# Patient Record
Sex: Male | Born: 1935 | Race: White | Hispanic: No | Smoking: Former smoker
Health system: Southern US, Community
[De-identification: ages and names within clinical notes are randomized; demographics above are authoritative.]

## PROBLEM LIST (undated history)

## (undated) DIAGNOSIS — K219 Gastro-esophageal reflux disease without esophagitis: Secondary | ICD-10-CM

## (undated) DIAGNOSIS — I10 Essential (primary) hypertension: Secondary | ICD-10-CM

## (undated) DIAGNOSIS — C801 Malignant (primary) neoplasm, unspecified: Secondary | ICD-10-CM

## (undated) DIAGNOSIS — G8929 Other chronic pain: Secondary | ICD-10-CM

## (undated) DIAGNOSIS — M1990 Unspecified osteoarthritis, unspecified site: Secondary | ICD-10-CM

## (undated) DIAGNOSIS — E785 Hyperlipidemia, unspecified: Secondary | ICD-10-CM

## (undated) DIAGNOSIS — F329 Major depressive disorder, single episode, unspecified: Secondary | ICD-10-CM

## (undated) DIAGNOSIS — N400 Enlarged prostate without lower urinary tract symptoms: Secondary | ICD-10-CM

## (undated) DIAGNOSIS — F32A Depression, unspecified: Secondary | ICD-10-CM

## (undated) DIAGNOSIS — M549 Dorsalgia, unspecified: Secondary | ICD-10-CM

## (undated) DIAGNOSIS — E119 Type 2 diabetes mellitus without complications: Secondary | ICD-10-CM

## (undated) HISTORY — PX: APPENDECTOMY: SHX54

## (undated) HISTORY — PX: SHOULDER SURGERY: SHX246

## (undated) HISTORY — DX: Depression, unspecified: F32.A

## (undated) HISTORY — DX: Hyperlipidemia, unspecified: E78.5

## (undated) HISTORY — DX: Unspecified osteoarthritis, unspecified site: M19.90

## (undated) HISTORY — PX: EYE SURGERY: SHX253

---
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
## 1998-09-09 ENCOUNTER — Ambulatory Visit (HOSPITAL_COMMUNITY): Admission: RE | Admit: 1998-09-09 | Discharge: 1998-09-09 | Payer: Self-pay | Admitting: *Deleted

## 2000-11-29 ENCOUNTER — Encounter: Payer: Self-pay | Admitting: Orthopedic Surgery

## 2000-11-29 ENCOUNTER — Ambulatory Visit: Admission: RE | Admit: 2000-11-29 | Discharge: 2000-11-29 | Payer: Self-pay | Admitting: Orthopedic Surgery

## 2001-01-20 ENCOUNTER — Ambulatory Visit (HOSPITAL_COMMUNITY): Admission: RE | Admit: 2001-01-20 | Discharge: 2001-01-20 | Payer: Self-pay | Admitting: Orthopedic Surgery

## 2001-01-27 ENCOUNTER — Observation Stay (HOSPITAL_COMMUNITY): Admission: RE | Admit: 2001-01-27 | Discharge: 2001-01-28 | Payer: Self-pay | Admitting: Orthopedic Surgery

## 2001-01-27 ENCOUNTER — Encounter (INDEPENDENT_AMBULATORY_CARE_PROVIDER_SITE_OTHER): Payer: Self-pay | Admitting: Specialist

## 2001-06-06 ENCOUNTER — Ambulatory Visit (HOSPITAL_COMMUNITY): Admission: RE | Admit: 2001-06-06 | Discharge: 2001-06-06 | Payer: Self-pay | Admitting: Ophthalmology

## 2007-09-12 ENCOUNTER — Encounter: Admission: RE | Admit: 2007-09-12 | Discharge: 2007-10-09 | Payer: Self-pay | Admitting: Internal Medicine

## 2008-06-05 ENCOUNTER — Ambulatory Visit: Payer: Self-pay | Admitting: Vascular Surgery

## 2008-12-03 ENCOUNTER — Encounter: Admission: RE | Admit: 2008-12-03 | Discharge: 2008-12-03 | Payer: Self-pay | Admitting: Neurological Surgery

## 2008-12-05 ENCOUNTER — Encounter: Admission: RE | Admit: 2008-12-05 | Discharge: 2009-01-07 | Payer: Self-pay | Admitting: Neurological Surgery

## 2009-06-17 ENCOUNTER — Encounter: Admission: RE | Admit: 2009-06-17 | Discharge: 2009-08-11 | Payer: Self-pay | Admitting: Neurological Surgery

## 2010-07-19 ENCOUNTER — Encounter: Payer: Self-pay | Admitting: Neurological Surgery

## 2010-11-10 NOTE — Procedures (Signed)
CAROTID DUPLEX EXAM   INDICATION:  Possible subclavian stenosis, blood pressure difference  from right to left.   HISTORY:  Diabetes:  No.  Cardiac:  No.  Hypertension:  Yes.  Smoking:  Previous.  Previous Surgery:  No.  CV History:  Asymptomatic.  Amaurosis Fugax No, Paresthesias No, Hemiparesis No                                       RIGHT             LEFT  Brachial systolic pressure:         164               154  Brachial Doppler waveforms:         Within normal limits                Within normal limits  Vertebral direction of flow:        Antegrade         Antegrade  DUPLEX VELOCITIES (cm/sec)  CCA peak systolic                   102               99  ECA peak systolic                   117               119  ICA peak systolic                   58                138  ICA end diastolic                   21                37  PLAQUE MORPHOLOGY:                  Mixed             Mixed  PLAQUE AMOUNT:                      Mild              Mild/moderate  PLAQUE LOCATION:                    ICA/ECA           ICA/ECA   IMPRESSION:  1. 1-39% stenosis of the right internal carotid artery.  2. 40-59% stenosis of the left internal carotid artery.  3. No evidence of increased velocities or abnormal waveform morphology      noted in the bilateral subclavian arteries.  4. The bilateral vertebral arteries are patent and antegrade.  5. No significant difference in the bilateral brachial pressures was      appreciated by use of hand Doppler.  6. Preliminary report was faxed to Dr. Laurey Morale office on 06/05/2008.      ___________________________________________  Larina Earthly, M.D.   CH/MEDQ  D:  06/05/2008  T:  06/05/2008  Job:  [id number]

## 2010-11-13 NOTE — Op Note (Signed)
[facility]  Patient:    Robert Beltran, Robert Beltran                       MRN: [id number] Proc. Date: 01/27/01 Adm. Date:  [id number] Disc. Date: [id number] Attending:  Marlowe Kays Page                           Operative Report  PREOPERATIVE DIAGNOSES: 1. Chronic impingement syndrome with partial and possible small full thickness rotator cuff tears, right shoulder. 2. 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  OPERATION: 1. Right shoulder arthroscopic subacromial decompression. 2. Glenohumeral arthroscopy, right shoulder, with extensive debridement and removal of multiple loose osteocartilaginous loose bodies.  SURGEON: Illene Labrador. Aplington, M.D.  ASSISTANT:  ______, PA-C.  ANESTHESIA:  General.  PATHOLOGIST:  ______  INDICATIONS:  He has pain and limitation of motion of his right shoulder. Plain x-rays demonstrated moderate glenohumeral arthritis with multiple fragments in and about the shoulder joint.  This was associated with a downsloping acromion and symptoms of rotator cuff disease.  An MRI has demonstrated rotator cuff tendinopathy with possible small full thickness tears and multiple synovial osteochondromatosis.  He is here today hopefully to do everything arthroscopically in terms of the above-mentioned surgery.  PROCEDURE:  After satisfactory general anesthesia in the beach chair position on the ______ frame, the right shoulder girdle was prepped with Duraprep and draped in a sterile field.  The anatomy of the shoulder was marked out with a marking pen, and a lateral portal, posterior soft spot portal, and subacromial space were all infiltrated with 0.5% Marcaine with adrenalin.  First through a posterior soft spot portal, I was able to atraumatically enter the glenohumeral joint.  Visualization was thoroughly obscured because of the fragments and all of the synovitis.  No fluid exited up into the  subacromial space, however, so I felt comfortable that we did not have any full thickness rotator cuff tears.  I consequently redirected the scope into the subacromial area and through the lateral portal introduced a 4.2 shaver and then began removing subdeltoid bursa, and when I had adequate visualization, brought in the Arthrocare vaporizer and removed soft tissue on the undersurface of the acromion and coracoacromial ligament.  I then introduced a 4-0 bur and began burring down not only the subacromial, but also the underneath surface of the distal clavicle, which appeared to be impinging on the rotator cuff.  I alternated back and forth between the bur, the Arthrocare vaporizer, and the 4.2 shaver until I felt we had thorough decompression of the subacromial joint.  Pictures were taken confirming this with the arm both to the side and in abduction.  I then evacuated fluid from the subacromial joint and then reinserted the scope into the glenohumeral joint.  Because I could not discern the normal anatomy, I simply advanced the scope to the anterior capsule, used a switching stick, and made an anterior portal there, and using rubber cannula introduced a 5-0 shaver and began debriding up the joint. We noticed the differentiation of the pathology with multiple osteochondral fragments in the joint, but probably totalling no more than about 6 with complete full thickness wear of the glenoid and moderate wear of the humeral head.  Most of the fragments seen on the MRI were clearly in the lining of the capsule and not creating a mechanical problem.  Consequently, I felt that our goal today was try  and remove all fragments which might be interfering with the glenohumeral function.  I tried various ways to grasp the bodies, some of which were several cm in size.  These were very firm and clearly bony with cartilaginous outer shell, and I could not bring them out through the anterior cannula and  could not fragment them, so I then introduced a bur and began burring them down with the difficulty being trying to find a solid surface to trap the fragments.  I piecemealed the fragments out as I fragmented them and removed the fragments with a pituitary.  We got down to the last several fragments, which I was having a difficult time fragmenting, and I then elected simply to remove the cannula anteriorly and remove the fragments through the enlarged anterior portal with long pituitaries.  At the conclusion of the case, I felt that all fragments from the glenohumeral joint which might be impinging and were loose had been completely removed.  Confirmatory pictures were taken.  Fluid was evacuated.  The posterior and lateral portals were closed with 4-0 nylon and the larger anterior portal with 3-0 nylon.  Betadine and Adaptic dressings and shoulder immobilizer were applied, and he tolerated the procedure well.  He returned to the recovery room in satisfactory condition with no known complications. DD:  01/27/01 TD:  01/30/01 Job: [id number]

## 2010-11-13 NOTE — Op Note (Signed)
[place]  Patient:    Robert Beltran, Robert Beltran Visit Number: [id number] MRN: [id number]          Service Type: DSU Location: [facility] 2899 [id number] Attending Physician:  Ivor Messier Dictated by:   Guadelupe Sabin, M.D. Proc. Date: 06/06/01 Admit Date:  06/06/2001   CC:         Rodrigo Ran, M.D., and any associated laboratory data, EKG,                           Operative Report  PREOPERATIVE DIAGNOSIS:  Senile brunescent nuclear cataract, left eye.  POSTOPERATIVE DIAGNOSIS:  Senile brunescent nuclear cataract, left eye.  PROCEDURE:  Planned extracapsular cataract extraction, phacoemulsification, primary insertion of posterior chamber intraocular lens implant.  SURGEON:  Guadelupe Sabin, M.D.  ASSISTANT:  Nurse.  ANESTHESIA:  Local 4% Xylocaine, 0.75% Marcaine retrobulbar block, topical Tetracaine, intraocular Xylocaine.  Anesthesia standby required.  Patient given sodium pentothal intravenously during the period of retrobulbar injection.  DESCRIPTION OF PROCEDURE:  After the patient was prepped and draped, a lid speculum was inserted in the left eye.  Schiotz tonometry was recorded at 6-7 scale units with a 5.5 g weight.  A peritomy was performed adjacent to the limbus from the 11 to 1 oclock position.  The corneoscleral junction was cleaned and a corneoscleral groove made with a 45 degree Superblade.  The anterior chamber was then entered with the 2.5 mm diamond keratome at the 12 oclock position and the 15 degree blade at the 2:30 position.  Using a bent 26-gauge needle on a Healon syringe, a circular capsulorhexis was begun and then completed with the Grabow forceps.  Hydrodissection and hydrodelineation were performed using 1% Xylocaine.  The lens nucleus was then flipped vertically in the anterior chamber and pupillary aperture.  The 30 degree phacoemulsification tip was then inserted with slow, controlled emulsification of the  extremely firm brunescent nucleus.  Lens chopping was required with the [facility] pick behind the lens, fragmenting it into smaller and smaller pieces. Following removal of nucleus, the small amount of residual cortex was aspirated with the irrigation-aspiration tip.  The posterior capsule appeared intact with a brilliant red fundus reflex.  It was therefore elected to insert an [employer] [id number] silicone three-piece posterior chamber intraocular lens implant with UV absorber.  Diopter strength +19.00.  This was inserted with a McDonald forceps in the anterior chamber and then centered into the capsular bag using the [facility] lens rotator.  The lens appeared to be well-centered.  The Healon which had been used during the procedure was then aspirated and replaced with balanced salt solution and Miochol ophthalmic solution.  The operative incisions appeared to be self-sealing, and no sutures were required.  A light patch and protective shield were applied to the operated left eye.  Duration of procedure and anesthesia administration 45 minutes.  The patient tolerated the procedure well in general, left the operating room for the recovery room in good condition. Dictated by:   Guadelupe Sabin, M.D. Attending Physician:  Ivor Messier DD:  06/06/01 TD:  06/06/01 Job: [id number]

## 2010-11-13 NOTE — H&P (Signed)
[facility]  Patient:    Robert, Beltran                         MRN: [id number] Adm. Date:  12/05/00 Attending:  Fayrene Fearing P. Aplington, M.D. Dictator:   Colleen P. Mahar, P.A. CC:         Rodrigo Ran, M.D.   History and Physical  DATE OF BIRTH:  [birth year]  CHIEF COMPLAINT:  Right shoulder pain.  HISTORY OF PRESENT ILLNESS:  The patient has had 8-10 year history of right shoulder pain which began when lifting a heavy object.  It has come and gone with his pain and disability over the 8-10 years.  However, earlier this year, in about January or February, it has become more consistent with increasing pain with significant pain whenever he raises his arm above his head, particularly with abduction of the arm.  He states that as long as his elbow is at his side he gets along fine.  An MRI of his right shoulder on Nov 01, 2000, demonstrated advanced glenohumeral degenerative disease, but also the joint was found to be packed with intra-articular loose bodies consistent with secondary osteochondromatosis.  He has arthropathy of the rotator cuff with partial thickness tear and possibly a few small full thickness tears, but no retraction.  The patient discussed at length with Dr. Simonne Come his treatment options at this point, and they were in agreement that jumping into a hemiarthroplasty initially was not the best option, and they should start with an arthroscopy of the shoulder to allow Dr. Simonne Come to shave down the rotator cuff, perform a subacromial decompression, and possibly remove all the loose bodies.  The risks were discussed with the patient and his wife, and they both understood and wanted to proceed with the shoulder arthroscopy.  PAST MEDICAL HISTORY:  Significant for hypercholesterolemia and benign prostate hypertrophy.  PAST SURGICAL HISTORY:  Significant for testicular cancer in 1964, appendectomy in 1995, and tonsillectomy at age  44.  SOCIAL HISTORY:  The patient denies tobacco use.  Alcohol:  Drinks approximately one glass of red wine per day.  The patient is married.  FAMILY HISTORY:  The patients mother deceased at age 24 from coronary artery disease.  Father is deceased at age 63 with history of MI and coronary artery disease.  ALLERGIES:  The patient cannot tolerate ASPIRIN or NSAIDS.  He has a history of GI bleed secondary to the use of these.  MEDICATIONS: 1. Lopid 600 mg 1 tablet b.i.d. 2. Lipitor 10 mg 1 tablet q.h.s. 3. Wellbutrin SR 150 mg 1 tablet q.d.  REVIEW OF SYSTEMS:  GENERAL:  Negative for fevers or chills, night sweats, or bleeding tendencies.  CNS:  Negative for blurred vision, double vision, seizures, headaches, or paralysis.  PULMONARY:  Negative for shortness of breath, productive cough, or hemoptysis.  CARDIOVASCULAR:  Negative for chest pain, angina, orthopnea, or claudication.  GI:  Negative for nausea or vomiting, constipation, diarrhea, melena, or bloody stool.  GU:  Negative for dysuria, hematuria, discharge.  MUSCULOSKELETAL:  As per HPI.  PHYSICAL EXAMINATION:  VITAL SIGNS:  Blood pressure 140/78, respirations 16 and unlabored, and pulse 64 and regular.  GENERAL:  A 77 year old male who is alert and oriented, pleasant, and cooperative to the exam and in no acute distress.  HEENT:  Head is normocephalic, nontraumatic.  Extraocular movement is intact.  NECK:  Soft to palpation and no bruits are appreciated.  LUNGS:  Clear to auscultation bilaterally.  No rales, rhonchi, stridor, wheezes, or friction rubs.  BREASTS:  Not performed.  HEART:  Normal S1, S2, with regular rate and rhythm.  However, I did appreciate a 1-2/6 blowing-type murmur heard best at the fifth intercostal space midclavicular line, which the patient denied having a history of.  ABDOMEN:  Positive bowel sounds throughout.  Soft to palpation.  No masses appreciated.  Nontender, nondistended.  GU:   Not performed, not pertinent.  EXTREMITIES:  The right shoulder has a decreased range of motion secondary to pain above 90 degrees and also with some weakness to the right shoulder during active range of motion.  Right shoulder has a limited range of motion. External rotation is limited to about 10-15 degrees.  Internal rotation is also limited.  He is able to put his hands behind his head, but it is more difficult on the right.  He has internal rotation to about his belt line compared to his left asymptomatic side, which is about T6 level.  Drop arm test is negative bilaterally.  Isolated supraspinatus muscle testing does cause a familiar right shoulder pain.  Reflexes are 2+ to both upper extremities.  SKIN:  Intact with no erythema or rashes present.  IMPRESSION: 1. Right shoulder osteoarthritis with loose bodies, osteochondromatosis, and    a partial thickness rotator cuff tear. 2. Hypercholesterolemia. 3. Benign prostatic hypertrophy. 4. Mild depression.  PLAN:  Admit to [facility] on December 05, 2000, to Dr. Simonne Come for a right shoulder arthroscopy with shaving of rotator cuff, removal of loose bodies, and subacromial decompression arthroscopically.  I contacted the patients primary care physician, who is Dr. Rodrigo Ran, and I spoke with a nurse there that works with him and informed her about the murmur that I appreciated during the physical examination.  I asked them what they would like to do for followup for this patient.  They contacted the patient and they are going to follow up with an EKG and workup his heart murmur prior to surgery and prior to medical clearance, so this clearance is pending.  Will fax it to [facility] upon receiving that. DD:  11/29/00 TD:  11/29/00 Job: [id number]

## 2010-11-13 NOTE — H&P (Signed)
[facility]  Patient:    Robert Beltran, Robert Beltran                         MRN: [id number] Adm. Date:  01/27/01 Attending:  Fayrene Fearing P. Aplington, M.D. Dictator:   Druscilla Brownie. Shela Nevin, P.A. CC:         Peter M. [place], M.D.  Rodrigo Ran, M.D.   History and Physical  ADDENDUM  DATE OF BIRTH:  Nov 07, 1923  As the patient was noticed to have a new diagnosed murmur on his history and physical of December 05, 2000, it was felt that he had a complete cardiac workup. He saw Dr. Peter M. [place], as well as Dr. Rodrigo Ran, and a cardiac and medical workup was obtained.  A Cardiolite profusion study was normal and he had normal left ventricular function; however, he did have new onset of a new left bundle branch block by EKG.  With this clearance, he is scheduled to go ahead with the surgery of right shoulder arthroscopy and shaving the rotator cuff, subacromial decompression, removal of loose bodies, and probable arthrotomy. DD:  01/17/01 TD:  01/17/01 Job: [id number]

## 2010-11-13 NOTE — H&P (Signed)
[place]  Patient:    Robert Beltran, Robert Beltran Visit Number: [id number] MRN: [id number]          Service Type: DSU Location: [facility] 2899 21 Attending Physician:  Ivor Messier Dictated by:   Guadelupe Sabin, M.D. Admit Date:  06/06/2001   CC:         Rodrigo Ran, M.D., and any associated laboratory data, EKG,                         History and Physical  REASON FOR ADMISSION:  This was a planned outpatient surgical admission of this 77 year old white male admitted for cataract implant surgery of the left eye.  HISTORY OF PRESENT ILLNESS:  This patient and his family have been followed in my office for many years.  The patient was first seen on January 16, 1996, at which time a brunescent nuclear cataract formation was noted in both eyes. Visual acuity, however, remained good at 20/40 right eye, 20/30 left eye.  The patient was followed intermittently since that time with refraction changes. When he recently returned to the office, it was felt that the vision had deteriorated and vision had decreased to 20/50 -2 in the left eye.  The brunescent nucleus was felt to be extremely firm in nature, and cataract surgery was suggested and indicated.  The patient agreed and signed an informed consent and arrangements made for his outpatient admission at this time.  PAST MEDICAL HISTORY:  The patient is under the care of Dr. Waynard Edwards.  The patient is felt to be in stable general condition.  MEDICATIONS:  The patient currently is taking saw palmetto, Wellbutrin, Lopid, Lipitor, vitamin E, and MVI under Dr. Therisa Doyne instruction.  REVIEW OF SYSTEMS:  No cardiorespiratory complaints.  PHYSICAL EXAMINATION:  VITAL SIGNS:  As recorded on admission, blood pressure 158/79, temperature 97.2, pulse 60, respirations 18.  GENERAL:  The patient is a pleasant, well-nourished, well-developed 77 year old white male in no acute distress.  HEENT:  Eyes:  External  ocular:  The eyes are white and clear.  Slit lamp examination:  Bilateral brunescent nuclear cataract formation.  Detailed fundus examination:  Clear vitreous, right eye and left eye.  Retina is attached.  Normal optic nerve, blood vessels, and macula.  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  CHEST:  Lungs clear to percussion and auscultation.  CARDIAC:  Normal sinus rhythm.  No cardiomegaly, no murmurs.  ABDOMEN:  Negative.  EXTREMITIES:  Negative.  ADMISSION DIAGNOSIS:  Senile cataract, left and right eye.  SURGICAL PLAN:  Cataract implant surgery left eye now, right eye later. Dictated by:   Guadelupe Sabin, M.D. Attending Physician:  Ivor Messier DD:  06/06/01 TD:  06/06/01 Job: [id number]

## 2012-07-24 ENCOUNTER — Other Ambulatory Visit: Payer: Self-pay | Admitting: Neurological Surgery

## 2012-07-24 DIAGNOSIS — R269 Unspecified abnormalities of gait and mobility: Secondary | ICD-10-CM

## 2012-07-24 DIAGNOSIS — M48061 Spinal stenosis, lumbar region without neurogenic claudication: Secondary | ICD-10-CM

## 2012-07-31 ENCOUNTER — Ambulatory Visit
Admission: RE | Admit: 2012-07-31 | Discharge: 2012-07-31 | Disposition: A | Discharge status: Home / Self Care | Payer: Medicare Other | Source: Ambulatory Visit | Attending: Neurological Surgery | Admitting: Neurological Surgery

## 2012-07-31 DIAGNOSIS — M48061 Spinal stenosis, lumbar region without neurogenic claudication: Secondary | ICD-10-CM

## 2012-07-31 DIAGNOSIS — R269 Unspecified abnormalities of gait and mobility: Secondary | ICD-10-CM

## 2012-08-07 ENCOUNTER — Other Ambulatory Visit: Payer: Self-pay | Admitting: Neurological Surgery

## 2012-08-11 ENCOUNTER — Institutional Professional Consult (permissible substitution): Payer: Medicare Other | Admitting: Cardiovascular Disease

## 2012-08-21 ENCOUNTER — Ambulatory Visit (INDEPENDENT_AMBULATORY_CARE_PROVIDER_SITE_OTHER): Payer: Medicare Other | Admitting: Cardiovascular Disease

## 2012-08-21 ENCOUNTER — Encounter: Payer: Self-pay | Admitting: *Deleted

## 2012-08-21 VITALS — BP 117/59 | HR 70 | Ht 66.0 in | Wt 170.0 lb

## 2012-08-21 DIAGNOSIS — Z01818 Encounter for other preprocedural examination: Secondary | ICD-10-CM

## 2012-08-21 DIAGNOSIS — I447 Left bundle-branch block, unspecified: Secondary | ICD-10-CM

## 2012-08-21 NOTE — Assessment & Plan Note (Signed)
Inocente presents today for preoperative evaluation. He does not have any cardiac complaints but does have a left bundle branch block.  At this point I would like to do an echocardiogram. If he has well-preserved left ventricular systolic function, then I think that he can safely proceed with his back surgery at minimal risk. If he has significant left ventricular dysfunction then we may need to perform further tests including a stress Myoview study. I'll see him back on an as-needed basis depending on the results of the echocardiogram.

## 2012-08-21 NOTE — Progress Notes (Signed)
     Robert Beltran Date of Birth  March 04, 1924       [facility]    [place] [address]  [address] [place], [place]  [address]   [place], [place]  [address] [phone]     [phone]   Fax  [phone]    Fax [phone]  Problem List: 1. Left bundle branch block 2. Spinal stenosis  History of Present Illness:  Robert Beltran is an 79 yo who presented for pre-op evaluation prior having back surgery. He has a left bundle branch block at baseline. He's never had episodes of chest pain or shortness of breath. He is retired from [employer].  He's quite limited by his back and leg pain. He has spinal stenosis which limits him. Prior to the acute worsening of his spinal stenosis he was exercising 2-3 times a week a regular basis.  No current outpatient prescriptions on file prior to visit.   No current facility-administered medications on file prior to visit.    Allergies not on file  No past medical history on file.  No past surgical history on file.  History  Smoking status  . Former Smoker  Smokeless tobacco  . Not on file    History  Alcohol Use: Not on file    No family history on file.  Reviw of Systems:  Reviewed in the HPI.  All other systems are negative.  Physical Exam: Blood pressure 117/59, pulse 70, height 5\' 6"  (1.676 m), weight 170 lb (77.111 kg). General: Well developed, well nourished, in no acute distress.  He was examined in the wheelchair.  Head: Normocephalic, atraumatic, sclera non-icteric, mucus membranes are moist,   Neck: Supple. Carotids are 2 + without bruits. No JVD   Lungs: Clear   Heart: Regular rate, normal S1 and normal S2  Abdomen: Soft, non-tender, non-distended with normal bowel sounds.  Msk:  Strength and tone are normal   Extremities: No clubbing or cyanosis. No edema.  Distal pedal pulses are 2+ and equal    Neuro: CN II - XII intact.  Alert and oriented X 3.   Psych:   Normal   ECG: Feb. 24, 2014:  NSR at 73.  LBBB.  Assessment / Plan:

## 2012-08-21 NOTE — Patient Instructions (Addendum)
Your physician has requested that you have an echocardiogram. Echocardiography is a painless test that uses sound waves to create images of your heart. It provides your doctor with information about the size and shape of your heart and how well your heart's chambers and valves are working. This procedure takes approximately one hour. There are no restrictions for this procedure.   Your physician recommends that you schedule a follow-up appointment in: AS NEEDED 

## 2012-08-23 ENCOUNTER — Encounter (HOSPITAL_COMMUNITY): Payer: Self-pay | Admitting: Pharmacy Technician

## 2012-08-25 ENCOUNTER — Ambulatory Visit (HOSPITAL_COMMUNITY): Payer: Medicare Other | Attending: Cardiovascular Disease

## 2012-08-25 DIAGNOSIS — Z01818 Encounter for other preprocedural examination: Secondary | ICD-10-CM | POA: Insufficient documentation

## 2012-08-25 DIAGNOSIS — I447 Left bundle-branch block, unspecified: Secondary | ICD-10-CM

## 2012-08-25 MED ORDER — PERFLUTREN PROTEIN A MICROSPH IV SUSP
2.0000 mL | Freq: Once | INTRAVENOUS | Status: AC
  Administered 2012-08-25: 2 mL via INTRAVENOUS

## 2012-08-25 NOTE — Progress Notes (Addendum)
Echocardiogram performed with [employer].  

## 2012-08-25 NOTE — Progress Notes (Signed)
Echocardiogram performed.  

## 2012-08-25 NOTE — Addendum Note (Signed)
Addended by: Oren Bracket on: 08/25/2012 04:25 PM   Modules accepted: Orders

## 2012-08-28 ENCOUNTER — Encounter (HOSPITAL_COMMUNITY): Payer: Self-pay

## 2012-08-28 MED ORDER — CEFAZOLIN SODIUM-DEXTROSE 2-3 GM-% IV SOLR
2.0000 g | INTRAVENOUS | Status: AC
  Administered 2012-08-29: 2 g via INTRAVENOUS

## 2012-08-28 MED ORDER — DEXAMETHASONE SODIUM PHOSPHATE 10 MG/ML IJ SOLN: 10.0000 mg | INTRAMUSCULAR | Status: DC

## 2012-08-28 NOTE — Progress Notes (Signed)
Anesthesia chart review: Patient is a 80 year old male posted for a 2 level lumbar laminectomy/decompression/microdiskectomy on 08/29/12 by Dr. Yetta Barre.  He is scheduled to be a same day work-up.   No history is currently listed, but he was seen by Cardiologist Dr. Elease Hashimoto for a pre-operative evaluation on 08/21/12.  EKG that day showed NSR, left BBB.  He ordered a preoperative echocardiogram with the following thoughts, "If he has well-preserved left ventricular systolic function, then I think that he can safely proceed with his back surgery at minimal risk. If he has significant left ventricular dysfunction then we may need to perform further tests including a stress Myoview study. I'll see him back on an as-needed basis depending on the results of the echocardiogram."  The echo was just performed on 08/25/12.  Results showed: - Left ventricle: WIth Definity LVEF appears normal at 60%. The cavity size was mildly reduced. Wall thickness was increased in a pattern of mild LVH. Doppler parameters are consistent with abnormal left ventricular relaxation (grade 1 diastolic dysfunction). - Aortic valve: Trivial regurgitation. - Mitral valve: Calcified annulus. Mildly thickened leaflets. Mild regurgitation. [facility] closed early today due to inclement weather.  Erie Noe at [facility] plans to page [facility] card master for additional input, but based on normal EF and Dr. Harvie Bridge previous assessment/recommendations it would appear that patient meets Dr. Harvie Bridge criteria to proceed with surgery--pending other pre-operative testing results are unremarkable.    Shonna Chock, PA-C 08/28/12 1541

## 2012-08-29 ENCOUNTER — Inpatient Hospital Stay (HOSPITAL_COMMUNITY): Payer: Medicare Other

## 2012-08-29 ENCOUNTER — Encounter (HOSPITAL_COMMUNITY)
Admission: RE | Disposition: A | Discharge status: Skilled Nursing Facility | Payer: Self-pay | Source: Ambulatory Visit | Attending: Neurological Surgery

## 2012-08-29 ENCOUNTER — Encounter (HOSPITAL_COMMUNITY): Payer: Self-pay | Admitting: Neurological Surgery

## 2012-08-29 ENCOUNTER — Encounter (HOSPITAL_COMMUNITY): Payer: Self-pay | Admitting: Vascular Surgery

## 2012-08-29 ENCOUNTER — Inpatient Hospital Stay (HOSPITAL_COMMUNITY)
Admission: RE | Admit: 2012-08-29 | Discharge: 2012-09-07 | DRG: 491 | Disposition: A | Discharge status: Skilled Nursing Facility | Payer: Medicare Other | Source: Ambulatory Visit | Attending: Neurological Surgery | Admitting: Neurological Surgery

## 2012-08-29 ENCOUNTER — Inpatient Hospital Stay (HOSPITAL_COMMUNITY): Payer: Medicare Other | Admitting: Vascular Surgery

## 2012-08-29 DIAGNOSIS — M4714 Other spondylosis with myelopathy, thoracic region: Principal | ICD-10-CM | POA: Diagnosis present

## 2012-08-29 DIAGNOSIS — Z79899 Other long term (current) drug therapy: Secondary | ICD-10-CM

## 2012-08-29 DIAGNOSIS — Z8547 Personal history of malignant neoplasm of testis: Secondary | ICD-10-CM

## 2012-08-29 DIAGNOSIS — E119 Type 2 diabetes mellitus without complications: Secondary | ICD-10-CM | POA: Diagnosis present

## 2012-08-29 DIAGNOSIS — K219 Gastro-esophageal reflux disease without esophagitis: Secondary | ICD-10-CM | POA: Diagnosis present

## 2012-08-29 DIAGNOSIS — N400 Enlarged prostate without lower urinary tract symptoms: Secondary | ICD-10-CM | POA: Diagnosis present

## 2012-08-29 DIAGNOSIS — I10 Essential (primary) hypertension: Secondary | ICD-10-CM | POA: Diagnosis present

## 2012-08-29 DIAGNOSIS — M4804 Spinal stenosis, thoracic region: Secondary | ICD-10-CM

## 2012-08-29 HISTORY — DX: Benign prostatic hyperplasia without lower urinary tract symptoms: N40.0

## 2012-08-29 HISTORY — DX: Dorsalgia, unspecified: M54.9

## 2012-08-29 HISTORY — DX: Essential (primary) hypertension: I10

## 2012-08-29 HISTORY — DX: Other chronic pain: G89.29

## 2012-08-29 HISTORY — DX: Type 2 diabetes mellitus without complications: E11.9

## 2012-08-29 HISTORY — DX: Unspecified osteoarthritis, unspecified site: M19.90

## 2012-08-29 HISTORY — DX: Gastro-esophageal reflux disease without esophagitis: K21.9

## 2012-08-29 HISTORY — DX: Malignant (primary) neoplasm, unspecified: C80.1

## 2012-08-29 HISTORY — PX: LUMBAR LAMINECTOMY/DECOMPRESSION MICRODISCECTOMY: SHX5026

## 2012-08-29 LAB — CBC WITH DIFFERENTIAL/PLATELET
Basophils Absolute: 0 10*3/uL (ref 0.0–0.1)
Eosinophils Relative: 1 % (ref 0–5)
Lymphocytes Relative: 12 % (ref 12–46)
Lymphs Abs: 1 10*3/uL (ref 0.7–4.0)
MCV: 94.3 fL (ref 78.0–100.0)
Neutro Abs: 7.3 10*3/uL (ref 1.7–7.7)
Platelets: 168 10*3/uL (ref 150–400)
RBC: 5.05 MIL/uL (ref 4.22–5.81)
RDW: 14.5 % (ref 11.5–15.5)
WBC: 9.1 10*3/uL (ref 4.0–10.5)

## 2012-08-29 LAB — GLUCOSE, CAPILLARY: Glucose-Capillary: 125 mg/dL — ABNORMAL HIGH (ref 70–99)

## 2012-08-29 LAB — BASIC METABOLIC PANEL
CO2: 25 mEq/L (ref 19–32)
Calcium: 9.1 mg/dL (ref 8.4–10.5)
Chloride: 101 mEq/L (ref 96–112)
GFR calc Af Amer: 62 mL/min — ABNORMAL LOW (ref >90)
Sodium: 137 mEq/L (ref 135–145)

## 2012-08-29 LAB — PROTIME-INR
INR: 0.94 (ref 0.00–1.49)
Prothrombin Time: 12.5 seconds (ref 11.6–15.2)

## 2012-08-29 LAB — SURGICAL PCR SCREEN
MRSA, PCR: NEGATIVE
Staphylococcus aureus: NEGATIVE

## 2012-08-29 SURGERY — LUMBAR LAMINECTOMY/DECOMPRESSION MICRODISCECTOMY 2 LEVELS
Anesthesia: General | Site: Back | Wound class: Clean

## 2012-08-29 MED ORDER — DEXAMETHASONE SODIUM PHOSPHATE 10 MG/ML IJ SOLN
INTRAMUSCULAR | Status: DC | PRN
  Administered 2012-08-29: 10 mg via INTRAVENOUS

## 2012-08-29 MED ORDER — MORPHINE SULFATE 2 MG/ML IJ SOLN
2.0000 mg | INTRAMUSCULAR | Status: DC | PRN
  Administered 2012-08-29 – 2012-08-31 (×3): 2 mg via INTRAVENOUS
  Filled 2012-08-29 (×3): qty 1

## 2012-08-29 MED ORDER — PHENOL 1.4 % MT LIQD: 1.0000 | OROMUCOSAL | Status: DC | PRN

## 2012-08-29 MED ORDER — MUPIROCIN 2 % EX OINT
TOPICAL_OINTMENT | CUTANEOUS | Status: AC
  Administered 2012-08-29 (×2): 1
  Filled 2012-08-29: qty 22

## 2012-08-29 MED ORDER — ROCURONIUM BROMIDE 100 MG/10ML IV SOLN
INTRAVENOUS | Status: DC | PRN
  Administered 2012-08-29: 50 mg via INTRAVENOUS

## 2012-08-29 MED ORDER — ONDANSETRON HCL 4 MG/2ML IJ SOLN
INTRAMUSCULAR | Status: DC | PRN
  Administered 2012-08-29: 4 mg via INTRAVENOUS

## 2012-08-29 MED ORDER — 0.9 % SODIUM CHLORIDE (POUR BTL) OPTIME
TOPICAL | Status: DC | PRN
  Administered 2012-08-29: 1000 mL

## 2012-08-29 MED ORDER — MIDAZOLAM HCL 5 MG/5ML IJ SOLN
INTRAMUSCULAR | Status: DC | PRN
  Administered 2012-08-29: 1 mg via INTRAVENOUS

## 2012-08-29 MED ORDER — CEFAZOLIN SODIUM-DEXTROSE 2-3 GM-% IV SOLR
INTRAVENOUS | Status: AC
  Filled 2012-08-29: qty 50

## 2012-08-29 MED ORDER — CEFAZOLIN SODIUM 1-5 GM-% IV SOLN
1.0000 g | Freq: Three times a day (TID) | INTRAVENOUS | Status: AC
  Administered 2012-08-29 – 2012-08-30 (×2): 1 g via INTRAVENOUS
  Filled 2012-08-29 (×2): qty 50

## 2012-08-29 MED ORDER — BACITRACIN 50000 UNITS IM SOLR
INTRAMUSCULAR | Status: AC
  Filled 2012-08-29: qty 1

## 2012-08-29 MED ORDER — METFORMIN HCL 500 MG PO TABS
500.0000 mg | ORAL_TABLET | Freq: Two times a day (BID) | ORAL | Status: DC
  Administered 2012-08-29 – 2012-09-07 (×18): 500 mg via ORAL
  Filled 2012-08-29 (×23): qty 1

## 2012-08-29 MED ORDER — ACETAMINOPHEN 650 MG RE SUPP: 650.0000 mg | RECTAL | Status: DC | PRN

## 2012-08-29 MED ORDER — HEMOSTATIC AGENTS (NO CHARGE) OPTIME
TOPICAL | Status: DC | PRN
  Administered 2012-08-29: 1 via TOPICAL

## 2012-08-29 MED ORDER — HYDROCODONE-ACETAMINOPHEN 5-325 MG PO TABS
ORAL_TABLET | ORAL | Status: AC
  Filled 2012-08-29: qty 2

## 2012-08-29 MED ORDER — SODIUM CHLORIDE 0.9 % IJ SOLN
3.0000 mL | Freq: Two times a day (BID) | INTRAMUSCULAR | Status: DC
  Administered 2012-08-29 – 2012-09-04 (×12): 3 mL via INTRAVENOUS

## 2012-08-29 MED ORDER — HYDROMORPHONE HCL PF 1 MG/ML IJ SOLN
0.2500 mg | INTRAMUSCULAR | Status: DC | PRN
  Administered 2012-08-29 (×2): 0.5 mg via INTRAVENOUS

## 2012-08-29 MED ORDER — CHLORTHALIDONE 25 MG PO TABS
25.0000 mg | ORAL_TABLET | Freq: Every day | ORAL | Status: DC
  Administered 2012-08-29 – 2012-09-07 (×10): 25 mg via ORAL
  Filled 2012-08-29 (×10): qty 1

## 2012-08-29 MED ORDER — ONDANSETRON HCL 4 MG/2ML IJ SOLN: 4.0000 mg | INTRAMUSCULAR | Status: DC | PRN

## 2012-08-29 MED ORDER — SODIUM CHLORIDE 0.9 % IR SOLN
Status: DC | PRN
  Administered 2012-08-29: 08:00:00

## 2012-08-29 MED ORDER — SODIUM CHLORIDE 0.9 % IJ SOLN: 3.0000 mL | INTRAMUSCULAR | Status: DC | PRN

## 2012-08-29 MED ORDER — THROMBIN 5000 UNITS EX SOLR
OROMUCOSAL | Status: DC | PRN
  Administered 2012-08-29: 08:00:00 via TOPICAL

## 2012-08-29 MED ORDER — SENNA 8.6 MG PO TABS
1.0000 | ORAL_TABLET | Freq: Two times a day (BID) | ORAL | Status: DC
  Administered 2012-08-29 – 2012-09-04 (×12): 8.6 mg via ORAL
  Filled 2012-08-29 (×19): qty 1

## 2012-08-29 MED ORDER — PANTOPRAZOLE SODIUM 40 MG PO TBEC
40.0000 mg | DELAYED_RELEASE_TABLET | Freq: Every day | ORAL | Status: DC
  Administered 2012-08-29 – 2012-09-07 (×9): 40 mg via ORAL
  Filled 2012-08-29 (×9): qty 1

## 2012-08-29 MED ORDER — TRAMADOL HCL 50 MG PO TABS
50.0000 mg | ORAL_TABLET | Freq: Four times a day (QID) | ORAL | Status: DC | PRN
  Administered 2012-08-29 – 2012-08-30 (×2): 50 mg via ORAL
  Filled 2012-08-29 (×2): qty 1

## 2012-08-29 MED ORDER — ACETAMINOPHEN 10 MG/ML IV SOLN
1000.0000 mg | Freq: Four times a day (QID) | INTRAVENOUS | Status: AC
  Administered 2012-08-29 – 2012-08-30 (×4): 1000 mg via INTRAVENOUS
  Filled 2012-08-29 (×4): qty 100

## 2012-08-29 MED ORDER — FENTANYL CITRATE 0.05 MG/ML IJ SOLN
INTRAMUSCULAR | Status: DC | PRN
  Administered 2012-08-29: 50 ug via INTRAVENOUS
  Administered 2012-08-29: 150 ug via INTRAVENOUS
  Administered 2012-08-29: 50 ug via INTRAVENOUS

## 2012-08-29 MED ORDER — LACTATED RINGERS IV SOLN
INTRAVENOUS | Status: DC | PRN
  Administered 2012-08-29 (×2): via INTRAVENOUS

## 2012-08-29 MED ORDER — ROPINIROLE HCL 0.25 MG PO TABS
0.2500 mg | ORAL_TABLET | Freq: Every day | ORAL | Status: DC | PRN
  Administered 2012-08-29: 1 mg via ORAL
  Filled 2012-08-29 (×2): qty 4

## 2012-08-29 MED ORDER — LIDOCAINE HCL (CARDIAC) 20 MG/ML IV SOLN
INTRAVENOUS | Status: DC | PRN
  Administered 2012-08-29: 60 mg via INTRAVENOUS

## 2012-08-29 MED ORDER — GLYCOPYRROLATE 0.2 MG/ML IJ SOLN
INTRAMUSCULAR | Status: DC | PRN
  Administered 2012-08-29: 0.6 mg via INTRAVENOUS

## 2012-08-29 MED ORDER — MENTHOL 3 MG MT LOZG: 1.0000 | LOZENGE | OROMUCOSAL | Status: DC | PRN

## 2012-08-29 MED ORDER — DEXAMETHASONE SODIUM PHOSPHATE 10 MG/ML IJ SOLN
INTRAMUSCULAR | Status: AC
  Filled 2012-08-29: qty 1

## 2012-08-29 MED ORDER — RAMIPRIL 10 MG PO CAPS
10.0000 mg | ORAL_CAPSULE | Freq: Every day | ORAL | Status: DC
  Administered 2012-08-29 – 2012-09-07 (×9): 10 mg via ORAL
  Filled 2012-08-29 (×10): qty 1

## 2012-08-29 MED ORDER — PHENYLEPHRINE HCL 10 MG/ML IJ SOLN
INTRAMUSCULAR | Status: DC | PRN
  Administered 2012-08-29 (×4): 40 ug via INTRAVENOUS

## 2012-08-29 MED ORDER — POTASSIUM CHLORIDE IN NACL 20-0.9 MEQ/L-% IV SOLN
INTRAVENOUS | Status: DC
  Administered 2012-08-29 – 2012-08-30 (×2): via INTRAVENOUS
  Filled 2012-08-29 (×13): qty 1000

## 2012-08-29 MED ORDER — NEOSTIGMINE METHYLSULFATE 1 MG/ML IJ SOLN
INTRAMUSCULAR | Status: DC | PRN
  Administered 2012-08-29: 4 mg via INTRAVENOUS

## 2012-08-29 MED ORDER — ACETAMINOPHEN 325 MG PO TABS: 650.0000 mg | ORAL_TABLET | ORAL | Status: DC | PRN

## 2012-08-29 MED ORDER — ALBUMIN HUMAN 5 % IV SOLN
INTRAVENOUS | Status: DC | PRN
  Administered 2012-08-29: 08:00:00 via INTRAVENOUS

## 2012-08-29 MED ORDER — BUPIVACAINE HCL (PF) 0.25 % IJ SOLN
INTRAMUSCULAR | Status: DC | PRN
  Administered 2012-08-29: 10 mL

## 2012-08-29 MED ORDER — PROPOFOL 10 MG/ML IV BOLUS
INTRAVENOUS | Status: DC | PRN
  Administered 2012-08-29: 180 mg via INTRAVENOUS

## 2012-08-29 MED ORDER — SODIUM CHLORIDE 0.9 % IV SOLN
INTRAVENOUS | Status: AC
  Filled 2012-08-29: qty 500

## 2012-08-29 MED ORDER — SODIUM CHLORIDE 0.9 % IV SOLN: 250.0000 mL | INTRAVENOUS | Status: DC

## 2012-08-29 MED ORDER — HYDROCODONE-ACETAMINOPHEN 5-325 MG PO TABS
1.0000 | ORAL_TABLET | ORAL | Status: DC | PRN
  Administered 2012-08-29 – 2012-09-04 (×17): 2 via ORAL
  Filled 2012-08-29 (×3): qty 2
  Filled 2012-08-29: qty 1
  Filled 2012-08-29 (×8): qty 2
  Filled 2012-08-29: qty 1
  Filled 2012-08-29 (×5): qty 2

## 2012-08-29 MED ORDER — HYDROMORPHONE HCL PF 1 MG/ML IJ SOLN
INTRAMUSCULAR | Status: AC
  Filled 2012-08-29: qty 1

## 2012-08-29 SURGICAL SUPPLY — 42 items
BAG DECANTER FOR FLEXI CONT (MISCELLANEOUS) ×2 IMPLANT
BENZOIN TINCTURE PRP APPL 2/3 (GAUZE/BANDAGES/DRESSINGS) ×2 IMPLANT
BUR MATCHSTICK NEURO 3.0 LAGG (BURR) ×2 IMPLANT
CANISTER SUCTION 2500CC (MISCELLANEOUS) ×2 IMPLANT
CLOTH BEACON ORANGE TIMEOUT ST (SAFETY) ×2 IMPLANT
CONT SPEC 4OZ CLIKSEAL STRL BL (MISCELLANEOUS) ×2 IMPLANT
DRAPE LAPAROTOMY 100X72X124 (DRAPES) ×2 IMPLANT
DRAPE MICROSCOPE ZEISS OPMI (DRAPES) ×2 IMPLANT
DRAPE POUCH INSTRU U-SHP 10X18 (DRAPES) ×2 IMPLANT
DRAPE SURG 17X23 STRL (DRAPES) ×2 IMPLANT
DRESSING TELFA 8X3 (GAUZE/BANDAGES/DRESSINGS) ×2 IMPLANT
DRSG OPSITE 4X5.5 SM (GAUZE/BANDAGES/DRESSINGS) ×2 IMPLANT
DURAPREP 26ML APPLICATOR (WOUND CARE) ×2 IMPLANT
ELECT REM PT RETURN 9FT ADLT (ELECTROSURGICAL) ×2
ELECTRODE REM PT RTRN 9FT ADLT (ELECTROSURGICAL) ×1 IMPLANT
GAUZE SPONGE 4X4 16PLY XRAY LF (GAUZE/BANDAGES/DRESSINGS) IMPLANT
GLOVE BIO SURGEON STRL SZ8 (GLOVE) ×2 IMPLANT
GOWN BRE IMP SLV AUR LG STRL (GOWN DISPOSABLE) IMPLANT
GOWN BRE IMP SLV AUR XL STRL (GOWN DISPOSABLE) ×2 IMPLANT
GOWN STRL REIN 2XL LVL4 (GOWN DISPOSABLE) IMPLANT
HEMOSTAT POWDER KIT SURGIFOAM (HEMOSTASIS) IMPLANT
KIT BASIN OR (CUSTOM PROCEDURE TRAY) ×2 IMPLANT
KIT ROOM TURNOVER OR (KITS) ×2 IMPLANT
NEEDLE HYPO 25X1 1.5 SAFETY (NEEDLE) ×2 IMPLANT
NEEDLE SPNL 20GX3.5 QUINCKE YW (NEEDLE) IMPLANT
NS IRRIG 1000ML POUR BTL (IV SOLUTION) ×2 IMPLANT
PACK LAMINECTOMY NEURO (CUSTOM PROCEDURE TRAY) ×2 IMPLANT
PAD ARMBOARD 7.5X6 YLW CONV (MISCELLANEOUS) ×6 IMPLANT
RUBBERBAND STERILE (MISCELLANEOUS) ×4 IMPLANT
SPONGE GAUZE 4X4 12PLY (GAUZE/BANDAGES/DRESSINGS) ×2 IMPLANT
SPONGE SURGIFOAM ABS GEL SZ50 (HEMOSTASIS) ×2 IMPLANT
STRIP CLOSURE SKIN 1/2X4 (GAUZE/BANDAGES/DRESSINGS) ×2 IMPLANT
SUT VIC AB 0 CT1 18XCR BRD8 (SUTURE) ×1 IMPLANT
SUT VIC AB 0 CT1 8-18 (SUTURE) ×1
SUT VIC AB 2-0 CP2 18 (SUTURE) ×2 IMPLANT
SUT VIC AB 3-0 SH 8-18 (SUTURE) ×2 IMPLANT
SYR 20ML ECCENTRIC (SYRINGE) ×2 IMPLANT
TAPE CLOTH SURG 4X10 WHT LF (GAUZE/BANDAGES/DRESSINGS) ×2 IMPLANT
TAPE STRIPS DRAPE STRL (GAUZE/BANDAGES/DRESSINGS) ×2 IMPLANT
TOWEL OR 17X24 6PK STRL BLUE (TOWEL DISPOSABLE) ×2 IMPLANT
TOWEL OR 17X26 10 PK STRL BLUE (TOWEL DISPOSABLE) ×2 IMPLANT
WATER STERILE IRR 1000ML POUR (IV SOLUTION) ×2 IMPLANT

## 2012-08-29 NOTE — Anesthesia Postprocedure Evaluation (Signed)
  Anesthesia Post-op Note  Patient: Robert Beltran  Procedure(s) Performed: Procedure(s): LUMBAR LAMINECTOMY/DECOMPRESSION MICRODISCECTOMY 2 LEVELS (N/A)  Patient Location: PACU  Anesthesia Type:General  Level of Consciousness: awake  Airway and Oxygen Therapy: Patient Spontanous Breathing  Post-op Pain: mild  Post-op Assessment: Post-op Vital signs reviewed  Post-op Vital Signs: Reviewed  Complications: No apparent anesthesia complications

## 2012-08-29 NOTE — Progress Notes (Signed)
PT Cancellation Note  Patient Details Name: Robert Beltran MRN: [id number] DOB: 09-26-1923   Cancelled Treatment:     Pt politely declined out of bed mobility today due to weakness from just having surgery. Will follow up tomorrow.    Sherrine Maples Cheek 08/29/2012, 2:29 PM

## 2012-08-29 NOTE — Anesthesia Preprocedure Evaluation (Addendum)
Anesthesia Evaluation  Patient identified by MRN, date of birth, ID band Patient awake    Reviewed: Allergy & Precautions, H&P , NPO status , Patient's Chart, lab work & pertinent test results, reviewed documented beta blocker date and time   Airway Mallampati: II      Dental  (+) Teeth Intact, Dental Advisory Given and Poor Dentition   Pulmonary neg pulmonary ROS,  breath sounds clear to auscultation        Cardiovascular hypertension, Pt. on medications + dysrhythmias Rhythm:Regular Rate:Normal     Neuro/Psych    GI/Hepatic Neg liver ROS, GERD-  Medicated and Controlled,  Endo/Other  diabetes, Well Controlled, Type 1  Renal/GU negative Renal ROS     Musculoskeletal   Abdominal (+)  Abdomen: soft. Bowel sounds: normal.  Peds  Hematology   Anesthesia Other Findings   Reproductive/Obstetrics                        Anesthesia Physical Anesthesia Plan  ASA: III  Anesthesia Plan: General   Post-op Pain Management:    Induction: Intravenous  Airway Management Planned: Oral ETT  Additional Equipment:   Intra-op Plan:   Post-operative Plan: Extubation in OR  Informed Consent: I have reviewed the patients History and Physical, chart, labs and discussed the procedure including the risks, benefits and alternatives for the proposed anesthesia with the patient or authorized representative who has indicated his/her understanding and acceptance.   Dental advisory given  Plan Discussed with: CRNA, Anesthesiologist and Surgeon  Anesthesia Plan Comments:        Anesthesia Quick Evaluation

## 2012-08-29 NOTE — Anesthesia Procedure Notes (Signed)
Procedure Name: Intubation Date/Time: 08/29/2012 7:45 AM Performed by: Ellin Goodie Pre-anesthesia Checklist: Patient identified, Emergency Drugs available, Suction available, Patient being monitored and Timeout performed Patient Re-evaluated:Patient Re-evaluated prior to inductionOxygen Delivery Method: Circle system utilized Preoxygenation: Pre-oxygenation with 100% oxygen Intubation Type: IV induction Ventilation: Mask ventilation without difficulty Laryngoscope Size: Mac and 3 Grade View: Grade II Tube type: Oral Tube size: 7.5 mm Number of attempts: 1 Airway Equipment and Method: Stylet Placement Confirmation: ETT inserted through vocal cords under direct vision,  positive ETCO2 and breath sounds checked- equal and bilateral Secured at: 22 cm Tube secured with: Tape Dental Injury: Teeth and Oropharynx as per pre-operative assessment

## 2012-08-29 NOTE — Preoperative (Signed)
Beta Blockers   Reason not to administer Beta Blockers:Not Applicable 

## 2012-08-29 NOTE — Op Note (Signed)
08/29/2012  9:09 AM  PATIENT:  Robert Beltran  79 y.o. 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 He also had numbness in his legs. This was progressive over quite a long time. MRI showed thoracic spinal stenosis with cord compression at T10-11 T11-12. I recommended a thoracic laminectomy for decompression of the canal.  Patient understood the risks, benefits, and alternatives and potential outcomes and wished to proceed.  PROCEDURE DETAILS: The patient was taken to the operating room and after induction of adequate generalized endotracheal anesthesia, the patient was rolled into the prone position on the Wilson frame and all pressure points were padded. The thoracic region was cleaned and then prepped with DuraPrep and draped in the usual sterile fashion. A preoperative localizing x-ray was taken. 5 cc of local anesthesia was injected and then a dorsal midline incision was made and carried down to the thoracic fascia. The fascia was opened and the paraspinous musculature was taken down in a subperiosteal fashion to expose T10-11 T11-12 bilaterally. Intraoperative x-ray confirmed my level, and then I used a combination of the high-speed drill and the Kerrison punches to perform a laminectomy, medial facetectomy, and foraminotomy at T10-11 T11-12 bilaterally. The underlying yellow ligament was opened and removed in a piecemeal fashion to expose the underlying dura and exiting nerve roots. I undercut the lateral recess and  dissected down until I was medial to and distal to the pedicle. The nerve roots and central canal was well decompressed. I irrigated with saline solution containing bacitracin. Achieved hemostasis with bipolar cautery, lined the dura with Gelfoam, and then closed the fascia with 0 Vicryl. I closed the subcutaneous tissues with 2-0 Vicryl and the subcuticular tissues with 3-0 Vicryl. The skin was then closed with benzoin and Steri-Strips. The drapes were removed, a sterile dressing was applied. The patient was awakened from general anesthesia and transferred to the recovery room in stable condition. At the end of the procedure all sponge, needle and instrument counts were correct.   PLAN OF CARE: Admit to inpatient   PATIENT DISPOSITION:  PACU - hemodynamically stable.   Delay start of Pharmacological VTE agent (>24hrs) due to surgical blood loss or risk of bleeding:  yes

## 2012-08-29 NOTE — Transfer of Care (Signed)
Immediate Anesthesia Transfer of Care Note  Patient: Robert Beltran  Procedure(s) Performed: Procedure(s): LUMBAR LAMINECTOMY/DECOMPRESSION MICRODISCECTOMY 2 LEVELS (N/A)  Patient Location: PACU  Anesthesia Type:General  Level of Consciousness: awake, alert  and oriented  Airway & Oxygen Therapy: Patient connected to face mask oxygen  Post-op Assessment: Report given to PACU RN  Post vital signs: stable  Complications: No apparent anesthesia complications

## 2012-08-29 NOTE — Clinical Social Work Note (Signed)
Clinical Social Work   CSW received consult for SNF. CSW reviewed chart. Pt has surgery today. Awaiting PT/OT evals. CSW will assess pt for SNF, if appropriate. Please call with any urgent needs. CSW will continue to follow.   Dede Query, MSW, Theresia Majors [phone]

## 2012-08-29 NOTE — H&P (Signed)
Subjective: Patient is a 79 y.o. male admitted for thoracic laminectomy for stenosis.. Onset of symptoms was several monhs ago, gradually worseningsince that time.  The pain is rated moderate, and he describes leg weakness and numbness.. The pain is described as aching and occurs intermittently. The symptoms have been progressive. Symptoms are exacerbated by activity. MRI or CT showed thoracic stenosis.   Past Medical History  Diagnosis Date  . Hypertension     sees Dr. Rodrigo Ran,   . Diabetes mellitus without complication   . Benign prostate hyperplasia   . GERD (gastroesophageal reflux disease)   . Cancer     testicular cancer 1964  . Arthritis   . Chronic back pain     Past Surgical History  Procedure Laterality Date  . Eye surgery      bilateral surgery  . Appendectomy      1995  . Shoulder surgery      right    Prior to Admission medications   Medication Sig Start Date End Date Taking? Authorizing Ranell Finelli  chlorthalidone (HYGROTON) 25 MG tablet Take 25 mg by mouth daily.   Yes Historical Vi Biddinger, MD  ergocalciferol (VITAMIN D2) 50000 UNITS capsule Take 50,000 Units by mouth once a week.   Yes Historical Yakir Wenke, MD  metFORMIN (GLUCOPHAGE) 500 MG tablet Take 500 mg by mouth 2 (two) times daily with a meal.   Yes Historical Agron Swiney, MD  Multiple Vitamin (MULTIVITAMIN WITH MINERALS) TABS Take 1 tablet by mouth daily.   Yes Historical Breonia Kirstein, MD  Omeprazole Magnesium (PRILOSEC OTC PO) Take 20 mg by mouth daily.   Yes Historical Karthik Whittinghill, MD  ramipril (ALTACE) 10 MG capsule Take 10 mg by mouth daily.   Yes Historical Linda Biehn, MD  rOPINIRole (REQUIP) 0.25 MG tablet Take 0.25-1 mg by mouth daily as needed. For restless leg   Yes Historical Amalio Loe, MD  rosuvastatin (CRESTOR) 20 MG tablet Take 20 mg by mouth daily.   Yes Historical Leanette Eutsler, MD  traMADol (ULTRAM) 50 MG tablet Take 50 mg by mouth every 6 (six) hours as needed for pain.   Yes Historical Jessicaann Overbaugh, MD    Allergies  Allergen Reactions  . Aspirin Other (See Comments)    GI bleed  . Nsaids Other (See Comments)    GI distress  . Other Nausea Only    Coricidin    History  Substance Use Topics  . Smoking status: Former Games developer  . Smokeless tobacco: Not on file     Comment: 1981  . Alcohol Use: Not on file     Comment: "occas"    History reviewed. No pertinent family history.   Review of Systems  Positive ROS: neg  All other systems have been reviewed and were otherwise negative with the exception of those mentioned in the HPI and as above.  Objective: Vital signs in last 24 hours: Weight:  [77.111 kg (170 lb)] 77.111 kg (170 lb) (03/03 1639)  General Appearance: Alert, cooperative, no distress, appears stated age Head: Normocephalic, without obvious abnormality, atraumatic Eyes: PERRL, conjunctiva/corneas clear, EOM's intact      Neck: Supple Lungs: respirations unlabored Heart: Regular rate and rhythm Abdomen: Soft Extremities: Extremities normal, atraumatic, no cyanosis or edema Skin: Skin color, texture, turgor normal, no rashes or lesions  NEUROLOGIC:   Mental status: Alert and oriented x4,  no aphasia, good attention span, fund of knowledge, and memory Motor Exam - grossly normal Sensory Exam - grossly normal Reflexes: 1+ Coordination - grossly normal Gait - antalgic  Balance - decreased Cranial Nerves: I: smell Not tested  II: visual acuity  OS: nl    OD: nl  II: visual fields Full to confrontation  II: pupils Equal, round, reactive to light  III,VII: ptosis None  III,IV,VI: extraocular muscles  Full ROM  V: mastication Normal  V: facial light touch sensation  Normal  V,VII: corneal reflex  Present  VII: facial muscle function - upper  Normal  VII: facial muscle function - lower Normal  VIII: hearing Not tested  IX: soft palate elevation  Normal  IX,X: gag reflex Present  XI: trapezius strength  5/5  XI: sternocleidomastoid strength 5/5  XI: neck  flexion strength  5/5  XII: tongue strength  Normal    Data Review No results found for this basename: WBC, HGB, HCT, MCV, PLT   No results found for this basename: NA, K, CL, CO2, BUN, creatinine, glucose   No results found for this basename: INR, PROTIME    Assessment/Plan: Patient admitted for thoracic lam for stenosis. Patient has failed conservative therapy.  I explained the condition and procedure to the patient and answered any questions.  Patient wishes to proceed with procedure as planned. Understands risks/ benefits and typical outcomes of procedure.   JONES,DAVID S 08/29/2012 6:29 AM

## 2012-08-30 ENCOUNTER — Encounter (HOSPITAL_COMMUNITY): Payer: Self-pay | Admitting: Neurological Surgery

## 2012-08-30 LAB — POCT I-STAT 4, (NA,K, GLUC, HGB,HCT)
Glucose, Bld: 118 mg/dL — ABNORMAL HIGH (ref 70–99)
HCT: 49 % (ref 39.0–52.0)
Potassium: 3.7 mEq/L (ref 3.5–5.1)

## 2012-08-30 LAB — GLUCOSE, CAPILLARY: Glucose-Capillary: 119 mg/dL — ABNORMAL HIGH (ref 70–99)

## 2012-08-30 MED ORDER — ROPINIROLE HCL 1 MG PO TABS
1.0000 mg | ORAL_TABLET | Freq: Two times a day (BID) | ORAL | Status: DC | PRN
  Administered 2012-08-30 – 2012-09-06 (×8): 1 mg via ORAL
  Filled 2012-08-30 (×7): qty 1

## 2012-08-30 NOTE — Evaluation (Signed)
Physical Therapy Evaluation Patient Details Name: Robert Beltran MRN: [id number] DOB: 01-12-1924 Today's Date: 08/30/2012 Time: 4098-1191 PT Time Calculation (min): 45 min  PT Assessment / Plan / Recommendation Clinical Impression  Pt is an 79 yo male s/p thoracic laminectomy presenting with thoracic back pain and L LE weakness. Pt to greatly benefit from ST-SNF placement to address impaired mobility and achieve mod I function for safe transition home. Pt currently requires maximal assist for all mobility and ADls. PT to acutely follow to address mentioned deficits.    PT Assessment  Patient needs continued PT services    Follow Up Recommendations  SNF;Supervision/Assistance - 24 hour    Does the patient have the potential to tolerate intense rehabilitation      Barriers to Discharge Decreased caregiver support (pt lives alone) son live an hour away    Equipment Recommendations  None recommended by PT    Recommendations for Other Services     Frequency Min 5X/week    Precautions / Restrictions Precautions Precautions: Back;Fall Precaution Booklet Issued: Yes (comment) Precaution Comments: pt educated and with good recall Restrictions Weight Bearing Restrictions: No   Pertinent Vitals/Pain 8/10 "soarness"      Mobility  Bed Mobility Bed Mobility: Rolling Right;Right Sidelying to Sit;Sitting - Scoot to [place] of Bed Rolling Right: 3: Mod assist;With rail Right Sidelying to Sit: 3: Mod assist;With rails;HOB flat Sitting - Scoot to Edge of Bed: 3: Mod assist;With rail Details for Bed Mobility Assistance: verbal directional cues, assist for L LE management, assist for trunk elevation due to increased pain during transition Transfers Transfers: Sit to Stand;Stand to Sit Sit to Stand: 1: +2 Total assist;With upper extremity assist;From bed (unsuccessful with RW, required bilat underarm assist) Sit to Stand: Patient Percentage: 40% Stand to Sit: 1: +2 Total assist;To  chair/3-in-1 Stand to Sit: Patient Percentage: 30% Stand Pivot Transfers: 1: +2 Total assist Stand Pivot Transfers: Patient Percentage: 30% Details for Transfer Assistance: completed sit --> stand x 2. pt with decreased ability to weight-bear through L LE with significant reliance on Bilat UEs. Pt required assist to advance L LE and max directional v/c's to complete task. Ambulation/Gait Ambulation/Gait Assistance: Not tested (comment) Wheelchair Mobility Wheelchair Mobility: No    Exercises General Exercises - Lower Extremity Long Arc Quad: AROM;Left;5 reps;Seated (with 3 second hold, gave for HEP) Hip ABduction/ADduction: AROM;Left;10 reps;Supine (assist to achieve L knee flexion with foot flat on bed and worked on keeping knee upright. Pt with decreased control due to weakness but improved s/p 10 reps.   PT Diagnosis: Difficulty walking;Acute pain  PT Problem List: Decreased strength;Decreased activity tolerance;Decreased balance;Decreased mobility;Decreased coordination PT Treatment Interventions: Gait training;Functional mobility training;Therapeutic activities;Therapeutic exercise   PT Goals Acute Rehab PT Goals PT Goal Formulation: With patient Time For Goal Achievement: 09/13/12 Potential to Achieve Goals: Good Pt will Roll Supine to Right Side: with supervision;with rail PT Goal: Rolling Supine to Right Side - Progress: Goal set today Pt will go Supine/Side to Sit: with supervision;with HOB 0 degrees;with rail PT Goal: Supine/Side to Sit - Progress: Goal set today Pt will go Sit to Stand: with min assist;with upper extremity assist (up to RW) PT Goal: Sit to Stand - Progress: Goal set today Pt will go Stand to Sit: with min assist;with upper extremity assist PT Goal: Stand to Sit - Progress: Goal set today Pt will Transfer Bed to Chair/Chair to Bed: with min assist (with RW) PT Transfer Goal: Bed to Chair/Chair to Bed - Progress: Goal  set today Pt will Ambulate: 16 - 50  feet;with min assist;with rolling walker PT Goal: Ambulate - Progress: Goal set today Pt will Perform Home Exercise Program: Independently PT Goal: Perform Home Exercise Program - Progress: Goal set today Additional Goals Additional Goal #1: Pt able to recall 3/3 precautions independently and be 100% compliant. PT Goal: Additional Goal #1 - Progress: Goal set today  Visit Information  Last PT Received On: 08/30/12 Assistance Needed: +2 PT/OT Co-Evaluation/Treatment: Yes    Subjective Data  Subjective: Pt received supine in bed agreeable to PT. Patient Stated Goal: home alone   Prior Functioning  Home Living Lives With: Alone (was in ALF just prior to admission) Available Help at Discharge:  (none) Type of Home: House Home Access: Stairs to enter [employer] of Steps: 1 Entrance Stairs-Rails: None Home Layout: One level Bathroom Shower/Tub: Health visitor: Standard Bathroom Accessibility: Yes How Accessible: Accessible via wheelchair Home Adaptive Equipment: Straight cane;Walker - rolling;Bedside commode/3-in-1;Built-in shower seat;Wheelchair - manual Prior Function Level of Independence: Independent with assistive device(s) Driving: Yes Vocation: Retired Comments: Pt was reliant on w/c 2 weeks prior to sx. Communication Communication: HOH Dominant Hand: Right    Cognition  Cognition Overall Cognitive Status: Appears within functional limits for tasks assessed/performed Arousal/Alertness: Awake/alert Orientation Level: Oriented X4 / Intact Behavior During Session: [facility] for tasks performed    Extremity/Trunk Assessment Right Upper Extremity Assessment RUE ROM/Strength/Tone: WFL for tasks assessed RUE Sensation: WFL - Light Touch Left Upper Extremity Assessment LUE ROM/Strength/Tone: WFL for tasks assessed LUE Sensation: WFL - Light Touch Right Lower Extremity Assessment RLE ROM/Strength/Tone: Within functional levels RLE Sensation: WFL -  Light Touch Left Lower Extremity Assessment LLE ROM/Strength/Tone: Deficits LLE ROM/Strength/Tone Deficits: grossly 2+/5 LLE Sensation: Deficits LLE Sensation Deficits: numbness Trunk Assessment Trunk Assessment: Normal   Balance Balance Balance Assessed: Yes Static Sitting Balance Static Sitting - Balance Support: No upper extremity supported;Feet supported Static Sitting - Level of Assistance: 4: Min assist Static Sitting - Comment/# of Minutes: pt able to maintain balance for <1 min but then would lean to the Left and catch self with UEs  End of Session PT - End of Session Equipment Utilized During Treatment: Gait belt Activity Tolerance: Patient tolerated treatment well Patient left: in chair;with family/visitor present;with call bell/phone within reach;with nursing in room Nurse Communication: Mobility status;Need for lift equipment (sarah steady)  GP     [name] 08/30/2012, 10:37 AM  Lewis Shock, PT, DPT Pager #: [phone] Office #: [phone]

## 2012-08-30 NOTE — Evaluation (Signed)
Occupational Therapy Evaluation Patient Details Name: Robert Beltran MRN: [id number] DOB: 06-Oct-1923 Today's Date: 08/30/2012 Time: 1191-4782 OT Time Calculation (min): 45 min  OT Assessment / Plan / Recommendation Clinical Impression  Pt is recovering from thoracic laminectomies, facetectomies, foraminotomies.  He was experiencing signifcant functional and mobility decline PTA , residing at ALF and using a w/c primarily.  Pt requires +2 assist for OOB mobility and is dependent in ADL.  Will follow acutely.  Pt will need SNF for rehab.    OT Assessment  Patient needs continued OT Services    Follow Up Recommendations  SNF    Barriers to Discharge Decreased caregiver support    Equipment Recommendations  None recommended by OT    Recommendations for Other Services    Frequency  Min 2X/week    Precautions / Restrictions Precautions Precautions: Back;Fall Precaution Booklet Issued: Yes (comment) Precaution Comments: pt educated and with good recall Restrictions Weight Bearing Restrictions: No   Pertinent Vitals/Pain 8/10 back, RN notified, repositioned    ADL  Eating/Feeding: Independent Where Assessed - Eating/Feeding: Bed level Grooming: Set up Where Assessed - Grooming: Supported sitting Upper Body Bathing: Minimal assistance Where Assessed - Upper Body Bathing: Supported sitting Lower Body Bathing: +1 Total assistance Where Assessed - Lower Body Bathing: Supported sitting;Supported sit to stand Upper Body Dressing: Maximal assistance Where Assessed - Upper Body Dressing: Unsupported sitting Lower Body Dressing: +1 Total assistance Where Assessed - Lower Body Dressing: Supported sitting;Supported sit to stand Equipment Used: Rolling walker;Gait belt Transfers/Ambulation Related to ADLs: +2 total assist pt 30% for stand pivot to chair, pt in non ambulatory ADL Comments: Pt was able to cross his foot over opposite knee assisting with his UEs to perform LB ADL PTA.    OT  Diagnosis: Generalized weakness;Acute pain  OT Problem List: Decreased strength;Decreased activity tolerance;Impaired balance (sitting and/or standing);Decreased knowledge of use of DME or AE;Decreased knowledge of precautions;Pain;Decreased safety awareness OT Treatment Interventions: Self-care/ADL training;DME and/or AE instruction;Therapeutic activities;Patient/family education;Balance training   OT Goals Acute Rehab OT Goals OT Goal Formulation: With patient Time For Goal Achievement: 09/13/12 Potential to Achieve Goals: Good ADL Goals Pt Will Transfer to Toilet: with 2+ total assist;Stand pivot transfer;Maintaining back safety precautions (pt 70%) ADL Goal: Toilet Transfer - Progress: Goal set today Miscellaneous OT Goals Miscellaneous OT Goal #1: Pt will perform bed mobility with min assist in prep for ADL at EOB. OT Goal: Miscellaneous Goal #1 - Progress: Goal set today Miscellaneous OT Goal #2: Pt will sit EOB unsupported x 10 minutes without reliance on UEs for balance while engaged in ADL. OT Goal: Miscellaneous Goal #2 - Progress: Goal set today Miscellaneous OT Goal #3: Pt will state 3 of 3 back precautions independently. OT Goal: Miscellaneous Goal #3 - Progress: Goal set today  Visit Information  Last OT Received On: 08/30/12 Assistance Needed: +2 PT/OT Co-Evaluation/Treatment: Yes    Subjective Data  Subjective: "I was in a w/c most of the time." Patient Stated Goal: SNF for rehab in order to return home.   Prior Functioning     Home Living Lives With: Alone Available Help at Discharge:  (none) Type of Home: House Home Access: Stairs to enter [employer] of Steps: 1 Entrance Stairs-Rails: None Home Layout: One level Bathroom Shower/Tub: Health visitor: Standard Bathroom Accessibility: Yes How Accessible: Accessible via wheelchair Home Adaptive Equipment: Straight cane;Walker - rolling;Bedside commode/3-in-1;Built-in shower  seat;Wheelchair - manual Prior Function Level of Independence: Independent with assistive device(s) Driving: Yes  Vocation: Retired Comments: Pt was reliant on w/c 2 weeks prior to sx. Communication Communication: HOH Dominant Hand: Right         Vision/Perception Vision - History Baseline Vision: Wears glasses all the time   Cognition  Cognition Overall Cognitive Status: Appears within functional limits for tasks assessed/performed Arousal/Alertness: Awake/alert Orientation Level: Oriented X4 / Intact Behavior During Session: [facility] for tasks performed    Extremity/Trunk Assessment Right Upper Extremity Assessment RUE ROM/Strength/Tone: WFL for tasks assessed RUE Sensation: WFL - Light Touch RUE Coordination: WFL - gross/fine motor Left Upper Extremity Assessment LUE ROM/Strength/Tone: WFL for tasks assessed LUE Sensation: WFL - Light Touch LUE Coordination: WFL - gross/fine motor    Mobility Bed Mobility Bed Mobility: Rolling Right;Right Sidelying to Sit;Sitting - Scoot to [place] of Bed Rolling Right: 3: Mod assist;With rail Right Sidelying to Sit: 3: Mod assist;With rails;HOB flat Sitting - Scoot to Edge of Bed: 3: Mod assist;With rail Details for Bed Mobility Assistance: verbal directional cues, assist for L LE management, assist for trunk elevation due to increased pain during transition Transfers Transfers: Sit to Stand;Stand to Sit Sit to Stand: 1: +2 Total assist;With upper extremity assist;From bed Sit to Stand: Patient Percentage: 40% Stand to Sit: 1: +2 Total assist;To chair/3-in-1 Stand to Sit: Patient Percentage: 30% Details for Transfer Assistance: completed sit --> stand x 2. pt with decreased ability to weight-bear through L LE with significant reliance on Bilat UEs. Pt required assist to advance L LE and max directional v/c's to complete task.     Exercise   Balance Balance Balance Assessed: Yes Static Sitting Balance Static Sitting - Balance Support:  No upper extremity supported;Feet supported Static Sitting - Level of Assistance: 4: Min assist Static Sitting - Comment/# of Minutes: 10 Dynamic Sitting Balance Dynamic Sitting - Comments: Pt unable to use his hand effectively in unsupported sitting   End of Session OT - End of Session Activity Tolerance: Patient limited by pain;Patient limited by fatigue Patient left: in chair;with call bell/phone within reach;with family/visitor present Nurse Communication: Mobility status;Need for lift equipment;Patient requests pain meds  GO     Evern Bio 08/30/2012, 10:46 AM [phone]

## 2012-08-30 NOTE — Progress Notes (Signed)
Physical Therapy Treatment Patient Details Name: Robert Beltran MRN: [id number] DOB: 24-Jan-1924 Today's Date: 08/30/2012 Time: 4098-1191 PT Time Calculation (min): 8 min  PT Assessment / Plan / Recommendation Comments on Treatment Session       PT returned to provide HEP per patient request.  Provided hand written HEP and went over all there ex. Pt with good understanding and return demo of all there ex.  Exercises General Exercises - Lower Extremity Quad Sets: AROM;Left;10 reps;Supine Gluteal Sets: AROM;Left;10 reps;Supine Hip ABduction/ADduction: AROM;Left;10 reps;Supine       [name], Becky Sax 08/30/2012, 5:14 PM  Lewis Shock, PT, DPT Pager #: [phone] Office #: [phone]

## 2012-08-30 NOTE — Progress Notes (Signed)
Patient ID: Robert Beltran, male   DOB: 22-Jul-1923, 79 y.o.   MRN: [id number] Doing well. appropriately sore. No new N/T/W. Stable prox LE weakness. Moves legs well in bed. L HF weakness 2/5 stable. Quads 4/5. Good distal strength. Cont PT/OT. Suspect he needs SNF when bed available.

## 2012-08-31 NOTE — Clinical Social Work Psychosocial (Signed)
Clinical Social Work Department BRIEF PSYCHOSOCIAL ASSESSMENT 08/31/2012  Patient:  Robert Beltran, Robert Beltran     Account Number:  [id number]     Admit date:  08/29/2012  Clinical Social Worker:  Peggyann Shoals  Date/Time:  08/31/2012 04:30 PM  Referred by:  Physician  Date Referred:  08/30/2012 Referred for  SNF Placement   Other Referral:   Interview type:  Family Other interview type:    PSYCHOSOCIAL DATA Living Status:  ALONE Admitted from facility:   Level of care:   Primary support name:  Darcella Cheshire. Primary support relationship to patient:  CHILD, ADULT Degree of support available:   supportive    CURRENT CONCERNS Current Concerns  Post-Acute Placement   Other Concerns:    SOCIAL WORK ASSESSMENT / PLAN CSW met with pt and pt's son to address consult for SNF. Currently, PT is recommending SNF at discharge. CSW introduced herself and expalined role of social work. CSW also explained the process of discharging to SNF.    Pt lives alone. Pt's son is very involved with pt's care.    CSW will initiate SNF referral for [facility] and follow up with bed offers. CSW will continue to follow.   Assessment/plan status:  Psychosocial Support/Ongoing Assessment of Needs Other assessment/ plan:   Information/referral to community resources:   SNF List    PATIENT'S/FAMILY'S RESPONSE TO PLAN OF CARE: Pt was very pleasant and agreeable to discharge plan. Pt's son is in agreement as well.   Dede Query, MSW, Theresia Majors [phone]

## 2012-08-31 NOTE — Progress Notes (Signed)
Physical Therapy Treatment Patient Details Name: Robert Beltran MRN: [id number] DOB: 1924-04-06 Today's Date: 08/31/2012 Time: 4098-1191 PT Time Calculation (min): 24 min  PT Assessment / Plan / Recommendation Comments on Treatment Session  Pt had difficulty using UE's sufficiently enough to achieve full upright standing.  Pt also expressed that he was fearful of falling when completing sit<>stand exercises during this session.    Follow Up Recommendations  SNF;Supervision/Assistance - 24 hour     Does the patient have the potential to tolerate intense rehabilitation     Barriers to Discharge        Equipment Recommendations  None recommended by PT    Recommendations for Other Services    Frequency Min 5X/week   Plan Discharge plan remains appropriate    Precautions / Restrictions Precautions Precautions: Back;Fall Precaution Comments: Pt unable to recall any of the 3 back precautions.  Re-educated on all 3.  Restrictions Weight Bearing Restrictions: No   Pertinent Vitals/Pain Pt indicates he has no pain, just soreness and weakness.    Mobility  Bed Mobility Bed Mobility: Rolling Right;Right Sidelying to Sit;Sitting - Scoot to Edge of Bed Rolling Right: 3: Mod assist;With rail Right Sidelying to Sit: 3: Mod assist;With rails;HOB flat Sitting - Scoot to Edge of Bed: 3: Mod assist;With rail Details for Bed Mobility Assistance: Cues for sequencing & positioning.  (A) for LE's & trunk elevation. Transfers Transfers: Sit to Stand;Stand to Sit;Stand Pivot Transfers Sit to Stand: 1: +2 Total assist;With upper extremity assist;From bed Sit to Stand: Patient Percentage: 40% Stand to Sit: 1: +2 Total assist;To chair/3-in-1 Stand to Sit: Patient Percentage: 40% Stand Pivot Transfers: 1: +1 Total assist Stand Pivot Transfers: Patient Percentage: 40% Details for Transfer Assistance: Pt completed sit<>stand 4 x's.  Pt required max cuing for UE placement, to push into UE's, stand  upright with extended knees.  Pt required (A) to achieve standing, facilitation to promote trunk extension, balance, move RW during pivot bed>chair, & controlled descent.    Pt not standing completely upright + knees flexed in standing position.  VC's to move LE's during stand pivot transfer.  Pt able to complete standing marching x 5 reps each leg.   Ambulation/Gait Ambulation/Gait Assistance: Not tested (comment) Wheelchair Mobility Wheelchair Mobility: No    Exercises     PT Diagnosis:    PT Problem List:   PT Treatment Interventions:     PT Goals Acute Rehab PT Goals Time For Goal Achievement: 09/13/12 Potential to Achieve Goals: Good Pt will Roll Supine to Right Side: with supervision;with rail PT Goal: Rolling Supine to Right Side - Progress: Progressing toward goal Pt will go Supine/Side to Sit: with supervision;with HOB 0 degrees;with rail Pt will go Sit to Stand: with min assist;with upper extremity assist PT Goal: Sit to Stand - Progress: Progressing toward goal Pt will go Stand to Sit: with min assist;with upper extremity assist PT Goal: Stand to Sit - Progress: Progressing toward goal Pt will Transfer Bed to Chair/Chair to Bed: with min assist Pt will Ambulate: 16 - 50 feet;with min assist;with rolling walker Pt will Perform Home Exercise Program: Independently Additional Goals Additional Goal #1: Pt able to recall 3/3 precautions independently and be 100% compliant. PT Goal: Additional Goal #1 - Progress: Not progressing  Visit Information  Last PT Received On: 08/31/12 Assistance Needed: +2    Subjective Data  Subjective: Pt agreeable to PT.  Pt indicates that he has fallen a number of times recently and doesn't want  to fall.   Cognition  Cognition Overall Cognitive Status: Appears within functional limits for tasks assessed/performed Arousal/Alertness: Awake/alert Orientation Level: Appears intact for tasks assessed Behavior During Session: Other (comment)  (Fearful) Cognition - Other Comments: Pt fearful of falling.  Pt would sit without warning and after indicating he was going to fall.  Pt indicated he didn't know where his left foot was.    Balance  Balance Balance Assessed: No  End of Session PT - End of Session Equipment Utilized During Treatment: Gait belt Activity Tolerance: Patient tolerated treatment well;Patient limited by fatigue Patient left: in chair;with call bell/phone within reach Nurse Communication: Mobility status   GP     Enid Baas, SPTA 08/31/2012, 3:59 PM

## 2012-08-31 NOTE — Clinical Social Work Placement (Addendum)
Clinical Social Work Department CLINICAL SOCIAL WORK PLACEMENT NOTE 08/31/2012  Patient:  Robert Beltran, Robert Beltran  Account Number:  [id number] Admit date:  08/29/2012  Clinical Social Worker:  Peggyann Shoals  Date/time:  08/31/2012 04:30 PM  Clinical Social Work is seeking post-discharge placement for this patient at the following level of care:   SKILLED NURSING   (*CSW will update this form in Epic as items are completed)   08/31/2012  Patient/family provided with [facility] Department of Clinical Social Work's list of facilities offering this level of care within the geographic area requested by the patient (or if unable, by the patient's family).  08/31/2012  Patient/family informed of their freedom to choose among providers that offer the needed level of care, that participate in Medicare, Medicaid or managed care program needed by the patient, have an available bed and are willing to accept the patient.  08/31/2012  Patient/family informed of [facility]' ownership interest in [facility], as well as of the fact that they are under no obligation to receive care at this facility.  PASARR submitted to EDS on 09/01/2012 PASARR number received from [employer] on 09/01/2012  FL2 transmitted to all facilities in geographic area requested by pt/family on  09/01/2012 FL2 transmitted to all facilities within larger geographic area on   Patient informed that his/her managed care company has contracts with or will negotiate with  certain facilities, including the following:     Patient/family informed of bed offers received:  09/03/12 Dorma Russell) Patient chooses bed at [facility]  Physician recommends and patient chooses bed at  SNF  Patient to be transferred to [facility] on 09/07/2012 Patient to be transferred to facility by [facility]  The following physician request were entered in Epic:   Additional Comments:  Dede Query, MSW, LCSWA [phone]

## 2012-08-31 NOTE — Progress Notes (Signed)
Patient ID: Robert Beltran, male   DOB: February 12, 1924, 79 y.o.   MRN: [id number] Patient is pleased with how he is doing. He states his legs felt better. Seems to have increasing strength in lower studies in decreasing numbness. He gets an occasional pain to his left testicle. He has yet to ambulate. Awaiting SNF.

## 2012-09-01 NOTE — Progress Notes (Signed)
Subjective: Patient reports "My back is sore; but I'm ready to get on with this. My legs are some better."  Objective: Vital signs in last 24 hours: Temp:  [97.4 F (36.3 C)-99.3 F (37.4 C)] 97.4 F (36.3 C) (03/07 1046) Pulse Rate:  [81-91] 81 (03/07 1046) Resp:  [18-20] 18 (03/07 1046) BP: (98-134)/(46-56) 114/56 mmHg (03/07 1046) SpO2:  [93 %-100 %] 94 % (03/07 1046)  Intake/Output from previous day: 03/06 0701 - 03/07 0700 In: 360 [P.O.:360] Out: 1100 [Urine:1100] Intake/Output this shift: Total I/O In: 120 [P.O.:120] Out: -   Alert, conversant. Leg strength improving per pt. Telfa saturated beneath tegaderm at incision. No swelling.   Lab Results: No results found for this basename: WBC, HGB, HCT, PLT,  in the last 72 hours BMET No results found for this basename: NA, K, CL, CO2, GLUCOSE, BUN, CREATININE, CALCIUM,  in the last 72 hours  Studies/Results: No results found.  Assessment/Plan: Improving   LOS: 3 days  Ok to change drsg to incision prn. Continue to work with PT while awaiting SNF.   Georgiann Cocker 09/01/2012, 11:15 AM

## 2012-09-01 NOTE — Progress Notes (Signed)
Awaiting SNF placement

## 2012-09-01 NOTE — Progress Notes (Addendum)
Physical Therapy Treatment Patient Details Name: Robert Beltran MRN: [id number] DOB: Aug 16, 1923 Today's Date: 09/01/2012 Time: 4098-1191 PT Time Calculation (min): 30 min  PT Assessment / Plan / Recommendation Comments on Treatment Session  Pt able to maintain upright standing position using stedy with max cues.  Pt complains of shin pain while standing in stedy even after placed towel for extra padding. Pt was noted to have sores on shins prior to this session which may contribute to pain.      Follow Up Recommendations  SNF;Supervision/Assistance - 24 hour     Does the patient have the potential to tolerate intense rehabilitation     Barriers to Discharge        Equipment Recommendations  None recommended by PT    Recommendations for Other Services    Frequency Min 5X/week   Plan Discharge plan remains appropriate    Precautions / Restrictions Precautions Precautions: Back;Fall Precaution Comments: Pt unable to recall any of the 3 back precautions.  Re-educated on all 3.  Restrictions Weight Bearing Restrictions: No   Pertinent Vitals/Pain Pt reports no pain, soreness only    Mobility  Bed Mobility Bed Mobility: Rolling Right;Right Sidelying to Sit;Sitting - Scoot to Edge of Bed Rolling Right: With rail;4: Min assist Right Sidelying to Sit: 3: Mod assist;With rails;HOB elevated Sitting - Scoot to Edge of Bed: 3: Mod assist;With rail;Other (comment) (with pull sheet) Details for Bed Mobility Assistance: Cues for sequencing and positioning for log rolling.  (A) for trunk elevation & lowering LE sidelying <>sit.  (A) pull pad for scooting. Transfers Transfers: Sit to Stand;Stand to Sit Sit to Stand: 1: +2 Total assist;With upper extremity assist;From bed;From elevated surface Sit to Stand: Patient Percentage: 40% Stand to Sit: 1: +2 Total assist;With upper extremity assist;To chair/3-in-1;To bed;To elevated surface Stand to Sit: Patient Percentage: 40% Transfer via Lift  Equipment: Stedy Details for Transfer Assistance: Pt completed sit<>stand from bed 1x using RW- pt unable to move LE to pivot to chair therefore used Stedy to complete transfer to chair.  Sit<>Stand 4x's using stedy.  Practiced standing tolerance: 30 secs + 1 Min.   Faciliatation & cues for tall posture.   Ambulation/Gait Ambulation/Gait Assistance: Not tested (comment) Wheelchair Mobility Wheelchair Mobility: No      PT Goals Acute Rehab PT Goals Time For Goal Achievement: 09/13/12 Potential to Achieve Goals: Good Pt will Roll Supine to Right Side: with supervision;with rail PT Goal: Rolling Supine to Right Side - Progress: Progressing toward goal Pt will go Supine/Side to Sit: with supervision;with HOB 0 degrees;with rail PT Goal: Supine/Side to Sit - Progress: Progressing toward goal Pt will go Sit to Stand: with min assist;with upper extremity assist PT Goal: Sit to Stand - Progress: Progressing toward goal Pt will go Stand to Sit: with min assist;with upper extremity assist PT Goal: Stand to Sit - Progress: Progressing toward goal Pt will Transfer Bed to Chair/Chair to Bed: with min assist Pt will Ambulate: 16 - 50 feet;with min assist;with rolling walker Pt will Perform Home Exercise Program: Independently Additional Goals Additional Goal #1: Pt able to recall 3/3 precautions independently and be 100% compliant. PT Goal: Additional Goal #1 - Progress: Not progressing  Visit Information  Last PT Received On: 09/01/12 Assistance Needed: +2    Subjective Data  Subjective: Pt agreeable to PT.  Pt reports he has no pain, has soreness.   Cognition  Cognition Overall Cognitive Status: Appears within functional limits for tasks assessed/performed Arousal/Alertness: Awake/alert Orientation  Level: Appears intact for tasks assessed    Balance  Balance Balance Assessed: No  End of Session PT - End of Session Equipment Utilized During Treatment: Gait belt Activity Tolerance:  Patient tolerated treatment well;Patient limited by fatigue Patient left: in chair;with call bell/phone within reach Nurse Communication: Mobility status   GP     Enid Baas, SPTA 09/01/2012, 2:04 PM   Verdell Face, PTA [phone] 09/01/2012

## 2012-09-01 NOTE — Progress Notes (Signed)
Kelly Cooper, PTA [phone] 09/01/2012  

## 2012-09-02 MED ORDER — POLYETHYLENE GLYCOL 3350 17 G PO PACK
17.0000 g | PACK | Freq: Every day | ORAL | Status: DC
  Administered 2012-09-02 – 2012-09-03 (×2): 17 g via ORAL
  Filled 2012-09-02 (×6): qty 1

## 2012-09-02 MED ORDER — FLEET ENEMA 7-19 GM/118ML RE ENEM: 1.0000 | ENEMA | Freq: Every day | RECTAL | Status: DC | PRN

## 2012-09-02 MED ORDER — BISACODYL 10 MG RE SUPP
10.0000 mg | Freq: Every day | RECTAL | Status: DC | PRN
Start: 2012-09-02 — End: 2012-09-07
  Administered 2012-09-02: 10 mg via RECTAL
  Filled 2012-09-02: qty 1

## 2012-09-02 NOTE — Progress Notes (Signed)
Filed Vitals:   09/01/12 1802 09/01/12 2127 09/02/12 0245 09/02/12 0627  BP: 118/57 123/43 119/53 134/57  Pulse: 87 88 76 80  Temp: 97.9 F (36.6 C) 98.1 F (36.7 C) 98.1 F (36.7 C) 97.4 F (36.3 C)  TempSrc: Oral Oral Oral Oral  Resp: 18 18 18 18   Height:      Weight:      SpO2: 97% 100% 98% 100%     Patient resting in bed comfortably. Continuing PT and OT. Awaiting transfer to SNF for rehabilitation.  Plan: Continue current care.  Hewitt Shorts, MD 09/02/2012, 8:33 AM

## 2012-09-02 NOTE — Plan of Care (Signed)
Problem: Phase III Progression Outcomes Goal: Activity at appropriate level-compared to baseline (UP IN CHAIR FOR HEMODIALYSIS)  Outcome: Not Progressing Patient not progressing with activity; only able to be up with assistance of stedy to bedside chair. Goal: Discharge plan remains appropriate-arrangements made Outcome: Progressing Plans are in place for patient to go to SNF after discharge for strengthening purposes.

## 2012-09-03 NOTE — Clinical Social Work Note (Signed)
CSW met with pt and provided current bed offers. Pt's reports preference is either [facility] or [facility]--both of which are still considering an offer. CSW left messages with admissions coordinators for both SNFs to determine offer status. Pt aware he may need to choose from other offers if [facility] and [facility] unable to offer. Weekday CSW to f/u.  Dellie Burns, MSW, LCSWA [phone] (Weekends 8:00am-4:30pm)

## 2012-09-03 NOTE — Progress Notes (Signed)
Subjective: Patient reports "i'm going sideways."  Objective: Vital signs in last 24 hours: Temp:  [97.4 F (36.3 C)-98.7 F (37.1 C)] 98.2 F (36.8 C) (03/09 0200) Pulse Rate:  [70-89] 83 (03/09 0200) Resp:  [17-20] 20 (03/09 0200) BP: (119-137)/(56-64) 123/56 mmHg (03/09 0200) SpO2:  [95 %-100 %] 96 % (03/09 0200)  Intake/Output from previous day: 03/08 0701 - 03/09 0700 In: 200 [P.O.:200] Out: 1923 [Urine:1925] Intake/Output this shift:    Physical Exam: Stable exam.  Working with PT.  Awaiting SNF transfer.  Lab Results: No results found for this basename: WBC, HGB, HCT, PLT,  in the last 72 hours BMET No results found for this basename: NA, K, CL, CO2, GLUCOSE, BUN, CREATININE, CALCIUM,  in the last 72 hours  Studies/Results: No results found.  Assessment/Plan:  Working with PT.  Awaiting SNF transfer.    LOS: 5 days    Dorian Heckle, MD 09/03/2012, 5:24 AM

## 2012-09-03 NOTE — Plan of Care (Signed)
Problem: Phase III Progression Outcomes Goal: Discharge plan remains appropriate-arrangements made Outcome: Progressing Patient son to be visiting SNF to choose one for patient rehab.

## 2012-09-04 MED ORDER — SULFAMETHOXAZOLE-TMP DS 800-160 MG PO TABS
1.0000 | ORAL_TABLET | Freq: Two times a day (BID) | ORAL | Status: DC
  Administered 2012-09-04 (×2): 1 via ORAL
  Filled 2012-09-04 (×5): qty 1

## 2012-09-04 NOTE — Clinical Social Work Note (Signed)
Clinicial Social Work   CSW spoke with pt's son and provided updated SNF bed offers. CSW encouraged pt's son to make a decision, as pt is nearing discharge. CSW will continue to follow to facilitate discharge to SNF.   Dede Query, MSW, Theresia Majors [phone]

## 2012-09-04 NOTE — Progress Notes (Signed)
Patient ID: Robert Beltran, male   DOB: [birth year], 79 y.o.   MRN: [id number] Doing well. Some back soreness. No leg pain. Still unsteady on legs but good strength to seated exam. Awaiting SNF

## 2012-09-04 NOTE — Progress Notes (Signed)
Physical Therapy Treatment Patient Details Name: Robert Beltran MRN: [id number] DOB: Dec 02, 1923 Today's Date: 09/04/2012 Time: 1478-2956 PT Time Calculation (min): 33 min  PT Assessment / Plan / Recommendation Comments on Treatment Session  Pt with noted weakness t/o LEs L>R greatly inhibiting pt ability to stand independently and progress ambulation. Pt did demo improved standing tolerance with external support. Pt con't to benefit from SNF upon d/c from hospital for rehab.    Follow Up Recommendations  SNF;Supervision/Assistance - 24 hour     Does the patient have the potential to tolerate intense rehabilitation     Barriers to Discharge        Equipment Recommendations  None recommended by PT    Recommendations for Other Services    Frequency Min 5X/week   Plan Discharge plan remains appropriate    Precautions / Restrictions Precautions Precautions: Fall Restrictions Weight Bearing Restrictions: No   Pertinent Vitals/Pain Initially in stedy pt with 10/10 shin pain, with PT support to maintain appropriate alignment of LEs at knees (preventing ER) pt no longer with bilat shin pain in standing    Mobility  Bed Mobility Bed Mobility: Not assessed Transfers Transfers: Sit to Stand;Stand to Sit Sit to Stand: 1: +2 Total assist;With upper extremity assist;From bed Sit to Stand: Patient Percentage: 50% Stand to Sit: 1: +2 Total assist Stand to Sit: Patient Percentage: 30% Transfer via Lift Equipment: Stedy Details for Transfer Assistance: attempted standing with RW however pt unable to tolerate > 15 sec, pt able to tolerate standing in stedy x 3 for 1 min and 15 sec each with minA at hips to maintain extension and at bilat knees to prevent external rotation. Max v/c's to con't to contract gluteal mm to maintain hip ext. Ambulation/Gait Ambulation/Gait Assistance: Not tested (comment)    Exercises General Exercises - Lower Extremity Hip ABduction/ADduction:  AROM;Both;Seated;20 reps (pillow squeezes)   PT Diagnosis:    PT Problem List:   PT Treatment Interventions:     PT Goals Acute Rehab PT Goals PT Goal: Sit to Stand - Progress: Progressing toward goal PT Goal: Stand to Sit - Progress: Progressing toward goal PT Goal: Perform Home Exercise Program - Progress: Progressing toward goal  Visit Information  Last PT Received On: 09/04/12 Assistance Needed: +2    Subjective Data  Subjective: Pt received sitting up in chair agreeable to PT.   Cognition  Cognition Overall Cognitive Status: Appears within functional limits for tasks assessed/performed Arousal/Alertness: Awake/alert Orientation Level: Appears intact for tasks assessed Behavior During Session: [facility] for tasks performed Cognition - Other Comments: HOH, verbose/jovial    Balance     End of Session PT - End of Session Equipment Utilized During Treatment: Gait belt Activity Tolerance: Patient tolerated treatment well Patient left: in chair;with call bell/phone within reach Nurse Communication: Mobility status   GP     [name] 09/04/2012, 2:02 PM  Lewis Shock, PT, DPT Pager #: [phone] Office #: [phone]

## 2012-09-05 MED ORDER — DOXYCYCLINE HYCLATE 100 MG PO TABS
100.0000 mg | ORAL_TABLET | Freq: Two times a day (BID) | ORAL | Status: DC
  Administered 2012-09-05 – 2012-09-07 (×5): 100 mg via ORAL
  Filled 2012-09-05 (×6): qty 1

## 2012-09-05 MED ORDER — METHOCARBAMOL 500 MG PO TABS
500.0000 mg | ORAL_TABLET | Freq: Three times a day (TID) | ORAL | Status: DC | PRN
  Administered 2012-09-05 – 2012-09-06 (×3): 500 mg via ORAL
  Filled 2012-09-05 (×3): qty 1

## 2012-09-05 NOTE — Clinical Social Work Note (Addendum)
Clinical Social Work   CSW met with pt and pt's son to address discharge plan, prior to MD visit. Awaiting bed choice for pt's son. Pt's son would like to speak with MD prior to making final choice.   At time of note, MD has rounded on pt. CSW will follow up with pt and pt's son regarding bed choice.   Dede Query, MSW, Theresia Majors [phone]  09/05/2012 at 3:15 pm Addendum: Correction to statement above MD has not rounded at time of 1:58 PM CSW note.   CSW met with pt's son, he has chosen [facility]. Faciltiy will able to accept pt in the morning. CSW will continue to follow. Dede Query, MSW, Theresia Majors

## 2012-09-05 NOTE — Progress Notes (Signed)
Patient ID: Robert Beltran, male   DOB: 1923-10-22, 79 y.o.   MRN: [id number] Subjective: Patient reports no change.  Objective: Vital signs in last 24 hours: Temp:  [97.6 F (36.4 C)-99 F (37.2 C)] 99 F (37.2 C) (03/11 1738) Pulse Rate:  [74-90] 90 (03/11 1738) Resp:  [16-20] 18 (03/11 1738) BP: (101-136)/(47-77) 122/54 mmHg (03/11 1738) SpO2:  [95 %-98 %] 97 % (03/11 1738)  Intake/Output from previous day: 03/10 0701 - 03/11 0700 In: -  Out: 1975 [Urine:1975] Intake/Output this shift:    Neurologic: Motor: general weakness of LE continues and is unchanged  Lab Results: Lab Results  Component Value Date   WBC 9.1 08/29/2012   HGB 16.7 08/29/2012   HCT 49.0 08/29/2012   MCV 94.3 08/29/2012   PLT 168 08/29/2012   Lab Results  Component Value Date   INR 0.94 08/29/2012   BMET Lab Results  Component Value Date   NA 139 08/29/2012   K 3.7 08/29/2012   CL 101 08/29/2012   CO2 25 08/29/2012   GLUCOSE 118* 08/29/2012   BUN 29* 08/29/2012   CREATININE 1.19 08/29/2012   CALCIUM 9.1 08/29/2012    Studies/Results: No results found.  Assessment/Plan: Continue to watch wound. Continue Doxycycline. drainage is serosanguinous, but wound is red, less indurated. Leg weakness stable. Continue therapy.   LOS: 7 days    JONES,DAVID S 09/05/2012, 7:04 PM

## 2012-09-05 NOTE — Progress Notes (Signed)
Physical Therapy Treatment Patient Details Name: Robert Beltran MRN: [id number] DOB: 10/28/1923 Today's Date: 09/05/2012 Time: 4098-1191 PT Time Calculation (min): 26 min  PT Assessment / Plan / Recommendation Comments on Treatment Session  Pt slowly progressing however remains to have minimal sensation in bilat LEs limiting pt ability to contract muscles to advance mobility capabilities. Pt con't to be motivated and desires to return home alone some day. Pt to cont' to benefit from SNF placement to address mentioned deficits and to achieve maximal functional recovery.    Follow Up Recommendations  SNF;Supervision/Assistance - 24 hour     Does the patient have the potential to tolerate intense rehabilitation     Barriers to Discharge        Equipment Recommendations  None recommended by PT    Recommendations for Other Services    Frequency Min 5X/week   Plan Discharge plan remains appropriate    Precautions / Restrictions Precautions Precautions: Fall Restrictions Weight Bearing Restrictions: No   Pertinent Vitals/Pain Pt reports 8/10 surgical back pain    Mobility  Bed Mobility Bed Mobility: Rolling Right;Right Sidelying to Sit;Sitting - Scoot to [place] of Bed Rolling Right: 4: Min assist;With rail (assist for L LE management) Right Sidelying to Sit: 3: Mod assist;With rails;HOB elevated Sitting - Scoot to Edge of Bed: 3: Mod assist;With rail;Other (comment) Details for Bed Mobility Assistance: assist for L LE management due to weakness, assist for trunk elevation due to increased pain Transfers Transfers: Sit to Stand;Stand to Sit;Stand Pivot Transfers Sit to Stand: 1: +2 Total assist;With upper extremity assist;From bed Sit to Stand: Patient Percentage: 50% Stand to Sit: 1: +2 Total assist Stand to Sit: Patient Percentage: 40% Stand Pivot Transfers: 1: +2 Total assist Stand Pivot Transfers: Patient Percentage: 40% Details for Transfer Assistance: maxA to maintain hip  extension, pt able to take steps to chair this date with increased UE weight-bearing and maxAx2 to maintain stabilization at hips Ambulation/Gait Ambulation/Gait Assistance: Not tested (comment)    Exercises General Exercises - Lower Extremity Long Arc Quad: AROM;Left;10 reps;Seated Hip ABduction/ADduction: AROM;Both;Seated;20 reps   PT Diagnosis:    PT Problem List:   PT Treatment Interventions:     PT Goals Acute Rehab PT Goals PT Goal: Rolling Supine to Right Side - Progress: Progressing toward goal PT Goal: Supine/Side to Sit - Progress: Progressing toward goal PT Goal: Sit to Stand - Progress: Progressing toward goal PT Goal: Stand to Sit - Progress: Progressing toward goal PT Transfer Goal: Bed to Chair/Chair to Bed - Progress: Progressing toward goal PT Goal: Perform Home Exercise Program - Progress: Progressing toward goal  Visit Information  Last PT Received On: 09/05/12 Assistance Needed: +2    Subjective Data  Subjective: Pt received supine in bed agreeable to PT.   Cognition  Cognition Overall Cognitive Status: Appears within functional limits for tasks assessed/performed Arousal/Alertness: Awake/alert Orientation Level: Appears intact for tasks assessed Behavior During Session: WFL for tasks performed Cognition - Other Comments: jovial, HOH    Balance  Static Sitting Balance Static Sitting - Balance Support: Bilateral upper extremity supported Static Sitting - Level of Assistance: 4: Min assist Static Sitting - Comment/# of Minutes: 1 min Static Standing Balance Static Standing - Balance Support: Bilateral upper extremity supported Static Standing - Level of Assistance: 1: +2 Total assist Static Standing - Comment/# of Minutes: maxA to maintain hip extension and block bilat knees to prevent buckling. completed stands x 3 Dynamic Standing Balance Dynamic Standing - Balance Support: Bilateral  upper extremity supported Dynamic Standing - Level of Assistance:  1: +2 Total assist Dynamic Standing - Balance Activities:  (marching in place) Dynamic Standing - Comments: when taking step required assist to block contralateral knee to prevent buckling. complete 5 marches on each leg prior to fatigue  End of Session PT - End of Session Equipment Utilized During Treatment: Gait belt Activity Tolerance: Patient tolerated treatment well Patient left: in chair;with call bell/phone within reach Nurse Communication: Mobility status   GP     [name] 09/05/2012, 3:52 PM  Lewis Shock, PT, DPT Pager #: [phone] Office #: [phone]

## 2012-09-06 NOTE — Progress Notes (Signed)
Patient ID: Robert Beltran, male   DOB: 01-17-1924, 79 y.o.   MRN: [id number] Stable. No pain. No change neuro. Await SNF

## 2012-09-06 NOTE — Clinical Social Work Note (Signed)
Clinical Social Work   CSW spoke with [facility]. Facility is ready to admit pt when medically ready. Pt's son will be completing admissions paperwork at [facility]. Given time, likely discharge will be tomorrow (09/07/2012). CSW will continue to follow to facilitate discharge [facility].   Dede Query, MSW, Theresia Majors [phone]

## 2012-09-06 NOTE — Progress Notes (Signed)
Physical Therapy Treatment Patient Details Name: Robert Beltran MRN: [id number] DOB: July 15, 1923 Today's Date: 09/06/2012 Time: 4098-1191 PT Time Calculation (min): 35 min  PT Assessment / Plan / Recommendation Comments on Treatment Session  Pt continues to have difficulty with sit<>stand due to lack of feeling in LE's and difficulty using UE to achieve full upright standing.  Reviewed HEP and re-educated pt on benefits.    Follow Up Recommendations  SNF;Supervision/Assistance - 24 hour     Does the patient have the potential to tolerate intense rehabilitation     Barriers to Discharge        Equipment Recommendations  None recommended by PT    Recommendations for Other Services    Frequency Min 5X/week   Plan Discharge plan remains appropriate    Precautions / Restrictions Precautions Precautions: Fall Restrictions Weight Bearing Restrictions: No   Pertinent Vitals/Pain Pt reports back pain 7/10 with mobility    Mobility  Bed Mobility Bed Mobility: Rolling Right;Right Sidelying to Sit;Sitting - Scoot to Edge of Bed Rolling Right: 4: Min assist;With rail Right Sidelying to Sit: 3: Mod assist;With rails;HOB elevated Sitting - Scoot to Edge of Bed: 3: Mod assist;With rail;Other (comment) Details for Bed Mobility Assistance: (A) to elevate trunk and to lower LE to floor.  Cues to bend knee prior to rolling and for technique.  (A) for scooting to EOB with pull pad  Transfers Transfers: Sit to Stand;Stand to Sit;Stand Pivot Transfers Sit to Stand: 1: +2 Total assist;From elevated surface;With upper extremity assist;From bed;From chair/3-in-1;With armrests Sit to Stand: Patient Percentage: 50% Stand to Sit: 1: +2 Total assist Stand to Sit: Patient Percentage: 40% Stand Pivot Transfers: 1: +2 Total assist Stand Pivot Transfers: Patient Percentage: 40% Transfer via Lift Equipment: Stedy Details for Transfer Assistance: Max (A) sit<>stand, pt has difficulty with hip extension  and with UE weight bearing.   Pt able to take steps to chairs with cues to move legs but remains in extremely flexed posture at hips.  Pt requires pillow at shin level for stedy due to shin pain.  Pt states he can not feel where his left LE is located. Ambulation/Gait Ambulation/Gait Assistance: Not tested (comment) Wheelchair Mobility Wheelchair Mobility: No    Exercises General Exercises - Lower Extremity Long Arc Quad: AROM;Both;10 reps;Seated Hip ABduction/ADduction: AROM;Both;20 reps;Seated (with pillow btwn knees) Hip Flexion/Marching: AROM;Strengthening;Both;10 reps   PT Diagnosis:    PT Problem List:   PT Treatment Interventions:     PT Goals Acute Rehab PT Goals Time For Goal Achievement: 09/13/12 Potential to Achieve Goals: Good Pt will Roll Supine to Right Side: with supervision;with rail PT Goal: Rolling Supine to Right Side - Progress: Progressing toward goal Pt will go Supine/Side to Sit: with supervision;with HOB 0 degrees;with rail PT Goal: Supine/Side to Sit - Progress: Progressing toward goal Pt will go Sit to Stand: with min assist;with upper extremity assist PT Goal: Sit to Stand - Progress: Progressing toward goal Pt will go Stand to Sit: with min assist;with upper extremity assist PT Goal: Stand to Sit - Progress: Progressing toward goal Pt will Perform Home Exercise Program: Independently PT Goal: Perform Home Exercise Program - Progress: Progressing toward goal Additional Goals Additional Goal #1: Pt able to recall 3/3 precautions independently and be 100% compliant. PT Goal: Additional Goal #1 - Progress: Progressing toward goal  Visit Information  Last PT Received On: 09/06/12 Assistance Needed: +2    Subjective Data  Subjective: Pt agreeable to PT.  Pt indicated he  was feeling fine but was very weak.   Cognition  Cognition Overall Cognitive Status: Appears within functional limits for tasks assessed/performed Arousal/Alertness:  Awake/alert Orientation Level: Appears intact for tasks assessed Behavior During Session: [facility] for tasks performed    Balance  Static Sitting Balance Static Sitting - Balance Support: Bilateral upper extremity supported Static Sitting - Level of Assistance: 4: Min assist Pt able to maintain seated balance with lateral and anterior/posterior perturbation with UE support.  Pt able to maintain stronger resistance with rt lateral perturbation.  End of Session PT - End of Session Equipment Utilized During Treatment: Gait belt Activity Tolerance: Patient tolerated treatment well Patient left: in chair;with call bell/phone within reach Nurse Communication: Mobility status   GP     Enid Baas, SPTA 09/06/2012, 9:54 AM    Verdell Face, PTA [phone] 09/07/2012

## 2012-09-07 NOTE — Progress Notes (Signed)
Patient ID: Robert Beltran, male   DOB: Sep 17, 1923, 79 y.o.   MRN: [id number] No change. Cont PT/OT. Await SNF

## 2012-09-07 NOTE — Discharge Summary (Signed)
Physician Discharge Summary  Patient ID: Robert Beltran MRN: [id number] DOB/AGE: May 02, 1924 79 y.o.  Admit date: 08/29/2012 Discharge date: 09/07/2012  Admission Diagnoses: Thoracic stenosis with leg weakness    Discharge Diagnoses: same   Discharged Condition: fair  Hospital Course: The patient was admitted on 08/29/2012 and taken to the operating room where the patient underwent thoracic laminectomy. The patient tolerated the procedure well and was taken to the recovery room and then to the floor in stable condition. The hospital course was routine. There were no complications. The wound had some serosanguinous drainage and he was started on prophylactic antibiotics.  Pt had appropriate back soreness. No complaints of leg pain or increased N/T/W. The patient remained afebrile with stable vital signs, and tolerated a regular diet. The patient continued to increase activities, and pain was well controlled with oral pain medications. PT and OT worked with the patient and he made minimal gains.  Consults: none  Significant Diagnostic Studies:  Results for orders placed during the hospital encounter of 08/29/12  SURGICAL PCR SCREEN      Result Value Range   MRSA, PCR NEGATIVE  NEGATIVE   Staphylococcus aureus NEGATIVE  NEGATIVE  BASIC METABOLIC PANEL      Result Value Range   Sodium 137  135 - 145 mEq/L   Potassium 4.0  3.5 - 5.1 mEq/L   Chloride 101  96 - 112 mEq/L   CO2 25  19 - 32 mEq/L   Glucose, Bld 128 (*) 70 - 99 mg/dL   BUN 29 (*) 6 - 23 mg/dL   Creatinine, Ser 2.72  0.50 - 1.35 mg/dL   Calcium 9.1  8.4 - 53.6 mg/dL   GFR calc non Af Amer 53 (*) >90 mL/min   GFR calc Af Amer 62 (*) >90 mL/min  CBC WITH DIFFERENTIAL      Result Value Range   WBC 9.1  4.0 - 10.5 K/uL   RBC 5.05  4.22 - 5.81 MIL/uL   Hemoglobin 16.9  13.0 - 17.0 g/dL   HCT 64.4  03.4 - 74.2 %   MCV 94.3  78.0 - 100.0 fL   MCH 33.5  26.0 - 34.0 pg   MCHC 35.5  30.0 - 36.0 g/dL   RDW 59.5  63.8 - 75.6 %   Platelets 168  150 - 400 K/uL   Neutrophils Relative 80 (*) 43 - 77 %   Neutro Abs 7.3  1.7 - 7.7 K/uL   Lymphocytes Relative 12  12 - 46 %   Lymphs Abs 1.0  0.7 - 4.0 K/uL   Monocytes Relative 7  3 - 12 %   Monocytes Absolute 0.6  0.1 - 1.0 K/uL   Eosinophils Relative 1  0 - 5 %   Eosinophils Absolute 0.1  0.0 - 0.7 K/uL   Basophils Relative 0  0 - 1 %   Basophils Absolute 0.0  0.0 - 0.1 K/uL  PROTIME-INR      Result Value Range   Prothrombin Time 12.5  11.6 - 15.2 seconds   INR 0.94  0.00 - 1.49  GLUCOSE, CAPILLARY      Result Value Range   Glucose-Capillary 125 (*) 70 - 99 mg/dL  GLUCOSE, CAPILLARY      Result Value Range   Glucose-Capillary 119 (*) 70 - 99 mg/dL  POCT I-STAT 4, (NA,K, GLUC, HGB,HCT)      Result Value Range   Sodium 139  135 - 145 mEq/L   Potassium 3.7  3.5 - 5.1 mEq/L   Glucose, Bld 118 (*) 70 - 99 mg/dL   HCT 16.1  09.6 - 04.5 %   Hemoglobin 16.7  13.0 - 17.0 g/dL    Chest 2 View  4/0/9811  *RADIOLOGY REPORT*  Clinical Data: Diabetic.  Preoperative lumbar surgery examination. No chest complaints.  Hypertensive.  CHEST - 2 VIEW  Comparison: None.  Findings: Calcified tortuous aorta.  Heart size within normal limits.  No infiltrate, congestive heart failure or pneumothorax.  Right shoulder joint degenerative changes.  Degenerative changes lower thoracic spine with disc space narrowing.  IMPRESSION: No acute abnormality.  Please see above.   Original Report Authenticated By: Lacy Duverney, M.D.    Dg Lumbar Spine 2-3 Views  08/29/2012  *RADIOLOGY REPORT*  Clinical Data: Intraoperative localization for spine surgery.  LUMBAR SPINE - 2-3 VIEW  Comparison: Thoracic spine MRI and 07/31/2012.  Findings: Lateral spine film from the operating room labeled #1 demonstrates a spinal needle marking the T12 level.  Film labeled #2 demonstrates a surgical instrument marking the T11 level.  IMPRESSION: T12 and T11 marked intraoperatively.   Original Report Authenticated By: Rudie Meyer, M.D.     Antibiotics:  Anti-infectives   Start     Dose/Rate Route Frequency Ordered Stop   09/05/12 1100  doxycycline (VIBRA-TABS) tablet 100 mg     100 mg Oral Every 12 hours 09/05/12 0925     09/04/12 1300  sulfamethoxazole-trimethoprim (BACTRIM DS) 800-160 MG per tablet 1 tablet  Status:  Discontinued     1 tablet Oral Every 12 hours 09/04/12 1250 09/05/12 0925   08/31/12 0600  ceFAZolin (ANCEF) IVPB 2 g/50 mL premix     2 g 100 mL/hr over 30 Minutes Intravenous On call to O.R. 08/28/12 1421 08/29/12 0747   08/29/12 1600  ceFAZolin (ANCEF) IVPB 1 g/50 mL premix     1 g 100 mL/hr over 30 Minutes Intravenous Every 8 hours 08/29/12 1211 08/30/12 0034   08/29/12 0817  bacitracin 50,000 Units in sodium chloride irrigation 0.9 % 500 mL irrigation  Status:  Discontinued       As needed 08/29/12 0818 08/29/12 0912   08/29/12 0707  bacitracin 91478 UNITS injection    Comments:  STEELMAN, CRAIG: cabinet override      08/29/12 0707 08/29/12 1914   08/29/12 0612  ceFAZolin (ANCEF) 2-3 GM-% IVPB SOLR    Comments:  SAVAGE, Israel Werts: cabinet override      08/29/12 0612 08/29/12 1814      Discharge Exam: Blood pressure 120/46, pulse 81, temperature 98.5 F (36.9 C), temperature source Oral, resp. rate 18, height 5' 6.5" (1.689 m), weight 77.111 kg (170 lb), SpO2 95.00%.  Antigravity strength in legs   Discharge Medications:     Medication List    TAKE these medications       chlorthalidone 25 MG tablet  Commonly known as:  HYGROTON  Take 25 mg by mouth daily.     ergocalciferol 50000 UNITS capsule  Commonly known as:  VITAMIN D2  Take 50,000 Units by mouth once a week.     metFORMIN 500 MG tablet  Commonly known as:  GLUCOPHAGE  Take 500 mg by mouth 2 (two) times daily with a meal.     multivitamin with minerals Tabs  Take 1 tablet by mouth daily.     PRILOSEC OTC PO  Take 20 mg by mouth daily.     ramipril 10 MG capsule  Commonly known as:  ALTACE  Take 10  mg by mouth daily.     rOPINIRole 0.25 MG tablet  Commonly known as:  REQUIP  Take 0.25-1 mg by mouth daily as needed. For restless leg     rosuvastatin 20 MG tablet  Commonly known as:  CRESTOR  Take 20 mg by mouth daily.     traMADol 50 MG tablet  Commonly known as:  ULTRAM  Take 50 mg by mouth every 6 (six) hours as needed for pain.        Disposition: SNF   Final Dx: thoracic laminectomy for stenosis      Discharge Orders   Future Orders Complete By Expires     Call MD for:  difficulty breathing, headache or visual disturbances  As directed     Call MD for:  persistant nausea and vomiting  As directed     Call MD for:  redness, tenderness, or signs of infection (pain, swelling, redness, odor or green/yellow discharge around incision site)  As directed     Call MD for:  severe uncontrolled pain  As directed     Call MD for:  temperature >100.4  As directed     Diet - low sodium heart healthy  As directed     Increase activity slowly  As directed     Remove dressing in 48 hours  As directed        Follow-up Information   Follow up with JONES,DAVID S, MD. Schedule an appointment as soon as possible for a visit in 3 weeks.   Contact information:   [address] [place] [address] [phone]        Signed: Tia Alert 09/07/2012, 4:24 PM

## 2012-09-07 NOTE — Clinical Social Work Note (Signed)
Clinical Social Work   Pt is ready for discharge to [facility]. Facility has received discharge summary and is ready to accept pt. Pt and family are agreeable to discharge plan. [facility] will provide transportation. CSW is signing off as no further needs identified.   Dede Query, MSW, Theresia Majors [phone]

## 2012-09-07 NOTE — Progress Notes (Signed)
Physical Therapy Treatment Patient Details Name: Robert Beltran MRN: [id number] DOB: [birth year] Today's Date: 09/07/2012 Time: [id number] PT Time Calculation (min): 29 min  PT Assessment / Plan / Recommendation Comments on Treatment Session  Pt performed sit<>stand with improved technique and posture.  Pt able to maintain upright standing for up to 30 seconds with (A) & RW.  Pt instructed and demonstrated LE exercises in supine.  Rt LE is weaker than Lt LE.    Follow Up Recommendations  SNF;Supervision/Assistance - 24 hour     Does the patient have the potential to tolerate intense rehabilitation     Barriers to Discharge        Equipment Recommendations  None recommended by PT    Recommendations for Other Services    Frequency Min 5X/week   Plan Discharge plan remains appropriate    Precautions / Restrictions Precautions Precautions: Fall Restrictions Weight Bearing Restrictions: No   Pertinent Vitals/Pain     Mobility  Bed Mobility Bed Mobility: Rolling Right;Right Sidelying to Sit;Sitting - Scoot to [place] of Bed Rolling Right: 4: Min assist Right Sidelying to Sit: 4: Min assist;With rails;HOB elevated Sitting - Scoot to Edge of Bed: 4: Min assist;With rail Details for Bed Mobility Assistance: Min (A) to elevate trunk & to lower LE to floor.  Cues to maintain log roll. Transfers Transfers: Sit to Stand;Stand to Sit Sit to Stand: With upper extremity assist;From bed;From elevated surface;1: +2 Total assist Sit to Stand: Patient Percentage: 60% Stand to Sit: 1: +2 Total assist Stand to Sit: Patient Percentage: 50% Details for Transfer Assistance: Sit<>Stand 5x's.  Stood for 30 sec for 2 trials.  Cues for upright posture.  Pt tends to stand with slightly extended back.   Ambulation/Gait Ambulation/Gait Assistance: Not tested (comment) Wheelchair Mobility Wheelchair Mobility: Yes    Exercises General Exercises - Lower Extremity Quad Sets: AROM;Both;10 reps;Supine Heel  Slides: AROM;Strengthening;Both;10 reps;Supine Hip ABduction/ADduction: AROM;Strengthening;Both;10 reps;Supine   PT Diagnosis:    PT Problem List:   PT Treatment Interventions:     PT Goals Acute Rehab PT Goals Time For Goal Achievement: 09/13/12 Potential to Achieve Goals: Good Pt will Roll Supine to Right Side: with supervision;with rail PT Goal: Rolling Supine to Right Side - Progress: Progressing toward goal Pt will go Supine/Side to Sit: with supervision;with HOB 0 degrees;with rail PT Goal: Supine/Side to Sit - Progress: Progressing toward goal Pt will go Sit to Stand: with min assist;with upper extremity assist PT Goal: Sit to Stand - Progress: Progressing toward goal Pt will go Stand to Sit: with min assist;with upper extremity assist PT Goal: Stand to Sit - Progress: Progressing toward goal Pt will Perform Home Exercise Program: Independently PT Goal: Perform Home Exercise Program - Progress: Progressing toward goal Additional Goals Additional Goal #1: Pt able to recall 3/3 precautions independently and be 100% compliant. PT Goal: Additional Goal #1 - Progress: Progressing toward goal  Visit Information  Last PT Received On: 09/07/12 Assistance Needed: +2    Subjective Data  Subjective: Pt initially indicated he did not want to participate in PT today, but was persuaded to see what he was able to do today.   Cognition  Cognition Overall Cognitive Status: Appears within functional limits for tasks assessed/performed Arousal/Alertness: Awake/alert Orientation Level: Appears intact for tasks assessed Behavior During Session: [facility] for tasks performed Cognition - Other Comments: Pt pleasant, concerned about being pushed too hard.    Balance     End of Session PT - End of Session  Equipment Utilized During Treatment: Gait belt Activity Tolerance: Patient tolerated treatment well Patient left: in bed;with call bell/phone within reach   GP     Enid Baas,  SPTA 09/07/2012, 11:51 AM

## 2012-09-07 NOTE — Progress Notes (Signed)
Pt and pt's son seem unprepared for patients discharge tonight,  Although it has been discussed with them.  Not sure as to why they are upset,  Unable to get a specific reason from patient and patients son is on phone at this time.

## 2012-09-08 NOTE — Care Management Note (Signed)
    Page 1 of 1   09/08/2012     8:12:23 AM   CARE MANAGEMENT NOTE 09/08/2012  Patient:  SAVOY, SOMERVILLE   Account Number:  [id number]  Date Initiated:  08/29/2012  Documentation initiated by:  [facility]  Subjective/Objective Assessment:   admitted postop T10-11, T11-12 laminectomy     Action/Plan:   PT/OT evals-recommending SNF   Anticipated DC Date:  09/07/2012   Anticipated DC Plan:  SKILLED NURSING FACILITY  In-house referral  Clinical Social Worker      DC Planning Services  CM consult      Choice offered to / List presented to:             Status of service:  Completed, signed off Medicare Important Message given?   (If response is "NO", the following Medicare IM given date fields will be blank) Date Medicare IM given:   Date Additional Medicare IM given:    Discharge Disposition:  SKILLED NURSING FACILITY  Per UR Regulation:  Reviewed for med. necessity/level of care/duration of stay  If discussed at Long Length of Stay Meetings, dates discussed:   09/05/2012  09/08/2011    Comments:  08/30/2012 1500 Waiting final decision from pt about HH vs SNF. Isidoro Donning RN CCM Case Mgmt phone [phone]

## 2012-09-12 NOTE — Progress Notes (Signed)
Kelly Cooper, PTA [phone] 09/12/2012  

## 2012-09-25 ENCOUNTER — Encounter (HOSPITAL_BASED_OUTPATIENT_CLINIC_OR_DEPARTMENT_OTHER): Payer: Medicare Other | Attending: General Surgery

## 2012-09-25 DIAGNOSIS — L988 Other specified disorders of the skin and subcutaneous tissue: Secondary | ICD-10-CM | POA: Insufficient documentation

## 2012-09-25 DIAGNOSIS — E119 Type 2 diabetes mellitus without complications: Secondary | ICD-10-CM | POA: Insufficient documentation

## 2012-09-25 DIAGNOSIS — Z8547 Personal history of malignant neoplasm of testis: Secondary | ICD-10-CM | POA: Insufficient documentation

## 2012-09-25 DIAGNOSIS — T8189XA Other complications of procedures, not elsewhere classified, initial encounter: Secondary | ICD-10-CM | POA: Insufficient documentation

## 2012-09-25 DIAGNOSIS — Y838 Other surgical procedures as the cause of abnormal reaction of the patient, or of later complication, without mention of misadventure at the time of the procedure: Secondary | ICD-10-CM | POA: Insufficient documentation

## 2012-09-25 DIAGNOSIS — I10 Essential (primary) hypertension: Secondary | ICD-10-CM | POA: Insufficient documentation

## 2012-09-26 NOTE — Progress Notes (Signed)
[facility]  NAME:  JUSTUN, ANAYA NO.:  [id number]  MEDICAL RECORD NO.:  [id number]      DATE OF BIRTH:  08-17-1923  PHYSICIAN:  Ardath Sax, M.D.           VISIT DATE:                                  OFFICE VISIT   Mr. Kirkendall is an 79 year old gentleman who comes to [place] because of a nonhealing surgical wound following a laminectomy for spinal stenosis at L12.  He is a very bright and alert 79 year old.  He does have hypertension and he is on lisinopril.  Other than that, he takes metformin for type 2 diabetes.  He also takes Lexapro and Requip and chlorthalidone.  He is also on tramadol.  He has a history of having testicular cancer which was treated with surgery and radiation.  Today, I examined him, and he was found to have a temperature of 98, pulse 83, respirations 16, blood pressure 129/73.  He weighs 168 pounds.  He has a wound over T12 that is about 2 cm long and 3 mm wide.  It has some necrotic tissue, mostly devascularized skin which I debrided, and then we are treating him with silver collagen.  I will have him come back here in a week and we will check it again.  He will be able to take showers and change the dressing himself.  We gave him some silver alginate to use at home.  DIAGNOSES: 1. Nonhealing surgical wound of the back following a laminectomy. 2. Type 2 diabetes. 3. Hypertension. 4. History of testicular cancer.     Ardath Sax, M.D.     PP/MEDQ  D:  09/25/2012  T:  09/26/2012  Job:  [id number]

## 2012-10-02 ENCOUNTER — Encounter (HOSPITAL_BASED_OUTPATIENT_CLINIC_OR_DEPARTMENT_OTHER): Payer: Medicare Other | Attending: General Surgery

## 2012-10-02 DIAGNOSIS — T8189XA Other complications of procedures, not elsewhere classified, initial encounter: Secondary | ICD-10-CM | POA: Insufficient documentation

## 2012-10-02 DIAGNOSIS — Y838 Other surgical procedures as the cause of abnormal reaction of the patient, or of later complication, without mention of misadventure at the time of the procedure: Secondary | ICD-10-CM | POA: Insufficient documentation

## 2012-10-30 ENCOUNTER — Encounter (HOSPITAL_BASED_OUTPATIENT_CLINIC_OR_DEPARTMENT_OTHER): Payer: Medicare Other | Attending: General Surgery

## 2012-10-30 DIAGNOSIS — Y838 Other surgical procedures as the cause of abnormal reaction of the patient, or of later complication, without mention of misadventure at the time of the procedure: Secondary | ICD-10-CM | POA: Insufficient documentation

## 2012-10-30 DIAGNOSIS — T8189XA Other complications of procedures, not elsewhere classified, initial encounter: Secondary | ICD-10-CM | POA: Insufficient documentation

## 2012-12-04 ENCOUNTER — Encounter (HOSPITAL_BASED_OUTPATIENT_CLINIC_OR_DEPARTMENT_OTHER): Payer: Medicare Other | Attending: General Surgery

## 2012-12-20 ENCOUNTER — Encounter (INDEPENDENT_AMBULATORY_CARE_PROVIDER_SITE_OTHER): Payer: Medicare Other | Admitting: *Deleted

## 2012-12-20 DIAGNOSIS — I803 Phlebitis and thrombophlebitis of lower extremities, unspecified: Secondary | ICD-10-CM

## 2012-12-20 DIAGNOSIS — M79609 Pain in unspecified limb: Secondary | ICD-10-CM

## 2012-12-20 DIAGNOSIS — M7989 Other specified soft tissue disorders: Secondary | ICD-10-CM

## 2013-02-05 ENCOUNTER — Other Ambulatory Visit: Payer: Self-pay | Admitting: Neurological Surgery

## 2013-02-05 DIAGNOSIS — M4804 Spinal stenosis, thoracic region: Secondary | ICD-10-CM

## 2013-02-08 ENCOUNTER — Ambulatory Visit
Admission: RE | Admit: 2013-02-08 | Discharge: 2013-02-08 | Disposition: A | Discharge status: Home / Self Care | Payer: Medicare Other | Source: Ambulatory Visit | Attending: Neurological Surgery | Admitting: Neurological Surgery

## 2013-02-08 DIAGNOSIS — M4804 Spinal stenosis, thoracic region: Secondary | ICD-10-CM

## 2013-10-26 ENCOUNTER — Ambulatory Visit (HOSPITAL_COMMUNITY)
Admission: RE | Admit: 2013-10-26 | Discharge: 2013-10-26 | Disposition: A | Discharge status: Home / Self Care | Payer: Medicare Other | Source: Ambulatory Visit | Attending: Surgery | Admitting: Surgery

## 2013-10-26 ENCOUNTER — Other Ambulatory Visit (HOSPITAL_COMMUNITY): Payer: Self-pay | Admitting: Internal Medicine

## 2013-10-26 DIAGNOSIS — I82409 Acute embolism and thrombosis of unspecified deep veins of unspecified lower extremity: Secondary | ICD-10-CM

## 2013-10-26 DIAGNOSIS — Z86718 Personal history of other venous thrombosis and embolism: Secondary | ICD-10-CM

## 2013-10-26 DIAGNOSIS — M79609 Pain in unspecified limb: Secondary | ICD-10-CM | POA: Insufficient documentation

## 2015-08-25 ENCOUNTER — Encounter (HOSPITAL_COMMUNITY): Payer: Self-pay | Admitting: Emergency Medicine

## 2015-08-25 ENCOUNTER — Emergency Department (HOSPITAL_COMMUNITY)
Admission: EM | Admit: 2015-08-25 | Discharge: 2015-08-26 | Disposition: A | Discharge status: Home / Self Care | Payer: Medicare Other | Attending: Emergency Medicine | Admitting: Emergency Medicine

## 2015-08-25 DIAGNOSIS — Z79899 Other long term (current) drug therapy: Secondary | ICD-10-CM | POA: Diagnosis not present

## 2015-08-25 DIAGNOSIS — S0181XA Laceration without foreign body of other part of head, initial encounter: Secondary | ICD-10-CM | POA: Insufficient documentation

## 2015-08-25 DIAGNOSIS — K219 Gastro-esophageal reflux disease without esophagitis: Secondary | ICD-10-CM | POA: Insufficient documentation

## 2015-08-25 DIAGNOSIS — W01198A Fall on same level from slipping, tripping and stumbling with subsequent striking against other object, initial encounter: Secondary | ICD-10-CM | POA: Diagnosis not present

## 2015-08-25 DIAGNOSIS — Y998 Other external cause status: Secondary | ICD-10-CM | POA: Diagnosis not present

## 2015-08-25 DIAGNOSIS — Z7984 Long term (current) use of oral hypoglycemic drugs: Secondary | ICD-10-CM | POA: Diagnosis not present

## 2015-08-25 DIAGNOSIS — Y92010 Kitchen of single-family (private) house as the place of occurrence of the external cause: Secondary | ICD-10-CM | POA: Insufficient documentation

## 2015-08-25 DIAGNOSIS — IMO0002 Reserved for concepts with insufficient information to code with codable children: Secondary | ICD-10-CM

## 2015-08-25 DIAGNOSIS — Z87438 Personal history of other diseases of male genital organs: Secondary | ICD-10-CM | POA: Insufficient documentation

## 2015-08-25 DIAGNOSIS — Z87891 Personal history of nicotine dependence: Secondary | ICD-10-CM | POA: Insufficient documentation

## 2015-08-25 DIAGNOSIS — Y9389 Activity, other specified: Secondary | ICD-10-CM | POA: Diagnosis not present

## 2015-08-25 DIAGNOSIS — W19XXXA Unspecified fall, initial encounter: Secondary | ICD-10-CM

## 2015-08-25 DIAGNOSIS — I10 Essential (primary) hypertension: Secondary | ICD-10-CM | POA: Insufficient documentation

## 2015-08-25 DIAGNOSIS — S0990XA Unspecified injury of head, initial encounter: Secondary | ICD-10-CM

## 2015-08-25 DIAGNOSIS — Z8739 Personal history of other diseases of the musculoskeletal system and connective tissue: Secondary | ICD-10-CM | POA: Diagnosis not present

## 2015-08-25 DIAGNOSIS — E119 Type 2 diabetes mellitus without complications: Secondary | ICD-10-CM | POA: Diagnosis not present

## 2015-08-25 DIAGNOSIS — Z23 Encounter for immunization: Secondary | ICD-10-CM | POA: Insufficient documentation

## 2015-08-25 DIAGNOSIS — Z8547 Personal history of malignant neoplasm of testis: Secondary | ICD-10-CM | POA: Insufficient documentation

## 2015-08-25 DIAGNOSIS — G8929 Other chronic pain: Secondary | ICD-10-CM | POA: Diagnosis not present

## 2015-08-25 NOTE — ED Notes (Signed)
Per EMS pt was at home and slipped on the kitchen floor and fell striking his head on the counter  Pt crawled to his button to call for help  Denies LOC  Denies neck of back pain  Pt has a laceration noted to his forehead  Bleeding controlled  At this time  Pupils equal and reactive  Pt is not on any blood thinners

## 2015-08-26 ENCOUNTER — Emergency Department (HOSPITAL_COMMUNITY): Payer: Medicare Other

## 2015-08-26 MED ORDER — LIDOCAINE-EPINEPHRINE (PF) 2 %-1:200000 IJ SOLN
20.0000 mL | Freq: Once | INTRAMUSCULAR | Status: AC
  Administered 2015-08-26: 20 mL
  Filled 2015-08-26: qty 20

## 2015-08-26 MED ORDER — TETANUS-DIPHTH-ACELL PERTUSSIS 5-2.5-18.5 LF-MCG/0.5 IM SUSP
0.5000 mL | Freq: Once | INTRAMUSCULAR | Status: AC
  Administered 2015-08-26: 0.5 mL via INTRAMUSCULAR
  Filled 2015-08-26: qty 0.5

## 2015-08-26 MED ORDER — BACITRACIN ZINC 500 UNIT/GM EX OINT
TOPICAL_OINTMENT | CUTANEOUS | Status: AC
  Filled 2015-08-26: qty 0.9

## 2015-08-26 NOTE — ED Provider Notes (Signed)
Patient presented to the ER with fall. Patient slipped on kitchen floor and fell forward, striking his head on the counter. He has a laceration on the middle of his 4. No loss of consciousness.  Face to face Exam: HEENT - PERRLA Lungs - CTAB Heart - RRR, no M/R/G Abd - S/NT/ND Neuro - alert, oriented x3  Plan: Patient alert and oriented. He is without complaints currently other than the laceration. CT head and cervical spine performed. Sutures placed.  Orpah Greek, MD 08/26/15 [phone]

## 2015-08-26 NOTE — Discharge Instructions (Signed)
Concussion, Adult °A concussion, or closed-head injury, is a brain injury caused by a direct blow to the head or by a quick and sudden movement (jolt) of the head or neck. Concussions are usually not life-threatening. Even so, the effects of a concussion can be serious. If you have had a concussion before, you are more likely to experience concussion-like symptoms after a direct blow to the head.  °CAUSES °· Direct blow to the head, such as from running into another player during a soccer game, being hit in a fight, or hitting your head on a hard surface. °· A jolt of the head or neck that causes the brain to move back and forth inside the skull, such as in a car crash. °SIGNS AND SYMPTOMS °The signs of a concussion can be hard to notice. Early on, they may be missed by you, family members, and health care providers. You may look fine but act or feel differently. °Symptoms are usually temporary, but they may last for days, weeks, or even longer. Some symptoms may appear right away while others may not show up for hours or days. Every head injury is different. Symptoms include: °· Mild to moderate headaches that will not go away. °· A feeling of pressure inside your head. °· Having more trouble than usual: °· Learning or remembering things you have heard. °· Answering questions. °· Paying attention or concentrating. °· Organizing daily tasks. °· Making decisions and solving problems. °· Slowness in thinking, acting or reacting, speaking, or reading. °· Getting lost or being easily confused. °· Feeling tired all the time or lacking energy (fatigued). °· Feeling drowsy. °· Sleep disturbances. °· Sleeping more than usual. °· Sleeping less than usual. °· Trouble falling asleep. °· Trouble sleeping (insomnia). °· Loss of balance or feeling lightheaded or dizzy. °· Nausea or vomiting. °· Numbness or tingling. °· Increased sensitivity to: °· Sounds. °· Lights. °· Distractions. °· Vision problems or eyes that tire  easily. °· Diminished sense of taste or smell. °· Ringing in the ears. °· Mood changes such as feeling sad or anxious. °· Becoming easily irritated or angry for little or no reason. °· Lack of motivation. °· Seeing or hearing things other people do not see or hear (hallucinations). °DIAGNOSIS °Your health care provider can usually diagnose a concussion based on a description of your injury and symptoms. He or she will ask whether you passed out (lost consciousness) and whether you are having trouble remembering events that happened right before and during your injury. °Your evaluation might include: °· A brain scan to look for signs of injury to the brain. Even if the test shows no injury, you may still have a concussion. °· Blood tests to be sure other problems are not present. °TREATMENT °· Concussions are usually treated in an emergency department, in urgent care, or at a clinic. You may need to stay in the hospital overnight for further treatment. °· Tell your health care provider if you are taking any medicines, including prescription medicines, over-the-counter medicines, and natural remedies. Some medicines, such as blood thinners (anticoagulants) and aspirin, may increase the chance of complications. Also tell your health care provider whether you have had alcohol or are taking illegal drugs. This information may affect treatment. °· Your health care provider will send you home with important instructions to follow. °· How fast you will recover from a concussion depends on many factors. These factors include how severe your concussion is, what part of your brain was injured,   your age, and how healthy you were before the concussion. °· Most people with mild injuries recover fully. Recovery can take time. In general, recovery is slower in older persons. Also, persons who have had a concussion in the past or have other medical problems may find that it takes longer to recover from their current injury. °HOME  CARE INSTRUCTIONS °General Instructions °· Carefully follow the directions your health care provider gave you. °· Only take over-the-counter or prescription medicines for pain, discomfort, or fever as directed by your health care provider. °· Take only those medicines that your health care provider has approved. °· Do not drink alcohol until your health care provider says you are well enough to do so. Alcohol and certain other drugs may slow your recovery and can put you at risk of further injury. °· If it is harder than usual to remember things, write them down. °· If you are easily distracted, try to do one thing at a time. For example, do not try to watch TV while fixing dinner. °· Talk with family members or close friends when making important decisions. °· Keep all follow-up appointments. Repeated evaluation of your symptoms is recommended for your recovery. °· Watch your symptoms and tell others to do the same. Complications sometimes occur after a concussion. Older adults with a brain injury may have a higher risk of serious complications, such as a blood clot on the brain. °· Tell your teachers, school nurse, school counselor, coach, athletic trainer, or work manager about your injury, symptoms, and restrictions. Tell them about what you can or cannot do. They should watch for: °¨ Increased problems with attention or concentration. °¨ Increased difficulty remembering or learning new information. °¨ Increased time needed to complete tasks or assignments. °¨ Increased irritability or decreased ability to cope with stress. °¨ Increased symptoms. °· Rest. Rest helps the brain to heal. Make sure you: °¨ Get plenty of sleep at night. Avoid staying up late at night. °¨ Keep the same bedtime hours on weekends and weekdays. °¨ Rest during the day. Take daytime naps or rest breaks when you feel tired. °· Limit activities that require a lot of thought or concentration. These include: °¨ Doing homework or job-related  work. °¨ Watching TV. °¨ Working on the computer. °· Avoid any situation where there is potential for another head injury (football, hockey, soccer, basketball, martial arts, downhill snow sports and horseback riding). Your condition will get worse every time you experience a concussion. You should avoid these activities until you are evaluated by the appropriate follow-up health care providers. °Returning To Your Regular Activities °You will need to return to your normal activities slowly, not all at once. You must give your body and brain enough time for recovery. °· Do not return to sports or other athletic activities until your health care provider tells you it is safe to do so. °· Ask your health care provider when you can drive, ride a bicycle, or operate heavy machinery. Your ability to react may be slower after a brain injury. Never do these activities if you are dizzy. °· Ask your health care provider about when you can return to work or school. °Preventing Another Concussion °It is very important to avoid another brain injury, especially before you have recovered. In rare cases, another injury can lead to permanent brain damage, brain swelling, or death. The risk of this is greatest during the first 7-10 days after a head injury. Avoid injuries by: °· Wearing a   seat belt when riding in a car. °· Drinking alcohol only in moderation. °· Wearing a helmet when biking, skiing, skateboarding, skating, or doing similar activities. °· Avoiding activities that could lead to a second concussion, such as contact or recreational sports, until your health care provider says it is okay. °· Taking safety measures in your home. °¨ Remove clutter and tripping hazards from floors and stairways. °¨ Use grab bars in bathrooms and handrails by stairs. °¨ Place non-slip mats on floors and in bathtubs. °¨ Improve lighting in dim areas. °SEEK MEDICAL CARE IF: °· You have increased problems paying attention or  concentrating. °· You have increased difficulty remembering or learning new information. °· You need more time to complete tasks or assignments than before. °· You have increased irritability or decreased ability to cope with stress. °· You have more symptoms than before. °Seek medical care if you have any of the following symptoms for more than 2 weeks after your injury: °· Lasting (chronic) headaches. °· Dizziness or balance problems. °· Nausea. °· Vision problems. °· Increased sensitivity to noise or light. °· Depression or mood swings. °· Anxiety or irritability. °· Memory problems. °· Difficulty concentrating or paying attention. °· Sleep problems. °· Feeling tired all the time. °SEEK IMMEDIATE MEDICAL CARE IF: °· You have severe or worsening headaches. These may be a sign of a blood clot in the brain. °· You have weakness (even if only in one hand, leg, or part of the face). °· You have numbness. °· You have decreased coordination. °· You vomit repeatedly. °· You have increased sleepiness. °· One pupil is larger than the other. °· You have convulsions. °· You have slurred speech. °· You have increased confusion. This may be a sign of a blood clot in the brain. °· You have increased restlessness, agitation, or irritability. °· You are unable to recognize people or places. °· You have neck pain. °· It is difficult to wake you up. °· You have unusual behavior changes. °· You lose consciousness. °MAKE SURE YOU: °· Understand these instructions. °· Will watch your condition. °· Will get help right away if you are not doing well or get worse. °  °This information is not intended to replace advice given to you by your health care provider. Make sure you discuss any questions you have with your health care provider. °  °Document Released: 09/04/2003 Document Revised: 07/05/2014 Document Reviewed: 01/04/2013 °Elsevier Interactive Patient Education ©2016 [employer] ° °Facial Laceration ° A facial laceration is a cut  on the face. These injuries can be painful and cause bleeding. Lacerations usually heal quickly, but they need special care to reduce scarring. °DIAGNOSIS  °Your health care provider will take a medical history, ask for details about how the injury occurred, and examine the wound to determine how deep the cut is. °TREATMENT  °Some facial lacerations may not require closure. Others may not be able to be closed because of an increased risk of infection. The risk of infection and the chance for successful closure will depend on various factors, including the amount of time since the injury occurred. °The wound may be cleaned to help prevent infection. If closure is appropriate, pain medicines may be given if needed. Your health care provider will use stitches (sutures), wound glue (adhesive), or skin adhesive strips to repair the laceration. These tools bring the skin edges together to allow for faster healing and a better cosmetic outcome. If needed, you may also be given a tetanus   shot. °HOME CARE INSTRUCTIONS °· Only take over-the-counter or prescription medicines as directed by your health care provider. °· Follow your health care provider's instructions for wound care. These instructions will vary depending on the technique used for closing the wound. °For Sutures: °· Keep the wound clean and dry.   °· If you were given a bandage (dressing), you should change it at least once a day. Also change the dressing if it becomes wet or dirty, or as directed by your health care provider.   °· Wash the wound with soap and water 2 times a day. Rinse the wound off with water to remove all soap. Pat the wound dry with a clean towel.   °· After cleaning, apply a thin layer of the antibiotic ointment recommended by your health care provider. This will help prevent infection and keep the dressing from sticking.   °· You may shower as usual after the first 24 hours. Do not soak the wound in water until the sutures are removed.    °· Get your sutures removed as directed by your health care provider. With facial lacerations, sutures should usually be taken out after 4-5 days to avoid stitch marks.   °· Wait a few days after your sutures are removed before applying any makeup. °For Skin Adhesive Strips: °· Keep the wound clean and dry.   °· Do not get the skin adhesive strips wet. You may bathe carefully, using caution to keep the wound dry.   °· If the wound gets wet, pat it dry with a clean towel.   °· Skin adhesive strips will fall off on their own. You may trim the strips as the wound heals. Do not remove skin adhesive strips that are still stuck to the wound. They will fall off in time.   °For Wound Adhesive: °· You may briefly wet your wound in the shower or bath. Do not soak or scrub the wound. Do not swim. Avoid periods of heavy sweating until the skin adhesive has fallen off on its own. After showering or bathing, gently pat the wound dry with a clean towel.   °· Do not apply liquid medicine, cream medicine, ointment medicine, or makeup to your wound while the skin adhesive is in place. This may loosen the film before your wound is healed.   °· If a dressing is placed over the wound, be careful not to apply tape directly over the skin adhesive. This may cause the adhesive to be pulled off before the wound is healed.   °· Avoid prolonged exposure to sunlight or tanning lamps while the skin adhesive is in place. °· The skin adhesive will usually remain in place for 5-10 days, then naturally fall off the skin. Do not pick at the adhesive film.   °After Healing: °Once the wound has healed, cover the wound with sunscreen during the day for 1 full year. This can help minimize scarring. Exposure to ultraviolet light in the first year will darken the scar. It can take 1-2 years for the scar to lose its redness and to heal completely.  °SEEK MEDICAL CARE IF: °· You have a fever. °SEEK IMMEDIATE MEDICAL CARE IF: °· You have redness, pain, or  swelling around the wound.   °· You see a yellowish-white fluid (pus) coming from the wound.   °  °This information is not intended to replace advice given to you by your health care provider. Make sure you discuss any questions you have with your health care provider. °  °Document Released: 07/22/2004 Document Revised: 07/05/2014 Document Reviewed: 01/25/2013 °Elsevier Interactive Patient   Education ©2016 [employer] ° °

## 2015-08-26 NOTE — ED Provider Notes (Signed)
CSN: [id number]     Arrival date & time 08/25/15  2335 History   First MD Initiated Contact with Patient 08/25/15 2351     Chief Complaint  Patient presents with  . Fall  . Head Injury     (Consider location/radiation/quality/duration/timing/severity/associated sxs/prior Treatment) HPI Comments: Patient presents to the emergency department with chief complaint of mechanical fall. He states that he slipped on the kitchen floor after applying lotion to his legs. He struck his head on the countertop, and sustained a laceration to his forehead. He denies any LOC. He complains of mild headache. He is not anticoagulated. He denies any numbness, weakness, or tingling in his extremities. Denies any other injuries. Denies any chest pain or shortness of breath. There are no aggravating or alleviating factors. He has not taken anything for her symptoms.  The history is provided by the patient. No language interpreter was used.    Past Medical History  Diagnosis Date  . Hypertension     sees Dr. Crist Infante,   . Diabetes mellitus without complication ([place])   . Benign prostate hyperplasia   . GERD (gastroesophageal reflux disease)   . Cancer [facility])     testicular cancer 1964  . Arthritis   . Chronic back pain    Past Surgical History  Procedure Laterality Date  . Eye surgery      bilateral surgery  . Appendectomy      1995  . Shoulder surgery      right  . Lumbar laminectomy/decompression microdiscectomy N/A 08/29/2012    Procedure: LUMBAR LAMINECTOMY/DECOMPRESSION MICRODISCECTOMY 2 LEVELS;  Surgeon: Eustace Moore, MD;  Location: [facility];  Service: Neurosurgery;  Laterality: N/A;   History reviewed. No pertinent family history. Social History  Substance Use Topics  . Smoking status: Former Research scientist (life sciences)  . Smokeless tobacco: None     Comment: 1981  . Alcohol Use: No     Comment: "occas"    Review of Systems  All other systems reviewed and are negative.     Allergies  Aspirin;  Nsaids; Other; and Bactrim  Home Medications   Prior to Admission medications   Medication Sig Start Date End Date Taking? Authorizing Provider  chlorthalidone (HYGROTON) 25 MG tablet Take 25 mg by mouth daily.    Historical Provider, MD  ergocalciferol (VITAMIN D2) 50000 UNITS capsule Take 50,000 Units by mouth once a week.    Historical Provider, MD  metFORMIN (GLUCOPHAGE) 500 MG tablet Take 500 mg by mouth 2 (two) times daily with a meal.    Historical Provider, MD  Multiple Vitamin (MULTIVITAMIN WITH MINERALS) TABS Take 1 tablet by mouth daily.    Historical Provider, MD  Omeprazole Magnesium (PRILOSEC OTC PO) Take 20 mg by mouth daily.    Historical Provider, MD  ramipril (ALTACE) 10 MG capsule Take 10 mg by mouth daily.    Historical Provider, MD  rOPINIRole (REQUIP) 0.25 MG tablet Take 0.25-1 mg by mouth daily as needed. For restless leg    Historical Provider, MD  rosuvastatin (CRESTOR) 20 MG tablet Take 20 mg by mouth daily.    Historical Provider, MD  traMADol (ULTRAM) 50 MG tablet Take 50 mg by mouth every 6 (six) hours as needed for pain.    Historical Provider, MD   BP 153/58 mmHg  Pulse 73  Temp(Src) 98.1 F (36.7 C) (Oral)  Resp 18  SpO2 92% Physical Exam  Constitutional: He is oriented to person, place, and time. He appears well-developed and well-nourished.  HENT:  Head: Normocephalic and atraumatic.  3 cm laceration to forehead, bleeding controlled, no other injuries about the head, neck, or face  Eyes: Conjunctivae and EOM are normal. Pupils are equal, round, and reactive to light. Right eye exhibits no discharge. Left eye exhibits no discharge. No scleral icterus.  Neck: Normal range of motion. Neck supple. No JVD present.  No cervical spine tenderness  Cardiovascular: Normal rate, regular rhythm and normal heart sounds.  Exam reveals no gallop and no friction rub.   No murmur heard. Pulmonary/Chest: Effort normal and breath sounds normal. No respiratory distress.  He has no wheezes. He has no rales. He exhibits no tenderness.  Abdominal: Soft. He exhibits no distension and no mass. There is no tenderness. There is no rebound and no guarding.  Musculoskeletal: Normal range of motion. He exhibits no edema or tenderness.  Normal range of motion and strength throughout  Neurological: He is alert and oriented to person, place, and time.  CN III-12 intact Speech is clear Movements are goal oriented  Skin: Skin is warm and dry.  Psychiatric: He has a normal mood and affect. His behavior is normal. Judgment and thought content normal.  Nursing note and vitals reviewed.   ED Course  Procedures (including critical care time) Labs Review Labs Reviewed - No data to display  Imaging Review Ct Head Wo Contrast  08/26/2015  CLINICAL DATA:  Slip and fell in kitchen floor, struck head on counter. Forehead laceration. History of hypertension, diabetes, cancer. EXAM: CT HEAD WITHOUT CONTRAST CT CERVICAL SPINE WITHOUT CONTRAST TECHNIQUE: Multidetector CT imaging of the head and cervical spine was performed following the standard protocol without intravenous contrast. Multiplanar CT image reconstructions of the cervical spine were also generated. COMPARISON:  None. FINDINGS: CT HEAD FINDINGS The ventricles and sulci are normal for age. No intraparenchymal hemorrhage, mass effect nor midline shift. Patchy supratentorial white matter hypodensities are less than expected for patient's age and though non-specific suggest sequelae of chronic small vessel ischemic disease. No acute large vascular territory infarcts. LEFT inferior basal ganglia perivascular space. No abnormal extra-axial fluid collections. Basal cisterns are patent. Moderate calcific atherosclerosis of the carotid siphons. No skull fracture. Small RIGHT frontal scalp hematoma without subcutaneous gas or radiopaque foreign bodies. The included ocular globes and orbital contents are non-suspicious. Status post  bilateral ocular lens implant. The mastoid aircells and included paranasal sinuses are well-aerated. CT CERVICAL SPINE FINDINGS Cervical vertebral bodies intact. Straightened cervical lordosis. Grade 1 C2-3 anterolisthesis, grade 1 C7-T1 anterolisthesis on degenerative basis. Severe C3-4, C4-5, C5-6 and C6-7 disc height loss, with C3-4 interbody arthrodesis. Severe endplate spurring 075-GRM, C4-5. Multilevel uncovertebral hypertrophy and moderate facet arthropathy. Moderate to severe RIGHT C2-3, severe bilateral C3-4, severe RIGHT C4-5, severe LEFT C5-6, moderate to severe RIGHT C6-7 neural foraminal narrowing. Mild canal stenosis C3-4 through C5-6. No prevertebral soft tissue swelling. Moderate to severe RIGHT, severe LEFT calcific atherosclerosis of the carotid bulbs can result in hemodynamically significant stenosis. At least 4.9 x 3.5 cm calcified mass contiguous with the RIGHT glenohumeral joint, partially imaged. IMPRESSION: CT HEAD: Small RIGHT frontal scalp hematoma.  No skull fracture. Otherwise negative CT head for age. CT CERVICAL SPINE: Straightened cervical lordosis without acute fracture. Grade 1 C2-3 anterolisthesis, grade 1 C7-T1 anterolisthesis on degenerative basis. Multilevel severe neural foraminal narrowing. Mild canal stenosis C3-4 through C5-6. Partially imaged at least 4.9 x 3.5 cm RIGHT shoulder calcified mass could represent loose body though, recommend dedicated RIGHT shoulder radiograph on a nonemergent  basis. Electronically Signed   By: Elon Alas M.D.   On: 08/26/2015 00:43   Ct Cervical Spine Wo Contrast  08/26/2015  CLINICAL DATA:  Slip and fell in kitchen floor, struck head on counter. Forehead laceration. History of hypertension, diabetes, cancer. EXAM: CT HEAD WITHOUT CONTRAST CT CERVICAL SPINE WITHOUT CONTRAST TECHNIQUE: Multidetector CT imaging of the head and cervical spine was performed following the standard protocol without intravenous contrast. Multiplanar CT image  reconstructions of the cervical spine were also generated. COMPARISON:  None. FINDINGS: CT HEAD FINDINGS The ventricles and sulci are normal for age. No intraparenchymal hemorrhage, mass effect nor midline shift. Patchy supratentorial white matter hypodensities are less than expected for patient's age and though non-specific suggest sequelae of chronic small vessel ischemic disease. No acute large vascular territory infarcts. LEFT inferior basal ganglia perivascular space. No abnormal extra-axial fluid collections. Basal cisterns are patent. Moderate calcific atherosclerosis of the carotid siphons. No skull fracture. Small RIGHT frontal scalp hematoma without subcutaneous gas or radiopaque foreign bodies. The included ocular globes and orbital contents are non-suspicious. Status post bilateral ocular lens implant. The mastoid aircells and included paranasal sinuses are well-aerated. CT CERVICAL SPINE FINDINGS Cervical vertebral bodies intact. Straightened cervical lordosis. Grade 1 C2-3 anterolisthesis, grade 1 C7-T1 anterolisthesis on degenerative basis. Severe C3-4, C4-5, C5-6 and C6-7 disc height loss, with C3-4 interbody arthrodesis. Severe endplate spurring 075-GRM, C4-5. Multilevel uncovertebral hypertrophy and moderate facet arthropathy. Moderate to severe RIGHT C2-3, severe bilateral C3-4, severe RIGHT C4-5, severe LEFT C5-6, moderate to severe RIGHT C6-7 neural foraminal narrowing. Mild canal stenosis C3-4 through C5-6. No prevertebral soft tissue swelling. Moderate to severe RIGHT, severe LEFT calcific atherosclerosis of the carotid bulbs can result in hemodynamically significant stenosis. At least 4.9 x 3.5 cm calcified mass contiguous with the RIGHT glenohumeral joint, partially imaged. IMPRESSION: CT HEAD: Small RIGHT frontal scalp hematoma.  No skull fracture. Otherwise negative CT head for age. CT CERVICAL SPINE: Straightened cervical lordosis without acute fracture. Grade 1 C2-3 anterolisthesis, grade 1  C7-T1 anterolisthesis on degenerative basis. Multilevel severe neural foraminal narrowing. Mild canal stenosis C3-4 through C5-6. Partially imaged at least 4.9 x 3.5 cm RIGHT shoulder calcified mass could represent loose body though, recommend dedicated RIGHT shoulder radiograph on a nonemergent basis. Electronically Signed   By: Elon Alas M.D.   On: 08/26/2015 00:43   I have personally reviewed and evaluated these images and lab results as part of my medical decision-making.   EKG Interpretation None     LACERATION REPAIR Performed by: Montine Circle Authorized by: Montine Circle Consent: Verbal consent obtained. Risks and benefits: risks, benefits and alternatives were discussed Consent given by: patient Patient identity confirmed: provided demographic data Prepped and Draped in normal sterile fashion Wound explored  Laceration Location: Forehead  Laceration Length: 3 cm  No Foreign Bodies seen or palpated  Anesthesia: local infiltration  Local anesthetic: lidocaine 2 % with epinephrine  Anesthetic total: 3 ml  Irrigation method: syringe Amount of cleaning: standard  Skin closure: 5-0 Prolene   Number of sutures: 6   Technique: Interrupted   Patient tolerance: Patient tolerated the procedure well with no immediate complications.  MDM   Final diagnoses:  Fall, initial encounter  Head injury, initial encounter  Laceration    Patient with mechanical fall and head injury, will check CT head and cervical spine. Will update tetanus, and repair laceration. He is not anticoagulated. No syncope or loss of consciousness. No other symptoms at this time. Patient seen  by and discussed with Dr. Betsey Holiday, who agrees with the plan.  Laceration repaired. CT of head and neck are unremarkable for emergent process. Patient is stable and ready for discharge to home. Sutures out in 5 days by primary care.   Montine Circle, PA-C 08/26/15 [id number]  Orpah Greek,  MD 08/26/15 [phone]

## 2015-08-26 NOTE — ED Notes (Signed)
[facility] called for transport back to facility 

## 2015-10-30 ENCOUNTER — Other Ambulatory Visit: Payer: Self-pay | Admitting: Internal Medicine

## 2015-10-30 ENCOUNTER — Ambulatory Visit
Admission: RE | Admit: 2015-10-30 | Discharge: 2015-10-30 | Disposition: A | Discharge status: Home / Self Care | Payer: Medicare Other | Source: Ambulatory Visit | Attending: Internal Medicine | Admitting: Internal Medicine

## 2015-10-30 ENCOUNTER — Inpatient Hospital Stay
Admission: RE | Admit: 2015-10-30 | Discharge: 2015-10-30 | Disposition: A | Discharge status: Home / Self Care | Payer: Medicare Other | Source: Ambulatory Visit | Attending: Internal Medicine | Admitting: Internal Medicine

## 2015-10-30 DIAGNOSIS — S0990XA Unspecified injury of head, initial encounter: Secondary | ICD-10-CM

## 2019-09-14 ENCOUNTER — Encounter: Payer: Self-pay | Admitting: Neurology

## 2019-09-17 ENCOUNTER — Encounter: Payer: Self-pay | Admitting: Neurology

## 2019-09-17 ENCOUNTER — Ambulatory Visit (INDEPENDENT_AMBULATORY_CARE_PROVIDER_SITE_OTHER): Payer: Medicare Other | Admitting: Neurology

## 2019-09-17 ENCOUNTER — Other Ambulatory Visit: Payer: Self-pay

## 2019-09-17 VITALS — BP 113/61 | HR 75 | Temp 97.9°F | Ht 67.0 in | Wt 165.0 lb

## 2019-09-17 DIAGNOSIS — I447 Left bundle-branch block, unspecified: Secondary | ICD-10-CM

## 2019-09-17 DIAGNOSIS — M48062 Spinal stenosis, lumbar region with neurogenic claudication: Secondary | ICD-10-CM | POA: Diagnosis not present

## 2019-09-17 DIAGNOSIS — R252 Cramp and spasm: Secondary | ICD-10-CM

## 2019-09-17 DIAGNOSIS — E119 Type 2 diabetes mellitus without complications: Secondary | ICD-10-CM | POA: Insufficient documentation

## 2019-09-17 DIAGNOSIS — E1121 Type 2 diabetes mellitus with diabetic nephropathy: Secondary | ICD-10-CM | POA: Diagnosis not present

## 2019-09-17 DIAGNOSIS — R351 Nocturia: Secondary | ICD-10-CM | POA: Diagnosis not present

## 2019-09-17 DIAGNOSIS — G4719 Other hypersomnia: Secondary | ICD-10-CM | POA: Insufficient documentation

## 2019-09-17 DIAGNOSIS — E1122 Type 2 diabetes mellitus with diabetic chronic kidney disease: Secondary | ICD-10-CM | POA: Insufficient documentation

## 2019-09-17 DIAGNOSIS — N1831 Chronic kidney disease, stage 3a: Secondary | ICD-10-CM

## 2019-09-17 DIAGNOSIS — R0683 Snoring: Secondary | ICD-10-CM | POA: Insufficient documentation

## 2019-09-17 NOTE — Patient Instructions (Signed)

## 2019-09-17 NOTE — Progress Notes (Signed)
SLEEP MEDICINE CLINIC    Provider:  Larey Seat, MD  Primary Care Physician:  Crist Infante, MD [place] [address]     Referring Provider: Crist Infante, [place] [address] [place],  [place] [address]          Chief Complaint according to patient   Patient presents with:    . New Patient (Initial Visit)     pt , 86 year -old , here with son, who notices that he has episodes where he stops breathing in sleep and snores. pt states he can't sleep. The patient is nodding of all the time, sleeping much more in daytime, he is also a loud snorer.       HISTORY OF PRESENT ILLNESS:  Robert Beltran is a 86 year old Caucasian male patient , and seen  on 09/17/2019 for an evaluation of daytime somnolence.   Chief concern according to patient :    86 year-old gentleman, here with his son, who notices that he has episodes where he stops breathing in sleep and snores. pt states he can't sleep. The patient is nodding of all the time, sleeping much more in daytime, he is also a loud snorer. He urinates 6-8 times at night (!)      Robert Beltran is  a right -handed White or Caucasian male with  a  medical history of Arthritis, Benign prostate hyperplasia, testicular Cancer in 1962 [facility]), Chronic back pain, Depression, Diabetes mellitus without complication ([place]), DJD (degenerative joint disease), GERD (gastroesophageal reflux disease), HLD (hyperlipidemia), and Hypertension. He had severe lumbar spinal stenosis, had laminectomy in 2014 with Sherley Bounds, MD. Before he was falling, and weak.    Sleep relevant medical history: Nocturia/ snoring, sleep is much fragmented and he is extremely daytime fatigued and sleepy.    Family medical /sleep history: Son on CPAP with OSA.    Social history:  Patient is retired since 1984 from [employer],  and lives in a household alone in independent living - at [employer].  Family status is widowed with 1 adult son.The  pa Pets are not present. Tobacco use; quit in 1981, pipe smoking.   ETOH use- not now , Caffeine intake in form of Coffee( 2-3 in AM ) Soda( none ) Tea ( rare ) , and no energy drinks.Regular exercise in form of walking with the walker.     Sleep habits are as follows: The patient's dinner time is between 5 PM. The patient goes to bed at 9.30-11 PM and continues to sleep for 1 hour, wakes for many, many bathroom breaks, the first time at 1 hour into sleep.   The preferred sleep position is on his right side, but also on supine with the support of 1 pillow. He likes the TV on all night. Dreams are reportedly infrequent/vivid. No falls out of bed due to dreams.  He also endorsed leg pain at night- only left- ever since surgery- laminectomy in 2014. He wakes up and gets a shot of whiskey.  6. 15  AM is the usual rise time.  The patient wakes up spontaneously/at 6 Am and goes to breakfast. After breakfast he takes a nap and this lasts about 2-3 hours(!). He skips lunch- naps again after dinner.  He reports not feeling refreshed or restored in AM for a long time , with symptoms such as dry mouth, but not headaches, and always residual fatigue.  Naps are taken frequently,  several times a day, usually lasting hours.    Review of Systems: Out of a complete 14 system review, the patient complains of only the following symptoms, and all other reviewed systems are negative.:  Fatigue, sleepiness , snoring, fragmented sleep, Insomnia - leg pain left, laminectomy, he stated its not so much pain, rather spasms.  He has spinal stenosis, he had muscle atrophy of the quadriceps and weakness with walking. On a walker now.    How likely are you to doze in the following situations: 0 = not likely, 1 = slight chance, 2 = moderate chance, 3 = high chance   Sitting and Reading? Watching Television? Sitting inactive in a public place (theater or meeting)? As a passenger in a car for an hour without a break? Lying  down in the afternoon when circumstances permit? Sitting and talking to someone? Sitting quietly after lunch without alcohol? In a car, while stopped for a few minutes in traffic?   Total =  16 / 24 points   FSS endorsed at 26/ 63 points.   Social History   Socioeconomic History  . Marital status: Widowed    Spouse name: Not on file  . Number of children: Not on file  . Years of education: Not on file  . Highest education level: Not on file  Occupational History  . Not on file  Tobacco Use  . Smoking status: Former Research scientist (life sciences)  . Smokeless tobacco: Never Used  . Tobacco comment: 1981  Substance and Sexual Activity  . Alcohol use: Yes    Comment: "occas"  . Drug use: No  . Sexual activity: Not on file  Other Topics Concern  . Not on file  Social History Narrative  . Not on file   Social Determinants of Health   Financial Resource Strain:   . Difficulty of Paying Living Expenses:   Food Insecurity:   . Worried About Charity fundraiser in the Last Year:   . Arboriculturist in the Last Year:   Transportation Needs:   . Film/video editor (Medical):   Marland Kitchen Lack of Transportation (Non-Medical):   Physical Activity:   . Days of Exercise per Week:   . Minutes of Exercise per Session:   Stress:   . Feeling of Stress :   Social Connections:   . Frequency of Communication with Friends and Family:   . Frequency of Social Gatherings with Friends and Family:   . Attends Religious Services:   . Active Member of Clubs or Organizations:   . Attends Archivist Meetings:   Marland Kitchen Marital Status:     Family History  Problem Relation Age of Onset  . Heart attack Mother   . Colon cancer Brother     Past Medical History:  Diagnosis Date  . Arthritis   . Benign prostate hyperplasia   . Cancer [facility])    testicular cancer 1964  . Chronic back pain   . Depression   . Diabetes mellitus without complication ([place])   . DJD (degenerative joint disease)   . GERD  (gastroesophageal reflux disease)   . HLD (hyperlipidemia)   . Hypertension    sees Dr. Crist Infante,     Past Surgical History:  Procedure Laterality Date  . APPENDECTOMY     1995  . EYE SURGERY     bilateral surgery  . LUMBAR LAMINECTOMY/DECOMPRESSION MICRODISCECTOMY N/A 08/29/2012   Procedure: LUMBAR LAMINECTOMY/DECOMPRESSION MICRODISCECTOMY 2 LEVELS;  Surgeon: Eustace Moore, MD;  Location: [place] [facility];  Service: Neurosurgery;  Laterality: N/A;  . SHOULDER SURGERY     right     Current Outpatient Medications on File Prior to Visit  Medication Sig Dispense Refill  . amLODipine (NORVASC) 5 MG tablet Take 5 mg by mouth daily.    . candesartan (ATACAND) 8 MG tablet Take 8 mg by mouth in the morning and at bedtime.    . chlorthalidone (HYGROTON) 25 MG tablet Take 25 mg by mouth daily.    . ergocalciferol (VITAMIN D2) 50000 UNITS capsule Take 50,000 Units by mouth once a week.    . escitalopram (LEXAPRO) 5 MG tablet Take 5 mg by mouth daily.    Marland Kitchen levothyroxine (SYNTHROID) 75 MCG tablet Take 75 mcg by mouth daily before breakfast.    . metFORMIN (GLUCOPHAGE) 500 MG tablet Take 500 mg by mouth 2 (two) times daily with a meal.    . Multiple Vitamin (MULTIVITAMIN WITH MINERALS) TABS Take 1 tablet by mouth daily.    . Omeprazole Magnesium (PRILOSEC OTC PO) Take 20 mg by mouth daily.    . polyethylene glycol (MIRALAX / GLYCOLAX) 17 g packet Take 17 g by mouth daily.    . ramipril (ALTACE) 10 MG capsule Take 10 mg by mouth daily.    Marland Kitchen rOPINIRole (REQUIP) 0.25 MG tablet Take 0.25-1 mg by mouth daily as needed. For restless leg    . rosuvastatin (CRESTOR) 20 MG tablet Take 20 mg by mouth daily.    . traMADol (ULTRAM) 50 MG tablet Take 50 mg by mouth every 6 (six) hours as needed for pain.     No current facility-administered medications on file prior to visit.    Allergies  Allergen Reactions  . Aspirin Other (See Comments)    GI bleed  . Nsaids Other (See Comments)    GI distress  .  Other Nausea Only    Coricidin  . Bactrim [Sulfamethoxazole-Trimethoprim] Rash    Physical exam:  Today's Vitals   09/17/19 1045  BP: 113/61  Pulse: 75  Temp: 97.9 F (36.6 C)  Weight: 165 lb (74.8 kg)  Height: 5\' 7"  (1.702 m)   Body mass index is 25.84 kg/m.   Wt Readings from Last 3 Encounters:  09/17/19 165 lb (74.8 kg)  08/29/12 170 lb (77.1 kg)  08/21/12 170 lb (77.1 kg)     Ht Readings from Last 3 Encounters:  09/17/19 5\' 7"  (1.702 m)  08/29/12 5' 6.5" (1.689 m)  08/21/12 5\' 6"  (1.676 m)      General: The patient is awake, alert and appears not in acute distress. The patient is well groomed. Head: Normocephalic, atraumatic. Neck is supple. Mallampati 2- very short angle.,  neck circumference:16. 5 inches . Nasal airflow patent.  Retrognathia is  seen.  Dental status:  Cardiovascular:  Regular rate and cardiac rhythm by pulse,  without distended neck veins. Respiratory: Lungs are clear to auscultation.  Skin:  Without evidence of ankle edema, or rash. Trunk: The patient's posture is erect.   Neurologic exam : The patient is awake and alert, oriented to place and time.   Memory subjective described as intact.  Attention span & concentration ability appears normal.  Speech is fluent,  without  dysarthria, dysphonia or aphasia.  Mood and affect are appropriate.   Cranial nerves: no loss of smell or taste reported  Pupils are equal and briskly reactive to light. Funduscopic exam deferred. .  Extraocular movements in vertical and horizontal planes were intact  and without nystagmus. No Diplopia. Visual fields by finger perimetry are intact. Hearing was intact to soft voice and finger rubbing. Facial sensation intact to fine touch. Facial motor strength is symmetric and tongue and uvula move midline.  Neck ROM : rotation, tilt and flexion extension were normal for age and shoulder shrug was symmetrical.    Motor exam:  Symmetric bulk, tone and ROM.   Normal tone  without cog wheeling, symmetric grip strength . Sensory:  Fine touch, pinprick and vibration were tested  and  normal.  Proprioception tested in the upper extremities was normal. Coordination: Rapid alternating movements in the fingers/hands were of normal speed.  The Finger-to-nose maneuver was intact without evidence of ataxia, dysmetria or tremor. Gait and station: Patient could only walk with a walker assistive device.  He has fallen without the walker.  He is bending over, he cant stand erect.  Toe and heel walk were deferred.  Deep tendon reflexes: in the  upper and lower extremities are symmetric and intact.  Babinski response was deferred.      Dr. Silvestre Mesi note states that the patient had undergone a spinal stenosis laminectomy surgery which allowed him to walk with a walker, he had disorders of bone density of structure on multiple sides remains on therapeutic vitamin D supplements and also has a bone scan pending for bone density the last bone density test was actually in normal limits.  He has also a pressure ulcer a tendency to develop sores on buttocks and hips since he is mostly resting or sitting.  #3 he has diabetes mellitus type 2 which is controlled but he does have some renal involvement.  This may account for part of his nocturia as well however his hemoglobin A1c was 5.8 which is excellent control, blood pressure is a slightly elevated 146/81 and his last visit with Dr. Haynes Kerns.  He has microalbuminuria, his GFR was 56?   Given his very low muscle mass I think that his GFR is more likely in the 40s.  However he received steroid injection into both knees, these have not helped him, he had also hyaluronic acid shots.  Hypertension is controlled at least today here 8 113/61 mmHg Dr. Haynes Kerns noted that the chronic kidney disease is more likely related to hypertension.  He rated his kidney disease at stage III which is moderate, he also mention benign prostatic hypertrophy which can  lead to nocturia, he mentioned depression which is treated with a very low dose of S-Citalopram, this mood problem is not evident here today.   History of chronic hypothyroidism on levothyroxine no adjustment in dose for years.   He also placed a restless leg syndrome on the patient's diagnosis code but what Mr. Percle explained is that this is more likely a spinal stenosis sequela.   So there is some back pain number but there is really much more of a spinal stenosis related muscle atrophy.  The patient still had very good abduction abduction and hip flexion strength.  He cannot stand erect however and has a stooped posture.  He is snoring and he may have severe OSA so we are checking him with a sleep study.    After spending a total time of 48  minutes face to face and additional time for physical and neurologic examination, review of laboratory studies,  personal review of imaging studies, reports and results of other testing and review of referral information / records as far as provided in visit, I have established  the following Plan:     My Plan is to proceed with:  1) Attended sleep study preferred.  He will need a bedtime urinal. He preferred Bed 2.  2) Reducing daytime naps to 30 minute- and after lunch only. He may have to set an alarm. He may need more stimulation. Covid affected his group activities.  3) get more water and protein, don't skip lunch, just eat lighter.   I would like to thank Crist Infante, MD  for allowing me to meet with and to take care of this pleasant patient.   In short, Robert Beltran is presenting with sequale of spinal stenosis, manifesting as mostly left leg cramping, and with snoring, EDS. , a symptom that can be attributed to OSA and sleep hygiene.  I plan to follow up either personally  within 2-3 month.   CC: I will share my notes with Dr. Joylene Draft  Electronically signed by: Larey Seat, MD 09/17/2019 11:04 AM  [facility] and  [employer] Board certified by The [employer] of Sleep Medicine and Diplomate of the [employer] of Sleep Medicine. Board certified In Neurology through the [employer], Fellow of the [employer] of Neurology. Medical Director of [employer].

## 2019-09-25 ENCOUNTER — Telehealth: Payer: Self-pay

## 2019-09-25 NOTE — Telephone Encounter (Signed)
Left message with son, Domanique, to schedule patient for a sleep study.

## 2019-10-17 ENCOUNTER — Ambulatory Visit (INDEPENDENT_AMBULATORY_CARE_PROVIDER_SITE_OTHER): Payer: Medicare Other | Admitting: Neurology

## 2019-10-17 ENCOUNTER — Other Ambulatory Visit: Payer: Self-pay

## 2019-10-17 DIAGNOSIS — I447 Left bundle-branch block, unspecified: Secondary | ICD-10-CM

## 2019-10-17 DIAGNOSIS — G4719 Other hypersomnia: Secondary | ICD-10-CM

## 2019-10-17 DIAGNOSIS — R351 Nocturia: Secondary | ICD-10-CM

## 2019-10-17 DIAGNOSIS — R0683 Snoring: Secondary | ICD-10-CM

## 2019-10-17 DIAGNOSIS — G4733 Obstructive sleep apnea (adult) (pediatric): Secondary | ICD-10-CM

## 2019-10-17 DIAGNOSIS — R252 Cramp and spasm: Secondary | ICD-10-CM

## 2019-10-17 DIAGNOSIS — M48062 Spinal stenosis, lumbar region with neurogenic claudication: Secondary | ICD-10-CM

## 2019-10-17 DIAGNOSIS — E1122 Type 2 diabetes mellitus with diabetic chronic kidney disease: Secondary | ICD-10-CM

## 2019-10-29 DIAGNOSIS — G4733 Obstructive sleep apnea (adult) (pediatric): Secondary | ICD-10-CM | POA: Insufficient documentation

## 2019-10-29 NOTE — Addendum Note (Signed)
Addended by: Larey Seat on: 10/29/2019 02:51 PM   Modules accepted: Orders

## 2019-10-29 NOTE — Progress Notes (Signed)
The patient took breaks to use the bedside urinal up to twice an hour, causing severely fragmented sleep and abnormal sleep architecture. EKG was in keeping with normal sinus rhythm (NSR) and many PVCs.  IMPRESSION:  1. Severe Obstructive Sleep Apnea (OSA) at AHI of 75.1/h. 2. Moderate Periodic Limb Movement Disorder (PLMD) 3. Nocturia related insomnia, sleep fragmentation.  4. Non-specific abnormal EKG  RECOMMENDATIONS:  1. Advise urgent return for a full-night, attended, PAP titration study to optimize therapy. If the patient prefers an autotitration, we can arrange for that, too.

## 2019-10-29 NOTE — Procedures (Signed)
PATIENT'S NAME:  Robert Beltran, Robert Beltran DOB:      13-May-1924      MR#:    [id number]     DATE OF RECORDING: 10/17/2019 MR REFERRING M.D.:  Crist Infante MD Study Performed:   Baseline Polysomnogram HISTORY:  Robert Beltran is a 86 year-old, right -handed Caucasian patient with a medical history of Urinary frequency, Benign prostate hyperplasia, testicular Cancer in [facility]), Chronic back pain, Depression, Diabetes mellitus without complication ([place]), DJD (degenerative joint disease), GERD (gastroesophageal reflux disease), HLD (hyperlipidemia), and Hypertension. He had severe lumbar spinal stenosis, had laminectomy in 2014 with Sherley Bounds, MD. Before surgery he was falling frequently but he has remained somewhat weak.   Sleep relevant medical history: Nocturia/ Snoring, Sleep is very fragmented and he is extremely daytime fatigued and sleepy.   The patient endorsed the Epworth Sleepiness Scale at 16 points.   The patient's weight 165 pounds with a height of 67 (inches), resulting in a BMI of 26. kg/m2. The patient's neck circumference measured 16.5 inches.  CURRENT MEDICATIONS: Norvasc, Atacand, Hygroton, Vitamin D2, Lexapro, Synthroid, Glucophage, Multivitamins, Prilosec, Miralax, Altace, Crestor, Ultram   PROCEDURE:  This is a multichannel digital polysomnogram utilizing the Somnostar 11.2 system.  Electrodes and sensors were applied and monitored per AASM Specifications.   EEG, EOG, Chin and Limb EMG, were sampled at 200 Hz.  ECG, Snore and Nasal Pressure, Thermal Airflow, Respiratory Effort, CPAP Flow and Pressure, Oximetry was sampled at 50 Hz. Digital video and audio were recorded.      BASELINE STUDY: Lights Out was at 21:24 and Lights On at 03:30.  Total recording time (TRT) was 366.5 minutes, with a total sleep time (TST) of 171 minutes.  The patient's sleep latency was 24.5 minutes.  REM latency was 0 minutes.  The sleep efficiency was poor due to Nocturia at only 46.7 %.     SLEEP ARCHITECTURE:  WASO (Wake after sleep onset) was 190.5 minutes.  There were 45.5 minutes in Stage N1, 125.5 minutes Stage N2, 0 minutes Stage N3 and 0 minutes in Stage REM.  The percentage of Stage N1 was 26.6%, Stage N2 was 73.4%, Stage N3 and Stage R (REM sleep) void.    RESPIRATORY ANALYSIS:  There were a total of 214 respiratory events:  172 obstructive apneas, 0 central apneas and 0 mixed apneas with a total of 172 apneas and an apnea index (AI) of 60.4 /hour. There were 42 hypopneas with a hypopnea index of 14.7 /hour.     The total APNEA/HYPOPNEA INDEX (AHI) was 75.1/h.  0 events occurred in REM sleep and 84 events in NREM.. The patient spent 171 minutes of total sleep time in the supine position and 0 minutes in non-supine..   OXYGEN SATURATION & C02:  The Wake baseline 02 saturation was 90%, with the lowest being 85%. Time spent below 89% saturation equaled 14 minutes.  The arousals were noted as: 68 were spontaneous, 10 were associated with PLMs, 168 were associated with respiratory events.   PERIODIC LIMB MOVEMENTS:   The patient had a total of 33 Periodic Limb Movements.  The Periodic Limb Movement (PLM) Arousal index was 3.5/hour. Audio and video analysis did not show any abnormal or unusual movements, behaviors, phonations or vocalizations.   The patient took breaks to use the bedside urinal up to twice an hour, causing severely fragmented sleep and abnormal sleep architecture. EKG was in keeping with normal sinus rhythm (NSR) and many PVCs.  IMPRESSION:  1. Severe  Obstructive Sleep Apnea (OSA) at AHI of 75.1/h. 2. Moderate Periodic Limb Movement Disorder (PLMD) 3. Nocturia related insomnia, sleep fragmentation.  4. Non-specific abnormal EKG  RECOMMENDATIONS:  1. Advise urgent return for a full-night, attended, PAP titration study to optimize therapy. If the patient prefers an autotitration, we can arrange for that, too.     I certify that I have reviewed the entire raw data recording prior  to the issuance of this report in accordance with the Standards of Accreditation of the American Academy of Sleep Medicine (AASM)    Larey Seat, MD Diplomat, American Board of Psychiatry and Neurology  Diplomat, American Board of Sleep Medicine Market researcher, [place] [facility] [employer]

## 2019-10-30 ENCOUNTER — Telehealth: Payer: Self-pay | Admitting: Neurology

## 2019-10-30 NOTE — Telephone Encounter (Signed)
-----   Message from Larey Seat, MD sent at 10/29/2019  2:51 PM EDT ----- The patient took breaks to use the bedside urinal up to twice an hour, causing severely fragmented sleep and abnormal sleep architecture. EKG was in keeping with normal sinus rhythm (NSR) and many PVCs.  IMPRESSION:  1. Severe Obstructive Sleep Apnea (OSA) at AHI of 75.1/h. 2. Moderate Periodic Limb Movement Disorder (PLMD) 3. Nocturia related insomnia, sleep fragmentation.  4. Non-specific abnormal EKG  RECOMMENDATIONS:  1. Advise urgent return for a full-night, attended, PAP titration study to optimize therapy. If the patient prefers an autotitration, we can arrange for that, too.

## 2019-10-30 NOTE — Telephone Encounter (Signed)
I called pt's son. I advised pt that Dr. Brett Fairy reviewed their sleep study results and found that has severe sleep apnea and recommends that pt be treated with a cpap. Reviewed in detail the report and what the findings indicated. Dr. Brett Fairy recommends that pt return for a repeat sleep study in order to properly titrate the cpap and ensure a good mask fit. Pt's son is agreeable to returning for a titration study but wants to discuss further with the patient. I advised pt's son that our sleep lab will file with pt's insurance and call him to schedule pt for the sleep study when we hear back from the pt's insurance regarding coverage of this sleep study. Pt's son verbalized understanding of results and advised that he would discuss with the patient. Pt's son states that if for some reason he does not want to move forward with treating this he will make that known when the sleep lab calls to schedule. He advised he was going to encourage he move forward with treating. Pt's son had no questions at this time but was encouraged to call back if questions arise.

## 2019-11-06 ENCOUNTER — Telehealth: Payer: Self-pay

## 2019-11-06 NOTE — Telephone Encounter (Signed)
LVM for pt to call me back to schedule sleep study  

## 2019-12-03 ENCOUNTER — Ambulatory Visit (INDEPENDENT_AMBULATORY_CARE_PROVIDER_SITE_OTHER): Payer: Medicare Other | Admitting: Neurology

## 2019-12-03 DIAGNOSIS — G4733 Obstructive sleep apnea (adult) (pediatric): Secondary | ICD-10-CM | POA: Diagnosis not present

## 2019-12-03 DIAGNOSIS — R0683 Snoring: Secondary | ICD-10-CM

## 2019-12-03 DIAGNOSIS — G4719 Other hypersomnia: Secondary | ICD-10-CM

## 2019-12-03 DIAGNOSIS — M48062 Spinal stenosis, lumbar region with neurogenic claudication: Secondary | ICD-10-CM

## 2019-12-03 DIAGNOSIS — I447 Left bundle-branch block, unspecified: Secondary | ICD-10-CM

## 2019-12-03 DIAGNOSIS — N1831 Chronic kidney disease, stage 3a: Secondary | ICD-10-CM

## 2019-12-14 NOTE — Addendum Note (Signed)
Addended by: Larey Seat on: 12/14/2019 09:27 AM   Modules accepted: Orders

## 2019-12-14 NOTE — Progress Notes (Signed)
DIAGNOSIS  1. Obstructive Sleep Apnea responding well to CPAP at 9 cm water  pressure.  2. PLMs let to very few arousals but persistent through the night  in all NREM stages of sleep.  See attached screen shot    PLANS/RECOMMENDATIONS: Auto CPAP device with pressure settings of  6-12 cm water pressure 2 cm EPR and heated humidity, with a  ResMed F30 medium full face mask.  1. Any apnea patient should avoid sedatives, hypnotics, and  alcohol consumption.  2. The PLMs were right leg dominant and may be caused by known  spinal stenosis.

## 2019-12-14 NOTE — Procedures (Signed)
PATIENT'S NAME:  Robert Beltran, Robert Beltran DOB:      05-03-1924      MR#:    [id number]     DATE OF RECORDING: 12/03/2019 [facility] REFERRING M.D.:  Robert Infante, MD Study Performed:   CPAP  Titration Robert Beltran is returning for a CPAP Titration sleep study after having a Baseline PSG on 10/17/19.  1. Severe Obstructive Sleep Apnea (OSA) at AHI of 75.1/h. 2. Moderate Periodic Limb Movement Disorder (PLMD) 3. Nocturia related insomnia, sleep fragmentation.  4. Non-specific abnormal EKG   RECOMMENDATIONS:   Advise urgent return for a full-night, attended, PAP titration study to optimize therapy The patient endorsed the Epworth Sleepiness Scale at 16/24 points.   The patient's weight 165 pounds with a height of 67 (inches), resulting in a BMI of 26. kg/m2. The patient's neck circumference measured 16.5 inches.  CURRENT MEDICATIONS: Norvasc, Atacand, Hygroton, Vitamin D2, Lexapro, Synthroid, Glucophage, Multivitamins, Prilosec, Miralax, Altace, Crestor, Ultram    PROCEDURE:  This is a multichannel digital polysomnogram utilizing the Somnostar 11.2 system.  Electrodes and sensors were applied and monitored per AASM Specifications.   EEG, EOG, Chin and Limb EMG, were sampled at 200 Hz.  ECG, Snore and Nasal Pressure, Thermal Airflow, Respiratory Effort, CPAP Flow and Pressure, Oximetry was sampled at 50 Hz. Digital video and audio were recorded.      The patient was fitted with a ResMed F30 medium full face mask. CPAP was initiated at 5 cmH20 with heated humidity per AASM split night standards and pressure was advanced to 9 cmH20 CPAP because of hypopneas, apneas and desaturations.  At a PAP pressure of 9 cmH20, there was a reduction of the AHI to 0 with improvement of sleep apnea over 123 minutes of sleep/  Lights Out was at 20:50 and Lights On at 04:49. Total recording time (TRT) was 479.5 minutes, with a total sleep time (TST) of 342.5 minutes. The patient's sleep latency was 15.5 minutes. REM latency was 0  minutes.  The sleep efficiency was 71.4 %.    SLEEP ARCHITECTURE: WASO (Wake after sleep onset)  was 87 minutes.  There were 12 minutes in Stage N1, 330.5 minutes Stage N2, 0 minutes Stage N3 and 0 minutes in Stage REM.  The percentage of Stage N1 was 3.5%, Stage N2 was 96.5%, Stage N3 was 0% and Stage R (REM sleep) was 0%.   RESPIRATORY ANALYSIS:  There was a total of 24 respiratory events: 12 obstructive apneas, 3 central apneas and 0 mixed apneas with a total of 15 apneas and an apnea index (AHI) of 2.6 /hour. There were 9 hypopneas with a hypopnea index of 1.6/hour. The patient also had 0 respiratory event related arousals (RERAs).     The total APNEA/HYPOPNEA INDEX  (AHI) was 4.2 /hour and the total RESPIRATORY DISTURBANCE INDEX was 4.2 /hour  0 events occurred in REM sleep and 24 events in NREM. The REM AHI was 0 /hour versus a non-REM AHI of 4.2 /hour.  The patient spent all 342.5 minutes of total sleep time in the supine position and 0 minutes in non-supine.  OXYGEN SATURATION & C02:  The baseline 02 saturation was 93%, with the lowest being 88%. Time spent below 89% saturation equaled 0 minutes. The arousals were noted as: 16 were spontaneous, 71 were associated with PLMs, 16 were associated with respiratory events. The patient had a total of 855 Periodic Limb Movements. The Periodic Limb Movement (PLM) Arousal index was 2.4 /hour.  Audio and video analysis  did not show any abnormal or unusual movements, behaviors, phonations or vocalizations.  Nocturia times 3. Snoring was alleviated. EKG was in keeping with normal sinus rhythm.   DIAGNOSIS 1. Obstructive Sleep Apnea responding well to CPAP at 9 cm water pressure. 2. PLMs let to very few arousals but persistent through the night in all NREM stages of sleep. See attached screen shot    PLANS/RECOMMENDATIONS: Auto CPAP device with pressure settings of 6-12 cm water pressure 2 cm EPR and heated humidity, with a ResMed F30 medium full face  mask.  1. Any apnea patient should avoid sedatives, hypnotics, and alcohol consumption. 2.  The PLMs were right leg dominant and may be caused by known spinal stenosis.     DISCUSSION: A follow up appointment will be scheduled in the Sleep Clinic at [facility].   Please call [phone] with any questions.      I certify that I have reviewed the entire raw data recording prior to the issuance of this report in accordance with the Standards of Accreditation of the American Academy of Sleep Medicine (AASM)   Larey Seat, M.D. Diplomat, Tax adviser of Psychiatry and Neurology  Diplomat, Tax adviser of Sleep Medicine Market researcher, [employer] [facility] at [employer]

## 2019-12-18 ENCOUNTER — Telehealth: Payer: Self-pay | Admitting: Neurology

## 2019-12-18 NOTE — Telephone Encounter (Signed)
I called pt. I advised pt that Dr. Brett Fairy reviewed their sleep study results and found that pt was best treated with CPAP. Dr. Brett Fairy recommends that pt starts auto CPAP. I reviewed PAP compliance expectations with the pt. Pt is agreeable to starting a CPAP. I advised pt that an order will be sent to a DME, [facility], and [facility] will call the pt within about one week after they file with the pt's insurance. [facility] will show the pt how to use the machine, fit for masks, and troubleshoot the CPAP if needed. A follow up appt was made for insurance purposes with Ward Givens on 02/21/2020 at 10 am. Pt verbalized understanding to arrive 15 minutes early and bring their CPAP. A letter with all of this information in it will be mailed to the pt as a reminder. I verified with the pt that the address we have on file is correct. Pt verbalized understanding of results. Pt had no questions at this time but was encouraged to call back if questions arise. I have sent the order to [facility] and have received confirmation that they have received the order.

## 2019-12-18 NOTE — Telephone Encounter (Signed)
-----   Message from Larey Seat, MD sent at 12/14/2019  9:27 AM EDT ----- DIAGNOSIS  1. Obstructive Sleep Apnea responding well to CPAP at 9 cm water  pressure.  2. PLMs let to very few arousals but persistent through the night  in all NREM stages of sleep.  See attached screen shot    PLANS/RECOMMENDATIONS: Auto CPAP device with pressure settings of  6-12 cm water pressure 2 cm EPR and heated humidity, with a  ResMed F30 medium full face mask.  1. Any apnea patient should avoid sedatives, hypnotics, and  alcohol consumption.  2. The PLMs were right leg dominant and may be caused by known  spinal stenosis.

## 2020-02-20 ENCOUNTER — Encounter: Payer: Self-pay | Admitting: Neurology

## 2020-02-21 ENCOUNTER — Ambulatory Visit (INDEPENDENT_AMBULATORY_CARE_PROVIDER_SITE_OTHER): Payer: Medicare Other | Admitting: Adult Health

## 2020-02-21 ENCOUNTER — Encounter: Payer: Self-pay | Admitting: Adult Health

## 2020-02-21 VITALS — BP 140/69 | HR 71 | Ht 62.0 in | Wt 160.0 lb

## 2020-02-21 DIAGNOSIS — Z9989 Dependence on other enabling machines and devices: Secondary | ICD-10-CM

## 2020-02-21 DIAGNOSIS — G4733 Obstructive sleep apnea (adult) (pediatric): Secondary | ICD-10-CM

## 2020-02-21 NOTE — Progress Notes (Signed)
PATIENT: Robert Beltran DOB: Jun 30, 1923  REASON FOR VISIT: follow up HISTORY FROM: patient  HISTORY OF PRESENT ILLNESS: Today 02/21/20:  Robert Beltran is a 86 year old male with a history of obstructive sleep apnea on CPAP.  His download indicates that he use his machine 15 out of 30 days for compliance of 50%.  He uses machine greater than 4 hours 0 nights.  On average he uses his machine 59 minutes.  His residual AHI is 25.6 on 6 to 12 cm of water.  His leak in the 95th percentile is 19.6.  He reports that he recently got a new mask.  He feels like this is more comfortable for him.  He states that most nights he has a hard time keeping the machine on.  He returns today for an evaluation.  HISTORY 09/17/19:  Robert Beltran a 86 year old Caucasian male patient, and seen on 09/17/2019 for an evaluation of daytime somnolence.  Chiefconcernaccording to patient :   86 year-old gentleman, here with his son, who notices that he has episodes where he stops breathing in sleep and snores. pt states he can't sleep. The patient is nodding of all the time, sleeping much more in daytime, he is also a loud snorer. He urinates 6-8 times at night (!)     Robert Beltran is a right -handed White or Caucasian male with  a  medical history of Arthritis, Benign prostate hyperplasia, testicular Cancer in 1962 [facility]), Chronic back pain, Depression, Diabetes mellitus without complication ([place]), DJD (degenerative joint disease), GERD (gastroesophageal reflux disease), HLD (hyperlipidemia), and Hypertension. He had severe lumbar spinal stenosis, had laminectomy in 2014 with Sherley Bounds, MD. Before he was falling, and weak.   Sleeprelevant medical history: Nocturia/ snoring, sleep is much fragmented and he is extremely daytime fatigued and sleepy.  Familymedical /sleep history:Son on CPAP with OSA.  Social history:Patient is retired since 1984 from [employer],  and lives in a  household alone in independent living - at [employer].  Family status is widowed with 1 adult son.The pa Pets are not present. Tobacco use; quit in 1981, pipe smoking.  ETOH use- not now , Caffeine intake in form of Coffee( 2-3 in AM ) Soda( none ) Tea ( rare ) , and no energy drinks.Regular exercise in form of walking with the walker.    Sleep habits are as follows:The patient's dinner time is between 5 PM. The patient goes to bed at 9.30-11 PM and continues to sleep for 1 hour, wakes for many, many bathroom breaks, the first time at 1 hour into sleep.   The preferred sleep position is on his right side, but also on supine with the support of 1 pillow. He likes the TV on all night. Dreams are reportedly infrequent/vivid. No falls out of bed due to dreams.  He also endorsed leg pain at night- only left- ever since surgery- laminectomy in 2014. He wakes up and gets a shot of whiskey.  6. 15  AM is the usual rise time.  The patient wakes up spontaneously/at 6 Am and goes to breakfast. After breakfast he takes a nap and this lasts about 2-3 hours(!). He skips lunch- naps again after dinner.  He reports not feeling refreshed or restored in AM for a long time , with symptoms such as dry mouth, but not headaches, and always residual fatigue.  Naps are taken frequently, several times a day, usually lasting hours.  REVIEW OF SYSTEMS: Out of a complete 14 system review of symptoms, the patient complains only of the following symptoms, and all other reviewed systems are negative.  FSS 26 ESS 16  ALLERGIES: Allergies  Allergen Reactions  . Aspirin Other (See Comments)    GI bleed  . Nsaids Other (See Comments)    GI distress  . Other Nausea Only    Coricidin  . Bactrim [Sulfamethoxazole-Trimethoprim] Rash    HOME MEDICATIONS: Outpatient Medications Prior to Visit  Medication Sig Dispense Refill  . amLODipine (NORVASC) 5 MG tablet Take 5 mg by mouth daily.    . candesartan (ATACAND) 8  MG tablet Take 8 mg by mouth in the morning and at bedtime.    . chlorthalidone (HYGROTON) 25 MG tablet Take 25 mg by mouth daily.    . ergocalciferol (VITAMIN D2) 50000 UNITS capsule Take 50,000 Units by mouth once a week.    . escitalopram (LEXAPRO) 5 MG tablet Take 5 mg by mouth daily.    Marland Kitchen levothyroxine (SYNTHROID) 75 MCG tablet Take 75 mcg by mouth daily before breakfast.    . metFORMIN (GLUCOPHAGE) 500 MG tablet Take 500 mg by mouth 2 (two) times daily with a meal.    . Multiple Vitamin (MULTIVITAMIN WITH MINERALS) TABS Take 1 tablet by mouth daily.    . Omeprazole Magnesium (PRILOSEC OTC PO) Take 20 mg by mouth daily.    . polyethylene glycol (MIRALAX / GLYCOLAX) 17 g packet Take 17 g by mouth daily.    . ramipril (ALTACE) 10 MG capsule Take 10 mg by mouth daily.    Marland Kitchen rOPINIRole (REQUIP) 0.25 MG tablet Take 0.25-1 mg by mouth daily as needed. For restless leg    . rosuvastatin (CRESTOR) 20 MG tablet Take 20 mg by mouth daily.    . traMADol (ULTRAM) 50 MG tablet Take 50 mg by mouth every 6 (six) hours as needed for pain.     No facility-administered medications prior to visit.    PAST MEDICAL HISTORY: Past Medical History:  Diagnosis Date  . Arthritis   . Benign prostate hyperplasia   . Cancer [facility])    testicular cancer 1964  . Chronic back pain   . Depression   . Diabetes mellitus without complication ([place])   . DJD (degenerative joint disease)   . GERD (gastroesophageal reflux disease)   . HLD (hyperlipidemia)   . Hypertension    sees Dr. Crist Infante,     PAST SURGICAL HISTORY: Past Surgical History:  Procedure Laterality Date  . APPENDECTOMY     1995  . EYE SURGERY     bilateral surgery  . LUMBAR LAMINECTOMY/DECOMPRESSION MICRODISCECTOMY N/A 08/29/2012   Procedure: LUMBAR LAMINECTOMY/DECOMPRESSION MICRODISCECTOMY 2 LEVELS;  Surgeon: Eustace Moore, MD;  Location: [facility];  Service: Neurosurgery;  Laterality: N/A;  . SHOULDER SURGERY     right    FAMILY  HISTORY: Family History  Problem Relation Age of Onset  . Heart attack Mother   . Colon cancer Brother     SOCIAL HISTORY: Social History   Socioeconomic History  . Marital status: Widowed    Spouse name: Not on file  . Number of children: Not on file  . Years of education: Not on file  . Highest education level: Not on file  Occupational History  . Not on file  Tobacco Use  . Smoking status: Former Research scientist (life sciences)  . Smokeless tobacco: Never Used  . Tobacco comment: 1981  Substance and Sexual Activity  .  Alcohol use: Yes    Comment: "occas"  . Drug use: No  . Sexual activity: Not on file  Other Topics Concern  . Not on file  Social History Narrative  . Not on file   Social Determinants of Health   Financial Resource Strain:   . Difficulty of Paying Living Expenses: Not on file  Food Insecurity:   . Worried About Charity fundraiser in the Last Year: Not on file  . Ran Out of Food in the Last Year: Not on file  Transportation Needs:   . Lack of Transportation (Medical): Not on file  . Lack of Transportation (Non-Medical): Not on file  Physical Activity:   . Days of Exercise per Week: Not on file  . Minutes of Exercise per Session: Not on file  Stress:   . Feeling of Stress : Not on file  Social Connections:   . Frequency of Communication with Friends and Family: Not on file  . Frequency of Social Gatherings with Friends and Family: Not on file  . Attends Religious Services: Not on file  . Active Member of Clubs or Organizations: Not on file  . Attends Archivist Meetings: Not on file  . Marital Status: Not on file  Intimate Partner Violence:   . Fear of Current or Ex-Partner: Not on file  . Emotionally Abused: Not on file  . Physically Abused: Not on file  . Sexually Abused: Not on file      PHYSICAL EXAM  Vitals:   02/21/20 0958  BP: 140/69  Pulse: 71  Weight: 160 lb (72.6 kg)  Height: 5\' 2"  (1.575 m)   Body mass index is 29.26  kg/m.  Generalized: Well developed, in no acute distress  Chest: Lungs clear to auscultation bilaterally  Neurological examination  Mentation: Alert oriented to time, place, history taking. Follows all commands speech and language fluent Cranial nerve II-XII: Extraocular movements were full, visual field were full on confrontational test Head turning and shoulder shrug  were normal and symmetric. Motor: The motor testing reveals 5 over 5 strength of all 4 extremities. Good symmetric motor tone is noted throughout.  Sensory: Sensory testing is intact to soft touch on all 4 extremities. No evidence of extinction is noted.  Gait and station: Gait is normal.    DIAGNOSTIC DATA (LABS, IMAGING, TESTING) - I reviewed patient records, labs, notes, testing and imaging myself where available.  Lab Results  Component Value Date   WBC 9.1 08/29/2012   HGB 16.7 08/29/2012   HCT 49.0 08/29/2012   MCV 94.3 08/29/2012   PLT 168 08/29/2012      Component Value Date/Time   NA 139 08/29/2012 0745   K 3.7 08/29/2012 0745   CL 101 08/29/2012 0623   CO2 25 08/29/2012 0623   GLUCOSE 118 (H) 08/29/2012 0745   BUN 29 (H) 08/29/2012 0623   CREATININE 1.19 08/29/2012 0623   CALCIUM 9.1 08/29/2012 0623   GFRNONAA 53 (L) 08/29/2012 0623   GFRAA 62 (L) 08/29/2012 8250      ASSESSMENT AND PLAN 86 y.o. year old male  has a past medical history of Arthritis, Benign prostate hyperplasia, Cancer ([place]), Chronic back pain, Depression, Diabetes mellitus without complication ([place]), DJD (degenerative joint disease), GERD (gastroesophageal reflux disease), HLD (hyperlipidemia), and Hypertension. here with:  1. OSA on CPAP  - CPAP indicates noncompliance -Patient recently got a new mask. -Encouraged the patient to try to wear the machine 30 minutes during the day to  help him get acclimated. - Encourage patient to use CPAP nightly and > 4 hours each night - F/U in 6 months  or sooner if needed   I spent 30  minutes of face-to-face and non-face-to-face time with patient.  This included previsit chart review, lab review, study review, order entry, electronic health record documentation, patient education.  Ward Givens, MSN, NP-C 02/21/2020, 10:14 AM [facility] [address] [place], [place] [address] [phone]

## 2020-02-21 NOTE — Patient Instructions (Signed)
Continue using CPAP nightly and greater than 4 hours each night °If your symptoms worsen or you develop new symptoms please let us know.  ° °

## 2020-08-28 ENCOUNTER — Ambulatory Visit: Payer: Medicare Other | Admitting: Adult Health

## 2021-05-26 ENCOUNTER — Telehealth (HOSPITAL_COMMUNITY): Payer: Self-pay

## 2021-05-26 NOTE — Telephone Encounter (Signed)
Attempted to contact son of patient to schedule OP MBS - left voicemail.

## 2021-05-28 ENCOUNTER — Other Ambulatory Visit (HOSPITAL_COMMUNITY): Payer: Self-pay

## 2021-05-28 DIAGNOSIS — R1310 Dysphagia, unspecified: Secondary | ICD-10-CM

## 2021-06-04 ENCOUNTER — Ambulatory Visit (HOSPITAL_COMMUNITY)
Admission: RE | Admit: 2021-06-04 | Discharge: 2021-06-04 | Disposition: A | Discharge status: Home / Self Care | Payer: Medicare Other | Source: Ambulatory Visit | Attending: Internal Medicine | Admitting: Internal Medicine

## 2021-06-04 ENCOUNTER — Other Ambulatory Visit: Payer: Self-pay

## 2021-06-04 DIAGNOSIS — R1310 Dysphagia, unspecified: Secondary | ICD-10-CM | POA: Insufficient documentation

## 2021-06-04 DIAGNOSIS — R1313 Dysphagia, pharyngeal phase: Secondary | ICD-10-CM

## 2021-06-04 NOTE — Therapy (Signed)
Modified Barium Swallow Progress Note  Patient Details  Name: Robert Beltran MRN: [id number] Date of Birth: 04-Sep-1923  Today's Date: 06/04/2021  Modified Barium Swallow completed.  Full report located under Chart Review in the Imaging Section.  Brief recommendations include the following:  Clinical Impression Pt presents with functional oral and pharyngeal swallow function. Trigger of swallow reflex was noted at the vallecular sinus across consistencies.   Trace penetration was noted during the swallow of thin liquids via straw only. No penetration or aspiration of cup drinking of thin liquids, puree, or solid textures was seen. No post-swallow residue of any consistency.   Esophageal sweep revealed stasis in the mid-esophageal area.   Recommend continuing with current diet, regular solids and thin liquids. Given penetration with thin liquids via straw, pt was encouraged to continue to avoid straw use. Meds recommended per pt preference. Given observation on esophageal sweep, pt and son were provided with written behavioral and dietary strategies for management of esophageal dysmotility. This information was also reviewed with both of them. They were provided with the opportunity to ask questions.   Also recommend referral for a regular barium swallow (esophagram) to more fully assess esophageal motility issues. No further ST intervention is recommended at this time. Please reconsult if needs arise. Thank you for this referral!     Swallow Evaluation Recommendations   Recommended Consults: Consider esophageal assessment   SLP Diet Recommendations: Regular solids;Thin liquid   Liquid Administration via: Cup;No straw   Medication Administration: Whole meds with liquid   Supervision: Patient able to self feed   Compensations: Slow rate;Small sips/bites;Follow solids with liquid   Postural Changes: Seated upright at 90 degrees;Remain semi-upright after after feeds/meals  (Comment)   Oral Care Recommendations: Oral care BID  Chung Chagoya B. Quentin Ore, [facility], [place] Speech Language Pathologist Office: [phone]  Shonna Chock 06/04/2021,1:54 PM

## 2021-11-12 ENCOUNTER — Inpatient Hospital Stay (HOSPITAL_COMMUNITY)
Admission: EM | Admit: 2021-11-12 | Discharge: 2021-11-16 | DRG: 871 | Disposition: A | Discharge status: Skilled Nursing Facility | Payer: Medicare Other | Source: Skilled Nursing Facility | Attending: Internal Medicine | Admitting: Internal Medicine

## 2021-11-12 ENCOUNTER — Emergency Department (HOSPITAL_COMMUNITY): Payer: Medicare Other

## 2021-11-12 DIAGNOSIS — W010XXA Fall on same level from slipping, tripping and stumbling without subsequent striking against object, initial encounter: Secondary | ICD-10-CM | POA: Diagnosis present

## 2021-11-12 DIAGNOSIS — Z800 Family history of malignant neoplasm of digestive organs: Secondary | ICD-10-CM | POA: Diagnosis not present

## 2021-11-12 DIAGNOSIS — Y92129 Unspecified place in nursing home as the place of occurrence of the external cause: Secondary | ICD-10-CM

## 2021-11-12 DIAGNOSIS — A419 Sepsis, unspecified organism: Principal | ICD-10-CM | POA: Diagnosis present

## 2021-11-12 DIAGNOSIS — N400 Enlarged prostate without lower urinary tract symptoms: Secondary | ICD-10-CM | POA: Diagnosis present

## 2021-11-12 DIAGNOSIS — R6520 Severe sepsis without septic shock: Secondary | ICD-10-CM | POA: Diagnosis present

## 2021-11-12 DIAGNOSIS — I5032 Chronic diastolic (congestive) heart failure: Secondary | ICD-10-CM | POA: Diagnosis present

## 2021-11-12 DIAGNOSIS — E119 Type 2 diabetes mellitus without complications: Secondary | ICD-10-CM | POA: Diagnosis present

## 2021-11-12 DIAGNOSIS — Z79890 Hormone replacement therapy (postmenopausal): Secondary | ICD-10-CM | POA: Diagnosis not present

## 2021-11-12 DIAGNOSIS — M549 Dorsalgia, unspecified: Secondary | ICD-10-CM | POA: Diagnosis present

## 2021-11-12 DIAGNOSIS — E1122 Type 2 diabetes mellitus with diabetic chronic kidney disease: Secondary | ICD-10-CM | POA: Diagnosis present

## 2021-11-12 DIAGNOSIS — Z7984 Long term (current) use of oral hypoglycemic drugs: Secondary | ICD-10-CM

## 2021-11-12 DIAGNOSIS — I2693 Single subsegmental pulmonary embolism without acute cor pulmonale: Secondary | ICD-10-CM | POA: Diagnosis present

## 2021-11-12 DIAGNOSIS — E785 Hyperlipidemia, unspecified: Secondary | ICD-10-CM | POA: Diagnosis present

## 2021-11-12 DIAGNOSIS — I2699 Other pulmonary embolism without acute cor pulmonale: Secondary | ICD-10-CM

## 2021-11-12 DIAGNOSIS — I130 Hypertensive heart and chronic kidney disease with heart failure and stage 1 through stage 4 chronic kidney disease, or unspecified chronic kidney disease: Secondary | ICD-10-CM | POA: Diagnosis present

## 2021-11-12 DIAGNOSIS — Z8547 Personal history of malignant neoplasm of testis: Secondary | ICD-10-CM | POA: Diagnosis not present

## 2021-11-12 DIAGNOSIS — H9190 Unspecified hearing loss, unspecified ear: Secondary | ICD-10-CM | POA: Diagnosis present

## 2021-11-12 DIAGNOSIS — J690 Pneumonitis due to inhalation of food and vomit: Secondary | ICD-10-CM | POA: Diagnosis present

## 2021-11-12 DIAGNOSIS — Z20822 Contact with and (suspected) exposure to covid-19: Secondary | ICD-10-CM | POA: Diagnosis present

## 2021-11-12 DIAGNOSIS — I2609 Other pulmonary embolism with acute cor pulmonale: Secondary | ICD-10-CM | POA: Diagnosis not present

## 2021-11-12 DIAGNOSIS — R54 Age-related physical debility: Secondary | ICD-10-CM | POA: Diagnosis present

## 2021-11-12 DIAGNOSIS — K219 Gastro-esophageal reflux disease without esophagitis: Secondary | ICD-10-CM | POA: Diagnosis present

## 2021-11-12 DIAGNOSIS — W19XXXA Unspecified fall, initial encounter: Secondary | ICD-10-CM

## 2021-11-12 DIAGNOSIS — Z87891 Personal history of nicotine dependence: Secondary | ICD-10-CM | POA: Diagnosis not present

## 2021-11-12 DIAGNOSIS — Z79899 Other long term (current) drug therapy: Secondary | ICD-10-CM

## 2021-11-12 DIAGNOSIS — Y92009 Unspecified place in unspecified non-institutional (private) residence as the place of occurrence of the external cause: Secondary | ICD-10-CM

## 2021-11-12 DIAGNOSIS — J189 Pneumonia, unspecified organism: Secondary | ICD-10-CM | POA: Diagnosis present

## 2021-11-12 DIAGNOSIS — N1831 Chronic kidney disease, stage 3a: Secondary | ICD-10-CM | POA: Diagnosis present

## 2021-11-12 DIAGNOSIS — J9601 Acute respiratory failure with hypoxia: Secondary | ICD-10-CM | POA: Diagnosis present

## 2021-11-12 DIAGNOSIS — Z882 Allergy status to sulfonamides status: Secondary | ICD-10-CM

## 2021-11-12 DIAGNOSIS — M1990 Unspecified osteoarthritis, unspecified site: Secondary | ICD-10-CM | POA: Diagnosis present

## 2021-11-12 DIAGNOSIS — Z8249 Family history of ischemic heart disease and other diseases of the circulatory system: Secondary | ICD-10-CM

## 2021-11-12 DIAGNOSIS — G8929 Other chronic pain: Secondary | ICD-10-CM | POA: Diagnosis present

## 2021-11-12 DIAGNOSIS — Z886 Allergy status to analgesic agent status: Secondary | ICD-10-CM

## 2021-11-12 DIAGNOSIS — I10 Essential (primary) hypertension: Secondary | ICD-10-CM

## 2021-11-12 DIAGNOSIS — S0990XA Unspecified injury of head, initial encounter: Secondary | ICD-10-CM

## 2021-11-12 DIAGNOSIS — M7989 Other specified soft tissue disorders: Secondary | ICD-10-CM | POA: Diagnosis not present

## 2021-11-12 LAB — BRAIN NATRIURETIC PEPTIDE: B Natriuretic Peptide: 205.6 pg/mL — ABNORMAL HIGH (ref 0.0–100.0)

## 2021-11-12 LAB — BASIC METABOLIC PANEL
Anion gap: 11 (ref 5–15)
BUN: 17 mg/dL (ref 8–23)
CO2: 22 mmol/L (ref 22–32)
Calcium: 8.6 mg/dL — ABNORMAL LOW (ref 8.9–10.3)
Chloride: 103 mmol/L (ref 98–111)
Creatinine, Ser: 1.04 mg/dL (ref 0.61–1.24)
GFR, Estimated: >60 mL/min (ref >60)
Glucose, Bld: 239 mg/dL — ABNORMAL HIGH (ref 70–99)
Potassium: 5.1 mmol/L (ref 3.5–5.1)
Sodium: 136 mmol/L (ref 135–145)

## 2021-11-12 LAB — RESP PANEL BY RT-PCR (FLU A&B, COVID) ARPGX2
Influenza A by PCR: NEGATIVE
Influenza B by PCR: NEGATIVE
SARS Coronavirus 2 by RT PCR: NEGATIVE

## 2021-11-12 LAB — CBC WITH DIFFERENTIAL/PLATELET
Abs Immature Granulocytes: 0.09 10*3/uL — ABNORMAL HIGH (ref 0.00–0.07)
Basophils Absolute: 0 10*3/uL (ref 0.0–0.1)
Basophils Relative: 0 %
Eosinophils Absolute: 0 10*3/uL (ref 0.0–0.5)
Eosinophils Relative: 0 %
HCT: 43.5 % (ref 39.0–52.0)
Hemoglobin: 14.3 g/dL (ref 13.0–17.0)
Immature Granulocytes: 1 %
Lymphocytes Relative: 2 %
Lymphs Abs: 0.3 10*3/uL — ABNORMAL LOW (ref 0.7–4.0)
MCH: 34 pg (ref 26.0–34.0)
MCHC: 32.9 g/dL (ref 30.0–36.0)
MCV: 103.6 fL — ABNORMAL HIGH (ref 80.0–100.0)
Monocytes Absolute: 0.6 10*3/uL (ref 0.1–1.0)
Monocytes Relative: 3 %
Neutro Abs: 16 10*3/uL — ABNORMAL HIGH (ref 1.7–7.7)
Neutrophils Relative %: 94 %
Platelets: 144 10*3/uL — ABNORMAL LOW (ref 150–400)
RBC: 4.2 MIL/uL — ABNORMAL LOW (ref 4.22–5.81)
RDW: 15.1 % (ref 11.5–15.5)
WBC: 17 10*3/uL — ABNORMAL HIGH (ref 4.0–10.5)
nRBC: 0 % (ref 0.0–0.2)

## 2021-11-12 LAB — D-DIMER, QUANTITATIVE: D-Dimer, Quant: 7.66 ug/mL-FEU — ABNORMAL HIGH (ref 0.00–0.50)

## 2021-11-12 LAB — TROPONIN I (HIGH SENSITIVITY)
Troponin I (High Sensitivity): 18 ng/L — ABNORMAL HIGH (ref <18)
Troponin I (High Sensitivity): 23 ng/L — ABNORMAL HIGH (ref <18)

## 2021-11-12 MED ORDER — ACETAMINOPHEN 650 MG RE SUPP
650.0000 mg | Freq: Four times a day (QID) | RECTAL | Status: DC | PRN
Start: 2021-11-12 — End: 2021-11-16

## 2021-11-12 MED ORDER — IOHEXOL 350 MG/ML SOLN
75.0000 mL | Freq: Once | INTRAVENOUS | Status: AC | PRN
  Administered 2021-11-12: 75 mL via INTRAVENOUS

## 2021-11-12 MED ORDER — PIPERACILLIN-TAZOBACTAM 3.375 G IVPB 30 MIN
3.3750 g | Freq: Once | INTRAVENOUS | Status: AC
  Administered 2021-11-13: 3.375 g via INTRAVENOUS
  Filled 2021-11-12: qty 50

## 2021-11-12 MED ORDER — ACETAMINOPHEN 325 MG PO TABS
650.0000 mg | ORAL_TABLET | Freq: Four times a day (QID) | ORAL | Status: DC | PRN
  Administered 2021-11-16: 650 mg via ORAL
  Filled 2021-11-12: qty 2

## 2021-11-12 NOTE — ED Notes (Signed)
CT called to check on status. Will transport shortly.

## 2021-11-12 NOTE — ED Notes (Signed)
[facility] call for DC/ update - staff worker.

## 2021-11-12 NOTE — ED Notes (Signed)
Pt drops to 88% on room air. 6L [place] does not raise saturation. Placed on NRB mask. RT called for eval. Vomit also seen behind pt's head and in beard. Pt reports he has vomited at some point in CT. This RN was not made aware of this. Facility member is now at bedside. Remains on NRB. Some increased work of breathing, but no obvious distress. No complaints from pt.

## 2021-11-12 NOTE — ED Provider Notes (Signed)
[facility] EMERGENCY DEPARTMENT Provider Note   CSN: [id number] Arrival date & time: 11/12/21  1844     History  Chief Complaint  Patient presents with   Lytle Michaels    Robert Beltran is a 88 y.o. male.  Pt is a 88 yo male from [facility].  He has a pmhx significant for htn, dm, bph, gerd, testicular cancer, hld, chronic back pain, and chronic right leg pain.  Pt had a mechanical fall about 1 hr pta.  He did hit his head.  He had no loc.  Pt denies any pain.        Home Medications Prior to Admission medications   Medication Sig Start Date End Date Taking? Authorizing Provider  amLODipine (NORVASC) 5 MG tablet Take 5 mg by mouth daily.    [provider]  candesartan (ATACAND) 8 MG tablet Take 8 mg by mouth in the morning and at bedtime.    [provider]  chlorthalidone (HYGROTON) 25 MG tablet Take 25 mg by mouth daily.    [provider]  ergocalciferol (VITAMIN D2) 50000 UNITS capsule Take 50,000 Units by mouth once a week.    [provider]  escitalopram (LEXAPRO) 5 MG tablet Take 5 mg by mouth daily.    [provider]  levothyroxine (SYNTHROID) 75 MCG tablet Take 75 mcg by mouth daily before breakfast.    [provider]  metFORMIN (GLUCOPHAGE) 500 MG tablet Take 500 mg by mouth 2 (two) times daily with a meal.    [provider]  Multiple Vitamin (MULTIVITAMIN WITH MINERALS) TABS Take 1 tablet by mouth daily.    [provider]  Omeprazole Magnesium (PRILOSEC OTC PO) Take 20 mg by mouth daily.    [provider]  polyethylene glycol (MIRALAX / GLYCOLAX) 17 g packet Take 17 g by mouth daily.    [provider]  ramipril (ALTACE) 10 MG capsule Take 10 mg by mouth daily.    [provider]  rOPINIRole (REQUIP) 0.25 MG tablet Take 0.25-1 mg by mouth daily as needed. For restless leg    [provider]  rosuvastatin (CRESTOR) 20 MG tablet Take 20 mg by mouth daily.     [provider]  traMADol (ULTRAM) 50 MG tablet Take 50 mg by mouth every 6 (six) hours as needed for pain.    [provider]      Allergies    Aspirin, Nsaids, Other, and Bactrim [sulfamethoxazole-trimethoprim]    Review of Systems   Review of Systems  All other systems reviewed and are negative.  Physical Exam Updated Vital Signs BP (!) 156/112   Pulse 94   Temp 98.8 F (37.1 C) (Rectal)   Resp (!) 21   SpO2 95%  Physical Exam Vitals and nursing note reviewed.  Constitutional:      Appearance: Normal appearance.  HENT:     Head: Normocephalic.      Right Ear: External ear normal.     Left Ear: External ear normal.     Nose: Nose normal.     Mouth/Throat:     Mouth: Mucous membranes are moist.     Pharynx: Oropharynx is clear.  Eyes:     Extraocular Movements: Extraocular movements intact.     Conjunctiva/sclera: Conjunctivae normal.     Pupils: Pupils are equal, round, and reactive to light.  Cardiovascular:     Rate and Rhythm: Normal rate and regular rhythm.     Pulses: Normal  pulses.     Heart sounds: Normal heart sounds.  Pulmonary:     Effort: Pulmonary effort is normal.     Breath sounds: Normal breath sounds.  Abdominal:     General: Abdomen is flat. Bowel sounds are normal.     Palpations: Abdomen is soft.  Musculoskeletal:        General: Normal range of motion.     Cervical back: Normal range of motion and neck supple.       Legs:     Comments: Right leg is slightly more swollen than the left.  Skin:    General: Skin is warm.     Capillary Refill: Capillary refill takes less than 2 seconds.  Neurological:     General: No focal deficit present.     Mental Status: He is alert and oriented to person, place, and time.  Psychiatric:        Mood and Affect: Mood normal.        Behavior: Behavior normal.    ED Results / Procedures / Treatments   Labs (all labs ordered are listed, but only abnormal results are displayed) Labs  Reviewed  BRAIN NATRIURETIC PEPTIDE - Abnormal; Notable for the following components:      Result Value   B Natriuretic Peptide 205.6 (*)    All other components within normal limits  D-DIMER, QUANTITATIVE - Abnormal; Notable for the following components:   D-Dimer, Quant 7.66 (*)    All other components within normal limits  BASIC METABOLIC PANEL - Abnormal; Notable for the following components:   Glucose, Bld 239 (*)    Calcium 8.6 (*)    All other components within normal limits  CBC WITH DIFFERENTIAL/PLATELET - Abnormal; Notable for the following components:   WBC 17.0 (*)    RBC 4.20 (*)    MCV 103.6 (*)    Platelets 144 (*)    Neutro Abs 16.0 (*)    Lymphs Abs 0.3 (*)    Abs Immature Granulocytes 0.09 (*)    All other components within normal limits  TROPONIN I (HIGH SENSITIVITY) - Abnormal; Notable for the following components:   Troponin I (High Sensitivity) 18 (*)    All other components within normal limits  TROPONIN I (HIGH SENSITIVITY) - Abnormal; Notable for the following components:   Troponin I (High Sensitivity) 23 (*)    All other components within normal limits  RESP PANEL BY RT-PCR (FLU A&B, COVID) ARPGX2  CULTURE, BLOOD (ROUTINE X 2)  CULTURE, BLOOD (ROUTINE X 2)  CBC WITH DIFFERENTIAL/PLATELET  URINALYSIS, ROUTINE W REFLEX MICROSCOPIC  LACTIC ACID, PLASMA  LACTIC ACID, PLASMA    EKG EKG Interpretation  Date/Time:  Thursday Nov 12 2021 18:57:15 EDT Ventricular Rate:  82 PR Interval:  36 QRS Duration: 130 QT Interval:  434 QTC Calculation: 507 R Axis:   -6 Text Interpretation: Ectopic atrial rhythm Short PR interval Left bundle branch block No significant change since last tracing Confirmed by Isla Pence [phone]) on 11/12/2021 8:07:47 PM  Radiology DG Chest 2 View  Result Date: 11/12/2021 CLINICAL DATA:  Fall EXAM: CHEST - 2 VIEW COMPARISON:  08/29/2012 FINDINGS: Shallow lung inflation with bibasilar atelectasis. Mild cardiomegaly. Normal  pleural spaces. IMPRESSION: Shallow lung inflation with bibasilar atelectasis. Electronically Signed   By: Ulyses Jarred M.D.   On: 11/12/2021 19:44   DG Pelvis 1-2 Views  Result Date: 11/12/2021 CLINICAL DATA:  Fall EXAM: PELVIS - 1-2 VIEW COMPARISON:  None Available. FINDINGS: There is no  evidence of pelvic fracture or diastasis. No pelvic bone lesions are seen. Dense stool in the colon. IMPRESSION: Negative. Electronically Signed   By: Ulyses Jarred M.D.   On: 11/12/2021 19:52   CT Head Wo Contrast  Result Date: 11/12/2021 CLINICAL DATA:  [place], hit head EXAM: CT HEAD WITHOUT CONTRAST TECHNIQUE: Contiguous axial images were obtained from the base of the skull through the vertex without intravenous contrast. RADIATION DOSE REDUCTION: This exam was performed according to the departmental dose-optimization program which includes automated exposure control, adjustment of the mA and/or kV according to patient size and/or use of iterative reconstruction technique. COMPARISON:  10/30/2015 FINDINGS: Brain: Chronic small vessel ischemic changes are seen throughout the periventricular white matter and bilateral basal ganglia, progressive since prior exam. No evidence of acute infarct or hemorrhage. Lateral ventricles and midline structures are unremarkable. No acute extra-axial fluid collections. No mass effect. Vascular: Diffuse atherosclerosis of the internal carotid arteries. No hyperdense vessel. Skull: Normal. Negative for fracture or focal lesion. Sinuses/Orbits: No acute finding. Other: None. IMPRESSION: 1. No acute intracranial process. 2. Chronic small-vessel ischemic changes throughout the periventricular white matter and bilateral basal ganglia. Electronically Signed   By: Randa Ngo M.D.   On: 11/12/2021 20:01   CT Angio Chest PE W and/or Wo Contrast  Result Date: 11/12/2021 CLINICAL DATA:  Fall, evaluate for PE EXAM: CT ANGIOGRAPHY CHEST WITH CONTRAST TECHNIQUE: Multidetector CT imaging of the  chest was performed using the standard protocol during bolus administration of intravenous contrast. Multiplanar CT image reconstructions and MIPs were obtained to evaluate the vascular anatomy. RADIATION DOSE REDUCTION: This exam was performed according to the departmental dose-optimization program which includes automated exposure control, adjustment of the mA and/or kV according to patient size and/or use of iterative reconstruction technique. CONTRAST:  33m OMNIPAQUE IOHEXOL 350 MG/ML SOLN COMPARISON:  Chest radiograph dated 11/12/2021 FINDINGS: Cardiovascular: Satisfactory opacification of the bilateral pulmonary arteries to the segmental level. Evaluation is mildly constrained by respiratory motion. Within that constraint, there is an isolated subsegmental right upper lobe pulmonary embolism (series 7/image 124). Overall clot burden is very small. Study is not tailored for evaluation of the thoracic aorta. Atherosclerotic calcifications of the arch. Heart is normal in size.  No pericardial effusion. Three vessel coronary atherosclerosis. Mediastinum/Nodes: Small mediastinal lymph nodes, likely reactive. Visualized thyroid is unremarkable. Lungs/Pleura: Multifocal patchy opacities in the lungs bilaterally, lower lobe predominant, suspicious for pneumonia. Superimposed bilateral lower lobe atelectasis is possible. Evaluation lung parenchyma is constrained by respiratory motion. Within that constraint, there are no suspicious pulmonary nodules. No pleural effusion or pneumothorax. Upper Abdomen: Visualized upper abdomen is grossly unremarkable, noting vascular calcifications. Musculoskeletal: Degenerative changes at T12-L1. No rib fracture is seen. Degenerative changes of the bilateral shoulders, right greater than left. Review of the MIP images confirms the above findings. IMPRESSION: Isolated subsegmental right upper lobe pulmonary embolism. Overall clot burden is very small. Multifocal pneumonia, lower lobe  predominant. Critical Value/emergent results were called by telephone at the time of interpretation on 11/12/2021 at 11:43 pm to provider Royce Sciara , who verbally acknowledged these results. Aortic Atherosclerosis (ICD10-I70.0). Electronically Signed   By: SJulian HyM.D.   On: 11/12/2021 23:46   CT Cervical Spine Wo Contrast  Result Date: 11/12/2021 CLINICAL DATA:  [place] hit head EXAM: CT CERVICAL SPINE WITHOUT CONTRAST TECHNIQUE: Multidetector CT imaging of the cervical spine was performed without intravenous contrast. Multiplanar CT image reconstructions were also generated. RADIATION DOSE REDUCTION: This exam was performed according to  the departmental dose-optimization program which includes automated exposure control, adjustment of the mA and/or kV according to patient size and/or use of iterative reconstruction technique. COMPARISON:  None Available. FINDINGS: Alignment: Alignment is grossly anatomic. Skull base and vertebrae: No acute fracture. No primary bone lesion or focal pathologic process. Soft tissues and spinal canal: No prevertebral fluid or swelling. No visible canal hematoma. Disc levels: There is severe multilevel spondylosis and facet hypertrophy. There is bony fusion across the C3-4 disc space. Severe disc space narrowing and osteophyte formation are seen at C4-5, C5-6, and C6-7. Diffuse facet hypertrophic changes greatest at C2-3 and C3-4. Upper chest: Airway is patent. Lung apices are clear. Incidental note is made of fluid-filled upper thoracic esophagus which could reflect reflux. Other: Reconstructed images demonstrate no additional findings. IMPRESSION: 1. No acute cervical spine fracture. 2. Severe multilevel spondylosis and facet hypertrophy as above. Electronically Signed   By: Randa Ngo M.D.   On: 11/12/2021 20:04   DG Knee Complete 4 Views Left  Result Date: 11/12/2021 CLINICAL DATA:  Fall EXAM: LEFT KNEE - COMPLETE 4+ VIEW COMPARISON:  None Available.  FINDINGS: No evidence of fracture, dislocation, or joint effusion. No evidence of arthropathy or other focal bone abnormality. Soft tissues are unremarkable. IMPRESSION: Negative. Electronically Signed   By: Ulyses Jarred M.D.   On: 11/12/2021 19:49   DG Knee Complete 4 Views Right  Result Date: 11/12/2021 CLINICAL DATA:  Fall EXAM: RIGHT KNEE - COMPLETE 4+ VIEW COMPARISON:  None Available. FINDINGS: No evidence of fracture, dislocation, or joint effusion. No evidence of arthropathy or other focal bone abnormality. Soft tissues are unremarkable. IMPRESSION: Negative. Electronically Signed   By: Ulyses Jarred M.D.   On: 11/12/2021 19:50    Procedures Procedures    Medications Ordered in ED Medications  piperacillin-tazobactam (ZOSYN) IVPB 3.375 g (has no administration in time range)  iohexol (OMNIPAQUE) 350 MG/ML injection 75 mL (75 mLs Intravenous Contrast Given 11/12/21 2327)    ED Course/ Medical Decision Making/ A&P                           Medical Decision Making Amount and/or Complexity of Data Reviewed Labs: ordered. Radiology: ordered.  Risk Prescription drug management. Decision regarding hospitalization.   This patient presents to the ED for concern of fall, this involves an extensive number of treatment options, and is a complaint that carries with it a high risk of complications and morbidity.  The differential diagnosis includes head injury, fx   Co morbidities that complicate the patient evaluation  htn, dm, bph, gerd, testicular cancer, hld, chronic back pain, and chronic right leg pain   Additional history obtained:  Additional history obtained from epic chart review External records from outside source obtained and reviewed including EMS report   Lab Tests:  I Ordered, and personally interpreted labs.  The pertinent results include:  cbc with wbc elevated at 17, plt low at 144; bmp with bs 239, bnp 205.6; trop 23; ddimer elevated at 7.66   Imaging  Studies ordered:  I ordered imaging studies including CT head and CT c-spine, Xrays  chest, pelvis, both knees I independently visualized and interpreted imaging which showed  CT head:   IMPRESSION:  1. No acute intracranial process.  2. Chronic small-vessel ischemic changes throughout the  periventricular white matter and bilateral basal ganglia.  CT cervical:   IMPRESSION:  1. No acute cervical spine fracture.  2. Severe multilevel spondylosis  and facet hypertrophy as above.  CT chest:   IMPRESSION:  Isolated subsegmental right upper lobe pulmonary embolism. Overall  clot burden is very small.     Multifocal pneumonia, lower lobe predominant.   Plain xrays with nothing acute I agree with the radiologist interpretation   Cardiac Monitoring:  The patient was maintained on a cardiac monitor.  I personally viewed and interpreted the cardiac monitored which showed an underlying rhythm of: nsr   Medicines ordered and prescription drug management:  I ordered medication including zosyn  for aspiration pna  Reevaluation of the patient after these medicines showed that the patient stayed the same I have reviewed the patients home medicines and have made adjustments as needed   Test Considered:  Cta for pe   Critical Interventions:  oxygen   Consultations Obtained:  I requested consultation with the hospitalist (Dr. Velia Meyer),  and discussed lab and imaging findings as well as pertinent plan - they will admit.   Problem List / ED Course:  Fall:  no significant traumatic injury.   Hypoxia:  When pt came back from his head and neck CT, the nurse noted that there was emesis behind his head and his O2 sats had dropped.  He put him on nrb.  I have weaned him down to 6 and his rr is down..  CT chest done after he became hypoxic.  Pt started on zosyn.  Blood cultures and lactic drawn.  Pt's son notified of admission and dx.   Reevaluation:  After the interventions noted  above, I reevaluated the patient and found that they have :improved   Social Determinants of Health:  Lives in a snf   Dispostion:  After consideration of the diagnostic results and the patients response to treatment, I feel that the patent would benefit from admission.    CRITICAL CARE Performed by: Isla Pence   Total critical care time: 30 minutes  Critical care time was exclusive of separately billable procedures and treating other patients.  Critical care was necessary to treat or prevent imminent or life-threatening deterioration.  Critical care was time spent personally by me on the following activities: development of treatment plan with patient and/or surrogate as well as nursing, discussions with consultants, evaluation of patient's response to treatment, examination of patient, obtaining history from patient or surrogate, ordering and performing treatments and interventions, ordering and review of laboratory studies, ordering and review of radiographic studies, pulse oximetry and re-evaluation of patient's condition.         Final Clinical Impression(s) / ED Diagnoses Final diagnoses:  Fall, initial encounter  Minor head injury, initial encounter  Acute respiratory failure with hypoxia ([place])  Multifocal pneumonia  Other acute pulmonary embolism, unspecified whether acute cor pulmonale present [facility])    Rx / DC Orders ED Discharge Orders     None         Isla Pence, MD 11/12/21 2352

## 2021-11-12 NOTE — ED Triage Notes (Signed)
[facility]. Has had ongoing right leg pain- prior to fall. Hit head- no thinners. 220/110 --> 200/104 on arrival. 95% RA with EMS. Has taken all prescribed bp meds today. Aox4.

## 2021-11-13 ENCOUNTER — Other Ambulatory Visit: Payer: Self-pay

## 2021-11-13 ENCOUNTER — Inpatient Hospital Stay (HOSPITAL_COMMUNITY): Payer: Medicare Other

## 2021-11-13 ENCOUNTER — Encounter (HOSPITAL_COMMUNITY): Payer: Self-pay | Admitting: Internal Medicine

## 2021-11-13 DIAGNOSIS — M7989 Other specified soft tissue disorders: Secondary | ICD-10-CM

## 2021-11-13 DIAGNOSIS — J9601 Acute respiratory failure with hypoxia: Secondary | ICD-10-CM | POA: Diagnosis not present

## 2021-11-13 DIAGNOSIS — J189 Pneumonia, unspecified organism: Secondary | ICD-10-CM | POA: Diagnosis not present

## 2021-11-13 DIAGNOSIS — W19XXXA Unspecified fall, initial encounter: Secondary | ICD-10-CM | POA: Diagnosis not present

## 2021-11-13 DIAGNOSIS — Y92009 Unspecified place in unspecified non-institutional (private) residence as the place of occurrence of the external cause: Secondary | ICD-10-CM

## 2021-11-13 DIAGNOSIS — I5032 Chronic diastolic (congestive) heart failure: Secondary | ICD-10-CM | POA: Diagnosis not present

## 2021-11-13 DIAGNOSIS — I2609 Other pulmonary embolism with acute cor pulmonale: Secondary | ICD-10-CM

## 2021-11-13 DIAGNOSIS — E785 Hyperlipidemia, unspecified: Secondary | ICD-10-CM | POA: Diagnosis present

## 2021-11-13 DIAGNOSIS — I2699 Other pulmonary embolism without acute cor pulmonale: Secondary | ICD-10-CM

## 2021-11-13 DIAGNOSIS — A419 Sepsis, unspecified organism: Secondary | ICD-10-CM | POA: Diagnosis present

## 2021-11-13 DIAGNOSIS — I10 Essential (primary) hypertension: Secondary | ICD-10-CM | POA: Diagnosis present

## 2021-11-13 LAB — ECHOCARDIOGRAM COMPLETE
Area-P 1/2: 4.26 cm2
Calc EF: 51.7 %
P 1/2 time: 455 msec
Radius: 0.3 cm
S' Lateral: 3.4 cm
Single Plane A2C EF: 49.7 %
Single Plane A4C EF: 53.9 %
Weight: 2522.06 oz

## 2021-11-13 LAB — URINALYSIS, ROUTINE W REFLEX MICROSCOPIC
Bilirubin Urine: NEGATIVE
Glucose, UA: 50 mg/dL — AB
Hgb urine dipstick: NEGATIVE
Ketones, ur: NEGATIVE mg/dL
Leukocytes,Ua: NEGATIVE
Nitrite: NEGATIVE
Protein, ur: NEGATIVE mg/dL
Specific Gravity, Urine: 1.016 (ref 1.005–1.030)
pH: 7 (ref 5.0–8.0)

## 2021-11-13 LAB — CBC WITH DIFFERENTIAL/PLATELET
Abs Immature Granulocytes: 0.09 10*3/uL — ABNORMAL HIGH (ref 0.00–0.07)
Basophils Absolute: 0 10*3/uL (ref 0.0–0.1)
Basophils Relative: 0 %
Eosinophils Absolute: 0 10*3/uL (ref 0.0–0.5)
Eosinophils Relative: 0 %
HCT: 43.1 % (ref 39.0–52.0)
Hemoglobin: 14 g/dL (ref 13.0–17.0)
Immature Granulocytes: 1 %
Lymphocytes Relative: 2 %
Lymphs Abs: 0.2 10*3/uL — ABNORMAL LOW (ref 0.7–4.0)
MCH: 32.9 pg (ref 26.0–34.0)
MCHC: 32.5 g/dL (ref 30.0–36.0)
MCV: 101.2 fL — ABNORMAL HIGH (ref 80.0–100.0)
Monocytes Absolute: 0.5 10*3/uL (ref 0.1–1.0)
Monocytes Relative: 3 %
Neutro Abs: 15.2 10*3/uL — ABNORMAL HIGH (ref 1.7–7.7)
Neutrophils Relative %: 94 %
Platelets: 163 10*3/uL (ref 150–400)
RBC: 4.26 MIL/uL (ref 4.22–5.81)
RDW: 15.2 % (ref 11.5–15.5)
WBC: 16.1 10*3/uL — ABNORMAL HIGH (ref 4.0–10.5)
nRBC: 0 % (ref 0.0–0.2)

## 2021-11-13 LAB — COMPREHENSIVE METABOLIC PANEL
ALT: 16 U/L (ref 0–44)
AST: 21 U/L (ref 15–41)
Albumin: 2.8 g/dL — ABNORMAL LOW (ref 3.5–5.0)
Alkaline Phosphatase: 55 U/L (ref 38–126)
Anion gap: 7 (ref 5–15)
BUN: 18 mg/dL (ref 8–23)
CO2: 30 mmol/L (ref 22–32)
Calcium: 8.5 mg/dL — ABNORMAL LOW (ref 8.9–10.3)
Chloride: 102 mmol/L (ref 98–111)
Creatinine, Ser: 1.05 mg/dL (ref 0.61–1.24)
GFR, Estimated: >60 mL/min (ref >60)
Glucose, Bld: 170 mg/dL — ABNORMAL HIGH (ref 70–99)
Potassium: 4.9 mmol/L (ref 3.5–5.1)
Sodium: 139 mmol/L (ref 135–145)
Total Bilirubin: 0.7 mg/dL (ref 0.3–1.2)
Total Protein: 5.4 g/dL — ABNORMAL LOW (ref 6.5–8.1)

## 2021-11-13 LAB — GLUCOSE, CAPILLARY
Glucose-Capillary: 121 mg/dL — ABNORMAL HIGH (ref 70–99)
Glucose-Capillary: 147 mg/dL — ABNORMAL HIGH (ref 70–99)
Glucose-Capillary: 119 mg/dL — ABNORMAL HIGH (ref 70–99)
Glucose-Capillary: 163 mg/dL — ABNORMAL HIGH (ref 70–99)
Glucose-Capillary: 119 mg/dL — ABNORMAL HIGH (ref 70–99)

## 2021-11-13 LAB — HEMOGLOBIN A1C
Hgb A1c MFr Bld: 6.3 % — ABNORMAL HIGH (ref 4.8–5.6)
Mean Plasma Glucose: 134.11 mg/dL

## 2021-11-13 LAB — TROPONIN I (HIGH SENSITIVITY): Troponin I (High Sensitivity): 42 ng/L — ABNORMAL HIGH (ref <18)

## 2021-11-13 LAB — LACTIC ACID, PLASMA
Lactic Acid, Venous: 2.3 mmol/L (ref 0.5–1.9)
Lactic Acid, Venous: 2.6 mmol/L (ref 0.5–1.9)
Lactic Acid, Venous: 2.7 mmol/L (ref 0.5–1.9)

## 2021-11-13 LAB — MAGNESIUM
Magnesium: 1.8 mg/dL (ref 1.7–2.4)
Magnesium: 1.8 mg/dL (ref 1.7–2.4)

## 2021-11-13 LAB — PROCALCITONIN: Procalcitonin: 0.91 ng/mL

## 2021-11-13 LAB — PHOSPHORUS: Phosphorus: 3.4 mg/dL (ref 2.5–4.6)

## 2021-11-13 MED ORDER — HEPARIN (PORCINE) 25000 UT/250ML-% IV SOLN
1000.0000 [IU]/h | INTRAVENOUS | Status: DC
  Administered 2021-11-13: 1000 [IU]/h via INTRAVENOUS
  Filled 2021-11-13: qty 250

## 2021-11-13 MED ORDER — HEPARIN BOLUS VIA INFUSION
4000.0000 [IU] | Freq: Once | INTRAVENOUS | Status: AC
  Administered 2021-11-13: 4000 [IU] via INTRAVENOUS
  Filled 2021-11-13: qty 4000

## 2021-11-13 MED ORDER — ENOXAPARIN SODIUM 80 MG/0.8ML IJ SOSY
70.0000 mg | PREFILLED_SYRINGE | Freq: Two times a day (BID) | INTRAMUSCULAR | Status: AC
  Administered 2021-11-13 – 2021-11-14 (×3): 70 mg via SUBCUTANEOUS
  Filled 2021-11-13 (×3): qty 0.8

## 2021-11-13 MED ORDER — SODIUM CHLORIDE 0.9 % IV SOLN
3.0000 g | Freq: Three times a day (TID) | INTRAVENOUS | Status: DC
  Administered 2021-11-13 – 2021-11-16 (×10): 3 g via INTRAVENOUS
  Filled 2021-11-13 (×10): qty 8

## 2021-11-13 MED ORDER — LACTATED RINGERS IV SOLN: INTRAVENOUS | Status: AC

## 2021-11-13 MED ORDER — LEVOTHYROXINE SODIUM 75 MCG PO TABS
75.0000 ug | ORAL_TABLET | Freq: Every day | ORAL | Status: DC
  Administered 2021-11-13 – 2021-11-16 (×4): 75 ug via ORAL
  Filled 2021-11-13 (×4): qty 1

## 2021-11-13 MED ORDER — ROSUVASTATIN CALCIUM 20 MG PO TABS
20.0000 mg | ORAL_TABLET | Freq: Every day | ORAL | Status: DC
  Administered 2021-11-13 – 2021-11-16 (×4): 20 mg via ORAL
  Filled 2021-11-13 (×4): qty 1

## 2021-11-13 MED ORDER — ENOXAPARIN SODIUM 80 MG/0.8ML IJ SOSY
70.0000 mg | PREFILLED_SYRINGE | INTRAMUSCULAR | Status: AC
  Administered 2021-11-13: 70 mg via SUBCUTANEOUS
  Filled 2021-11-13: qty 0.8

## 2021-11-13 MED ORDER — ESCITALOPRAM OXALATE 10 MG PO TABS
5.0000 mg | ORAL_TABLET | Freq: Every day | ORAL | Status: DC
  Administered 2021-11-13 – 2021-11-16 (×4): 5 mg via ORAL
  Filled 2021-11-13 (×4): qty 1

## 2021-11-13 MED ORDER — INSULIN ASPART 100 UNIT/ML IJ SOLN
0.0000 [IU] | Freq: Three times a day (TID) | INTRAMUSCULAR | Status: DC
  Administered 2021-11-13: 2 [IU] via SUBCUTANEOUS
  Administered 2021-11-16: 1 [IU] via SUBCUTANEOUS

## 2021-11-13 MED ORDER — PIPERACILLIN-TAZOBACTAM 3.375 G IVPB
3.3750 g | Freq: Three times a day (TID) | INTRAVENOUS | Status: DC
  Administered 2021-11-13: 3.375 g via INTRAVENOUS
  Filled 2021-11-13 (×2): qty 50

## 2021-11-13 NOTE — Progress Notes (Signed)
Lower extremity venous bilateral study completed.   Please see CV Proc for preliminary results.   Tyashia Morrisette, RDMS, RVT  

## 2021-11-13 NOTE — Assessment & Plan Note (Signed)
  #)   Acute hypoxic respiratory failure: in the context of acute respiratory symptoms and no known baseline supplemental O2 requirements, presenting O2 sat noted to be 87% on room air, subsequently improving into the mid 90s on 6 L nonrebreather. Appears to be on basis of severe sepsis due to multifocal pneumonia per preliminary CTA chest result, including concern for contributory aspiration pneumonia, as further detailed above.  We will follow-up for final radiology read regarding the CT chest, including for evaluation of pulmonary embolism given elevated initial D-dimer.  No clinical or radiographic evidence to suggest acutely decompensated heart failure at this time, although the patient is at risk for development of such in the context of his known history of chronic diastolic heart failure, as above.  No known chronic underlying pulmonary conditions.   In terms of other considered etiologies, ACS appears less likely at this time in the absence of any recent CP.  Interpretation of the EKG limited by the presence of artifact, but does not appear to demonstrate any overt acute ischemic changes, including no evidence of ST elevation.  He has a very mild elevation in his troponin, with initial value of 18, and repeat value trending up slightly to 23.  Suspect that this is on the basis of a type II supply/demand mismatch as a result of diminished oxygen delivery capacity associated with acute hypoxic respiratory failure as opposed to representing a type I process due to acute plaque rupture.  We will continue to trend troponin, while engaging in further evaluation management of suspected primary acute hypoxic respiratory failure due to multifocal pneumonia, as above.   Plan: further evaluation/management of presenting severe sepsis due to multifocal pneumonia, as above. Monitor continuous pulse ox with prn supplemental O2 to maintain O2 sats greater than or equal to 92%. monitor on telemetry. CMP/CBC in the  AM. Check serum Mg and Phos levels.  Flutter valve, incentive spirometry.  Follow for final radiology read regarding CT chest, as above.  Add on procalcitonin level.  Trend troponin.

## 2021-11-13 NOTE — Assessment & Plan Note (Signed)
(  please see multifocal pneumonia)

## 2021-11-13 NOTE — Progress Notes (Signed)
Adm to rm 5 from ED per stretcher. Pt awake, alert, and oriented x4. No distress noted. Pt with NRB oxygen on. Given bath and assessment and chg to 6 L/M [place].

## 2021-11-13 NOTE — Progress Notes (Signed)
  Echocardiogram 2D Echocardiogram has been performed.  Robert Beltran 11/13/2021, 1:49 PM

## 2021-11-13 NOTE — H&P (Signed)
History and Physical    PLEASE NOTE THAT DRAGON DICTATION SOFTWARE WAS USED IN THE CONSTRUCTION OF THIS NOTE.   [name]:[id number] DOB: June 28, 1924 DOA: 11/12/2021  PCP: Crist Infante, MD  Patient coming from: SNF  I have personally briefly reviewed patient's old medical records in [facility]  Chief Complaint: Fall  HPI: Robert Beltran is a 88 y.o. male with medical history significant for type 2 diabetes mellitus, essential hypertension, hyperlipidemia, who is admitted to [facility] on 11/12/2021 with severe sepsis due to multifocal pneumonia after presenting from SNF to [facility] ED complaining of fall.   The patient reports ground-level fall while ambulating at his SNF earlier today, noting that this fall occurred as a consequence of him tripping.  He states that he hit his head has a component of this fall, but denies any associated loss of consciousness.  Denies any associated, preceding, or ensuing chest pain.  Denies any associated palpitations, diaphoresis, dizziness, presyncope, or syncope.  Denies any significant ensuing acute arthralgias or myalgias.  Confirms no baseline supplemental oxygen requirements.  Occurred In the absence of any associated acute focal weakness, acute focal numbness, paresthesias, facial droop, slurred speech, expressive aphasia, acute change in vision, dysphagia, vertigo.  He does however report 3 to 4 days of progressive nonproductive cough, which is new for him.  Denies any associated subjective fever, chills, rigors, or generalized myalgias.  No recent dysuria or gross hematuria.  Denies any recent diarrhea, abdominal pain, or rash.  No hemoptysis, or new onset calf tenderness nor any new onset lower extremity erythema.  Otherwise, denies any recent trauma or travel.  Not on any blood thinners as an outpatient, including no aspirin.  Medical history notable for chronic diastolic heart failure, with most recent echocardiogram in February  2014, which is notable for LVEF 60 to 65%, mild LVH, grade 1 diastolic dysfunction.  Patient denies any recent orthopnea or PND.    ED Course:  Vital signs in the ED were notable for the following: Temperature max 99.5; heart rate 83-1 04; blood pressure 134/63; respiratory rate 20-31, initial oxygen saturation 87% on room air, with ensuing improvement to 96 to 97% on 6 L nonrebreather.  Labs were notable for the following: BMP notable for the following: Sodium 136, creatinine 1.04.  BNP 205, high-sensitivity troponin I initially 18, with repeat value trending up slightly 23.  CBC notable for white blood cell count 17,000 with 94% neutrophils, hemoglobin 14.3.  Initial lactate 2.3, with repeat value trending up slightly to 2.6.  Urinalysis notable for no white blood cells.  Blood cultures x2 were collected prior to initiation of IV antibiotics.  Imaging and additional notable ED work-up: EKG, the interpretation of which was limited by the presence of artifact, but within these confines, appears to show sinus rhythm with heart rate 82, left bundle branch block, nonspecific T wave inversion in AV L, and no evidence of ST changes, including no evidence of ST elevation.  Chest x-ray showed evidence of bibasilar atelectasis, in the absence of edema, infiltrate, or effusion, nor any evidence of pneumothorax.  CT head showed no evidence of acute intracranial process, including no evidence of intracranial hemorrhage, will CT cervical spine showed no evidence of acute cervical fracture or any evidence of cervical subluxation.  Additionally, the patient underwent CTA chest.  During the process of the scan, he reportedly vomited while supine.  Upon patient's return from CTA, EDP noted the patient to have vomitus on his  face and neck that was not noted prior to the scan.  Preliminary radiology read of the CTA chest was notable for multifocal pneumonia.  While in the ED, the following were administered:  Zosyn.  Subsequently, the patient was admitted for further evaluation management of severe sepsis due to multifocal pneumonia, complicated by acute hypoxic respiratory failure after presenting for evaluation of mechanical ground-level fall.    Review of Systems: As per HPI otherwise 10 point review of systems negative.   Past Medical History:  Diagnosis Date   Arthritis    Benign prostate hyperplasia    Cancer ([place])    testicular cancer 1964   Chronic back pain    Depression    Diabetes mellitus without complication (HCC)    DJD (degenerative joint disease)    GERD (gastroesophageal reflux disease)    HLD (hyperlipidemia)    Hypertension    sees Dr. Crist Infante,     Past Surgical History:  Procedure Laterality Date   APPENDECTOMY     1995   EYE SURGERY     bilateral surgery   LUMBAR LAMINECTOMY/DECOMPRESSION MICRODISCECTOMY N/A 08/29/2012   Procedure: LUMBAR LAMINECTOMY/DECOMPRESSION MICRODISCECTOMY 2 LEVELS;  Surgeon: Eustace Moore, MD;  Location: [place] [facility];  Service: Neurosurgery;  Laterality: N/A;   SHOULDER SURGERY     right    Social History:  reports that he has quit smoking. He has never used smokeless tobacco. He reports current alcohol use. He reports that he does not use drugs.   Allergies  Allergen Reactions   Aspirin Other (See Comments)    GI bleed   Nsaids Other (See Comments)    GI distress   Other Nausea Only    Coricidin   Bactrim [Sulfamethoxazole-Trimethoprim] Rash    Family History  Problem Relation Age of Onset   Heart attack Mother    Colon cancer Brother     Family history reviewed and not pertinent    Prior to Admission medications   Medication Sig Start Date End Date Taking? Authorizing Provider  amLODipine (NORVASC) 5 MG tablet Take 5 mg by mouth daily.    [provider]  candesartan (ATACAND) 8 MG tablet Take 8 mg by mouth in the morning and at bedtime.    [provider]  chlorthalidone (HYGROTON) 25 MG  tablet Take 25 mg by mouth daily.    [provider]  ergocalciferol (VITAMIN D2) 50000 UNITS capsule Take 50,000 Units by mouth once a week.    [provider]  escitalopram (LEXAPRO) 5 MG tablet Take 5 mg by mouth daily.    [provider]  levothyroxine (SYNTHROID) 75 MCG tablet Take 75 mcg by mouth daily before breakfast.    [provider]  metFORMIN (GLUCOPHAGE) 500 MG tablet Take 500 mg by mouth 2 (two) times daily with a meal.    [provider]  Multiple Vitamin (MULTIVITAMIN WITH MINERALS) TABS Take 1 tablet by mouth daily.    [provider]  Omeprazole Magnesium (PRILOSEC OTC PO) Take 20 mg by mouth daily.    [provider]  polyethylene glycol (MIRALAX / GLYCOLAX) 17 g packet Take 17 g by mouth daily.    [provider]  ramipril (ALTACE) 10 MG capsule Take 10 mg by mouth daily.    [provider]  rOPINIRole (REQUIP) 0.25 MG tablet Take 0.25-1 mg by mouth daily as needed. For restless leg    [provider]  rosuvastatin (CRESTOR) 20 MG tablet Take 20  mg by mouth daily.    [provider]  traMADol (ULTRAM) 50 MG tablet Take 50 mg by mouth every 6 (six) hours as needed for pain.    [provider]     Objective    Physical Exam: Vitals:   11/13/21 0030 11/13/21 0045 11/13/21 0131 11/13/21 0300  BP: 139/67 136/60 132/66 (!) 114/39  Pulse: 92 97 91 97  Resp: (!) 26 (!) _0 Temp:   98.2 F (36.8 C) 98.2 F (36.8 C)  TempSrc:   Oral Oral  SpO2: 98% 99% 91% 90%  Weight:   71.5 kg     General: appears to be stated age; alert, oriented; mildly increased work of breathing Skin: warm, dry, no rash Head:  AT/[place] Mouth:  Oral mucosa membranes appear dry, normal dentition Neck: supple; trachea midline Heart:  RRR; did not appreciate any M/R/G Lungs: Bilateral rales noted; did not appreciate any wheezes or rhonchi Abdomen: + BS; soft, ND, NT Vascular: 2+ pedal  pulses b/l; 2+ radial pulses b/l Extremities: no peripheral edema, no muscle wasting Neuro: strength and sensation intact in upper and lower extremities b/l    Labs on Admission: I have personally reviewed following labs and imaging studies  CBC: Recent Labs  Lab 11/12/21 2110 11/13/21 0411  WBC 17.0* 16.1*  NEUTROABS 16.0* 15.2*  HGB 14.3 14.0  HCT 43.5 43.1  MCV 103.6* 101.2*  PLT 144* 474   Basic Metabolic Panel: Recent Labs  Lab 11/12/21 2055 11/12/21 2218 11/13/21 0411  NA 136  --  139  K 5.1  --  4.9  CL 103  --  102  CO2 22  --  30  GLUCOSE 239*  --  170*  BUN 17  --  18  CREATININE 1.04  --  1.05  CALCIUM 8.6*  --  8.5*  MG  --  1.8 1.8  PHOS  --   --  3.4   GFR: CrCl cannot be calculated (Unknown ideal weight.). Liver Function Tests: Recent Labs  Lab 11/13/21 0411  AST 21  ALT 16  ALKPHOS 55  BILITOT 0.7  PROT 5.4*  ALBUMIN 2.8*   No results for input(s): LIPASE, AMYLASE in the last 168 hours. No results for input(s): AMMONIA in the last 168 hours. Coagulation Profile: No results for input(s): INR, PROTIME in the last 168 hours. Cardiac Enzymes: No results for input(s): CKTOTAL, CKMB, CKMBINDEX, TROPONINI in the last 168 hours. BNP (last 3 results) No results for input(s): PROBNP in the last 8760 hours. HbA1C: No results for input(s): HGBA1C in the last 72 hours. CBG: No results for input(s): GLUCAP in the last 168 hours. Lipid Profile: No results for input(s): CHOL, HDL, LDLCALC, TRIG, CHOLHDL, LDLDIRECT in the last 72 hours. Thyroid Function Tests: No results for input(s): TSH, T4TOTAL, FREET4, T3FREE, THYROIDAB in the last 72 hours. Anemia Panel: No results for input(s): VITAMINB12, FOLATE, FERRITIN, TIBC, IRON, RETICCTPCT in the last 72 hours. Urine analysis:    Component Value Date/Time   COLORURINE YELLOW 11/12/2021 2359   APPEARANCEUR CLEAR 11/12/2021 2359   LABSPEC 1.016 11/12/2021 2359   PHURINE 7.0 11/12/2021 2359    GLUCOSEU 50 (A) 11/12/2021 2359   HGBUR NEGATIVE 11/12/2021 2359   BILIRUBINUR NEGATIVE 11/12/2021 2359   KETONESUR NEGATIVE 11/12/2021 2359   PROTEINUR NEGATIVE 11/12/2021 2359   NITRITE NEGATIVE 11/12/2021 2359   LEUKOCYTESUR NEGATIVE 11/12/2021 2359    Radiological Exams on Admission: DG Chest 2 View  Result Date: 11/12/2021  CLINICAL DATA:  Fall EXAM: CHEST - 2 VIEW COMPARISON:  08/29/2012 FINDINGS: Shallow lung inflation with bibasilar atelectasis. Mild cardiomegaly. Normal pleural spaces. IMPRESSION: Shallow lung inflation with bibasilar atelectasis. Electronically Signed   By: Ulyses Jarred M.D.   On: 11/12/2021 19:44   DG Pelvis 1-2 Views  Result Date: 11/12/2021 CLINICAL DATA:  Fall EXAM: PELVIS - 1-2 VIEW COMPARISON:  None Available. FINDINGS: There is no evidence of pelvic fracture or diastasis. No pelvic bone lesions are seen. Dense stool in the colon. IMPRESSION: Negative. Electronically Signed   By: Ulyses Jarred M.D.   On: 11/12/2021 19:52   CT Head Wo Contrast  Result Date: 11/12/2021 CLINICAL DATA:  [place], hit head EXAM: CT HEAD WITHOUT CONTRAST TECHNIQUE: Contiguous axial images were obtained from the base of the skull through the vertex without intravenous contrast. RADIATION DOSE REDUCTION: This exam was performed according to the departmental dose-optimization program which includes automated exposure control, adjustment of the mA and/or kV according to patient size and/or use of iterative reconstruction technique. COMPARISON:  10/30/2015 FINDINGS: Brain: Chronic small vessel ischemic changes are seen throughout the periventricular white matter and bilateral basal ganglia, progressive since prior exam. No evidence of acute infarct or hemorrhage. Lateral ventricles and midline structures are unremarkable. No acute extra-axial fluid collections. No mass effect. Vascular: Diffuse atherosclerosis of the internal carotid arteries. No hyperdense vessel. Skull: Normal. Negative for  fracture or focal lesion. Sinuses/Orbits: No acute finding. Other: None. IMPRESSION: 1. No acute intracranial process. 2. Chronic small-vessel ischemic changes throughout the periventricular white matter and bilateral basal ganglia. Electronically Signed   By: Randa Ngo M.D.   On: 11/12/2021 20:01   CT Angio Chest PE W and/or Wo Contrast  Result Date: 11/12/2021 CLINICAL DATA:  Fall, evaluate for PE EXAM: CT ANGIOGRAPHY CHEST WITH CONTRAST TECHNIQUE: Multidetector CT imaging of the chest was performed using the standard protocol during bolus administration of intravenous contrast. Multiplanar CT image reconstructions and MIPs were obtained to evaluate the vascular anatomy. RADIATION DOSE REDUCTION: This exam was performed according to the departmental dose-optimization program which includes automated exposure control, adjustment of the mA and/or kV according to patient size and/or use of iterative reconstruction technique. CONTRAST:  21m OMNIPAQUE IOHEXOL 350 MG/ML SOLN COMPARISON:  Chest radiograph dated 11/12/2021 FINDINGS: Cardiovascular: Satisfactory opacification of the bilateral pulmonary arteries to the segmental level. Evaluation is mildly constrained by respiratory motion. Within that constraint, there is an isolated subsegmental right upper lobe pulmonary embolism (series 7/image 124). Overall clot burden is very small. Study is not tailored for evaluation of the thoracic aorta. Atherosclerotic calcifications of the arch. Heart is normal in size.  No pericardial effusion. Three vessel coronary atherosclerosis. Mediastinum/Nodes: Small mediastinal lymph nodes, likely reactive. Visualized thyroid is unremarkable. Lungs/Pleura: Multifocal patchy opacities in the lungs bilaterally, lower lobe predominant, suspicious for pneumonia. Superimposed bilateral lower lobe atelectasis is possible. Evaluation lung parenchyma is constrained by respiratory motion. Within that constraint, there are no suspicious  pulmonary nodules. No pleural effusion or pneumothorax. Upper Abdomen: Visualized upper abdomen is grossly unremarkable, noting vascular calcifications. Musculoskeletal: Degenerative changes at T12-L1. No rib fracture is seen. Degenerative changes of the bilateral shoulders, right greater than left. Review of the MIP images confirms the above findings. IMPRESSION: Isolated subsegmental right upper lobe pulmonary embolism. Overall clot burden is very small. Multifocal pneumonia, lower lobe predominant. Critical Value/emergent results were called by telephone at the time of interpretation on 11/12/2021 at 11:43 pm to provider JULIE HAVILAND , who verbally  acknowledged these results. Aortic Atherosclerosis (ICD10-I70.0). Electronically Signed   By: Julian Hy M.D.   On: 11/12/2021 23:46   CT Cervical Spine Wo Contrast  Result Date: 11/12/2021 CLINICAL DATA:  [place], hit head EXAM: CT CERVICAL SPINE WITHOUT CONTRAST TECHNIQUE: Multidetector CT imaging of the cervical spine was performed without intravenous contrast. Multiplanar CT image reconstructions were also generated. RADIATION DOSE REDUCTION: This exam was performed according to the departmental dose-optimization program which includes automated exposure control, adjustment of the mA and/or kV according to patient size and/or use of iterative reconstruction technique. COMPARISON:  None Available. FINDINGS: Alignment: Alignment is grossly anatomic. Skull base and vertebrae: No acute fracture. No primary bone lesion or focal pathologic process. Soft tissues and spinal canal: No prevertebral fluid or swelling. No visible canal hematoma. Disc levels: There is severe multilevel spondylosis and facet hypertrophy. There is bony fusion across the C3-4 disc space. Severe disc space narrowing and osteophyte formation are seen at C4-5, C5-6, and C6-7. Diffuse facet hypertrophic changes greatest at C2-3 and C3-4. Upper chest: Airway is patent. Lung apices are clear.  Incidental note is made of fluid-filled upper thoracic esophagus which could reflect reflux. Other: Reconstructed images demonstrate no additional findings. IMPRESSION: 1. No acute cervical spine fracture. 2. Severe multilevel spondylosis and facet hypertrophy as above. Electronically Signed   By: Randa Ngo M.D.   On: 11/12/2021 20:04   DG Knee Complete 4 Views Left  Result Date: 11/12/2021 CLINICAL DATA:  Fall EXAM: LEFT KNEE - COMPLETE 4+ VIEW COMPARISON:  None Available. FINDINGS: No evidence of fracture, dislocation, or joint effusion. No evidence of arthropathy or other focal bone abnormality. Soft tissues are unremarkable. IMPRESSION: Negative. Electronically Signed   By: Ulyses Jarred M.D.   On: 11/12/2021 19:49   DG Knee Complete 4 Views Right  Result Date: 11/12/2021 CLINICAL DATA:  Fall EXAM: RIGHT KNEE - COMPLETE 4+ VIEW COMPARISON:  None Available. FINDINGS: No evidence of fracture, dislocation, or joint effusion. No evidence of arthropathy or other focal bone abnormality. Soft tissues are unremarkable. IMPRESSION: Negative. Electronically Signed   By: Ulyses Jarred M.D.   On: 11/12/2021 19:50     EKG: Independently reviewed, with result as described above.    Assessment/Plan   Principal Problem:   Multifocal pneumonia Active Problems:   Type 2 diabetes mellitus with stage 3a chronic kidney disease, without long-term current use of insulin (HCC)   Severe sepsis (HCC)   Acute respiratory failure with hypoxia (HCC)   Fall at home, initial encounter   HLD (hyperlipidemia)   Hypertension   Chronic diastolic CHF (congestive heart failure) ([place])      #) Severe sepsis due to multifocal pneumonia: Diagnosis on the basis of 3 to 4 days of new onset cough, evidence of increased work of breathing, and new oxygen requirement, and preliminary report regarding CTA chest showing evidence of multifocal pneumonia, with concern for potential aspiration given report that the patient  vomited while supine in the CT scanner.   SIRS criteria met via presenting leukocytosis as well as tachycardia and tachypnea. Lactic acid level: Initially 2.3, with repeat value trending up slightly to 2.6. Of note, given the associated presence of suspected end organ damage in the form of concominant presenting acute hypoxic respiratory failure as well as elevated lactate, criteria are met for pt's sepsis to be considered severe in nature. However, in the absence of lactic acid level that is greater than or equal to 4.0, and in the absence of any  associated hypotension refractory to IVF's, there are no indications for administration of a 30 mL/kg IVF bolus at this time.  Of note, in the setting of the patient's age as well as history of chronic diastolic heart failure, will initiate brief course of gentle IV fluids, with close monitoring for result of repeat lactate.  Additional ED work-up/management notable for: Collection of blood cultures x2 followed by initiation of Zosyn, which will be continued for its inclusion of anaerobic coverage given suspicion for an element of aspiration.  No e/o additional infectious process at this time, including urinalysis that was not consistent with UTI.    Plan: CBC w/ diff and CMP in AM.  Follow for results of blood cx's x 2. Abx: Continue Zosyn, as above.  Lactated Ringer's at 75 cc/h x 4 hours, with repeat lactate ordered.  Add on procalcitonin level.  Aspiration precautions ordered.  Flutter valve, incentive spirometry.         #) Acute hypoxic respiratory failure: in the context of acute respiratory symptoms and no known baseline supplemental O2 requirements, presenting O2 sat noted to be 87% on room air, subsequently improving into the mid 90s on 6 L nonrebreather. Appears to be on basis of severe sepsis due to multifocal pneumonia per preliminary CTA chest result, including concern for contributory aspiration pneumonia, as further detailed above.  We  will follow-up for final radiology read regarding the CT chest, including for evaluation of pulmonary embolism given elevated initial D-dimer.  No clinical or radiographic evidence to suggest acutely decompensated heart failure at this time, although the patient is at risk for development of such in the context of his known history of chronic diastolic heart failure, as above.  No known chronic underlying pulmonary conditions.   In terms of other considered etiologies, ACS appears less likely at this time in the absence of any recent CP.  Interpretation of the EKG limited by the presence of artifact, but does not appear to demonstrate any overt acute ischemic changes, including no evidence of ST elevation.  He has a very mild elevation in his troponin, with initial value of 18, and repeat value trending up slightly to 23.  Suspect that this is on the basis of a type II supply/demand mismatch as a result of diminished oxygen delivery capacity associated with acute hypoxic respiratory failure as opposed to representing a type I process due to acute plaque rupture.  We will continue to trend troponin, while engaging in further evaluation management of suspected primary acute hypoxic respiratory failure due to multifocal pneumonia, as above.   Plan: further evaluation/management of presenting severe sepsis due to multifocal pneumonia, as above. Monitor continuous pulse ox with prn supplemental O2 to maintain O2 sats greater than or equal to 92%. monitor on telemetry. CMP/CBC in the AM. Check serum Mg and Phos levels.  Flutter valve, incentive spirometry.  Follow for final radiology read regarding CT chest, as above.  Add on procalcitonin level.  Trend troponin.       #(Ground-level mechanical fall: Appears to have experienced a mechanical ground-level fall while ambulating at his SNF earlier today, without any associated loss of consciousness, although he did hit his head as a component of this fall.  Not on  any blood thinners.  Ensuing CT head showed no evidence of acute intracranial process, including no evidence of intracranial hemorrhage, while CT cervical spine showed no evidence of cervical subluxation injury.  Suspect that the patient was predisposed, mechanical fall as a consequence of  his presenting severe sepsis due to multifocal pneumonia without evidence of additional underlying infectious process, including urinalysis that was not suggestive of UTI.  No evidence of acute focal neurologic deficits to suggest acute ischemic CVA.  Plan: Fall precautions ordered.  Further evaluation management of severe sepsis due to multifocal pneumonia, as above.  Repeat CMP and CBC in the morning.         #) Type 2 Diabetes Mellitus: documented history of such. Home insulin regimen: None. Home oral hypoglycemic agents: Metformin. presenting blood sugar: 239.    Plan: accuchecks QAC and HS with very low dose SSI. hold home oral hypoglycemic agents during this hospitalization.           #) Essential Hypertension: documented h/o such, with outpatient antihypertensive regimen including amlodipine, candesartan, chlorthalidone.  SBP's in the ED today: Normotensive.  However, in the setting of severe sepsis, will hold home antihypertensive medications for now.  Plan: Close monitoring of subsequent BP via routine VS. hold home antihypertensive medications for now, as above.           #) Hyperlipidemia: documented h/o such. On high intensity rosuvastatin as outpatient.    Plan: continue home statin.            #) acquired hypothyroidism: documented h/o such, on Synthroid as outpatient.   Plan: cont home Synthroid.            #) Chronic diastolic heart failure: documented history of such, with most recent echocardiogram performed in February 2014, with results notable for LVEF 60 to 65% as well as grade 1 diastolic dysfunction, with additional results as conveyed above. No  clinical evidence to suggest acutely decompensated heart failure at this time. home diuretic regimen reportedly consists of the following: Chlorthalidone in the absence of any loop diuretics.    Plan: monitor strict I's & O's and daily weights. Repeat BMP in AM. Check serum mag level.  Holding home chlorthalidone in the setting of presenting severe sepsis.      DVT prophylaxis: SCD's   Code Status: Full code Family Communication: none Disposition Plan: Per Rounding Team Consults called: none;  Admission status: Inpatient   PLEASE NOTE THAT DRAGON DICTATION SOFTWARE WAS USED IN THE CONSTRUCTION OF THIS NOTE.   [place] DO [facility]  From [place]   11/13/2021, 6:00 AM

## 2021-11-13 NOTE — Assessment & Plan Note (Signed)
 #)   Essential Hypertension: documented h/o such, with outpatient antihypertensive regimen including amlodipine, candesartan, chlorthalidone.  SBP's in the ED today: Normotensive.  However, in the setting of severe sepsis, will hold home antihypertensive medications for now.  Plan: Close monitoring of subsequent BP via routine VS. hold home antihypertensive medications for now, as above.

## 2021-11-13 NOTE — Progress Notes (Signed)
ANTICOAGULATION CONSULT NOTE - Initial Consult  Pharmacy Consult for Heparin>>Lovenox Indication: pulmonary embolus  Allergies  Allergen Reactions   Aspirin Other (See Comments)    GI bleed   Nsaids Other (See Comments)    GI distress   Other Nausea Only    Coricidin   Bactrim [Sulfamethoxazole-Trimethoprim] Rash    Patient Measurements: Weight: 71.5 kg (157 lb 10.1 oz)  Vital Signs: Temp: 98.2 F (36.8 C) (05/19 1137) Temp Source: Oral (05/19 1137) BP: 123/45 (05/19 1137) Pulse Rate: 75 (05/19 1137)  Labs: Recent Labs    11/12/21 2008 11/12/21 2055 11/12/21 2110 11/12/21 2218 11/13/21 0411  HGB  --   --  14.3  --  14.0  HCT  --   --  43.5  --  43.1  PLT  --   --  144*  --  163  CREATININE  --  1.04  --   --  1.05  TROPONINIHS 18*  --   --  23* 42*     CrCl cannot be calculated (Unknown ideal weight.).   Medical History: Past Medical History:  Diagnosis Date   Arthritis    Benign prostate hyperplasia    Cancer ([place])    testicular cancer 1964   Chronic back pain    Depression    Diabetes mellitus without complication (HCC)    DJD (degenerative joint disease)    GERD (gastroesophageal reflux disease)    HLD (hyperlipidemia)    Hypertension    sees Dr. Crist Infante,     Medications:  No current facility-administered medications on file prior to encounter.   Current Outpatient Medications on File Prior to Encounter  Medication Sig Dispense Refill   amLODipine (NORVASC) 5 MG tablet Take 5 mg by mouth daily.     candesartan (ATACAND) 8 MG tablet Take 8 mg by mouth in the morning and at bedtime.     chlorthalidone (HYGROTON) 25 MG tablet Take 25 mg by mouth daily.     ergocalciferol (VITAMIN D2) 50000 UNITS capsule Take 50,000 Units by mouth once a week.     escitalopram (LEXAPRO) 5 MG tablet Take 5 mg by mouth daily.     levothyroxine (SYNTHROID) 75 MCG tablet Take 75 mcg by mouth daily before breakfast.     metFORMIN (GLUCOPHAGE) 500 MG tablet Take  500 mg by mouth 2 (two) times daily with a meal.     Multiple Vitamin (MULTIVITAMIN WITH MINERALS) TABS Take 1 tablet by mouth daily.     Omeprazole Magnesium (PRILOSEC OTC PO) Take 20 mg by mouth daily.     polyethylene glycol (MIRALAX / GLYCOLAX) 17 g packet Take 17 g by mouth daily.     ramipril (ALTACE) 10 MG capsule Take 10 mg by mouth daily.     rOPINIRole (REQUIP) 0.25 MG tablet Take 0.25-1 mg by mouth daily as needed. For restless leg     rosuvastatin (CRESTOR) 20 MG tablet Take 20 mg by mouth daily.     traMADol (ULTRAM) 50 MG tablet Take 50 mg by mouth every 6 (six) hours as needed for pain.       Assessment: 88 y.o. male with PE for heparin.  Ok to change heparin to Lovenox per Dr. Sloan Leiter. Plan to switch to NOAC when PO intake is better.   Goal of Therapy:  Anti-Xa 0.6-1 Monitor platelets by anticoagulation protocol: Yes   Plan:  Dc heparin Lovenox '70mg'$  PO BID F/u PO NOAC   Onnie Boer, PharmD, BCIDP, AAHIVP, CPP Infectious  Disease Pharmacist 11/13/2021 2:09 PM

## 2021-11-13 NOTE — Assessment & Plan Note (Signed)
  #)   Chronic diastolic heart failure: documented history of such, with most recent echocardiogram performed in February 2014, with results notable for LVEF 60 to 65% as well as grade 1 diastolic dysfunction, with additional results as conveyed above. No clinical evidence to suggest acutely decompensated heart failure at this time. home diuretic regimen reportedly consists of the following: Chlorthalidone in the absence of any loop diuretics.    Plan: monitor strict I's & O's and daily weights. Repeat BMP in AM. Check serum mag level.  Holding home chlorthalidone in the setting of presenting severe sepsis.

## 2021-11-13 NOTE — Assessment & Plan Note (Signed)
 #)   Type 2 Diabetes Mellitus: documented history of such. Home insulin regimen: None. Home oral hypoglycemic agents: Metformin. presenting blood sugar: 239.    Plan: accuchecks QAC and HS with very low dose SSI. hold home oral hypoglycemic agents during this hospitalization.

## 2021-11-13 NOTE — Progress Notes (Signed)
ANTICOAGULATION CONSULT NOTE - Initial Consult  Pharmacy Consult for Heparin Indication: pulmonary embolus  Allergies  Allergen Reactions   Aspirin Other (See Comments)    GI bleed   Nsaids Other (See Comments)    GI distress   Other Nausea Only    Coricidin   Bactrim [Sulfamethoxazole-Trimethoprim] Rash    Patient Measurements: Weight: 71.5 kg (157 lb 10.1 oz)  Vital Signs: Temp: 98.2 F (36.8 C) (05/19 0300) Temp Source: Oral (05/19 0300) BP: 114/39 (05/19 0300) Pulse Rate: 97 (05/19 0300)  Labs: Recent Labs    11/12/21 2008 11/12/21 2055 11/12/21 2110 11/12/21 2218 11/13/21 0411  HGB  --   --  14.3  --  14.0  HCT  --   --  43.5  --  43.1  PLT  --   --  144*  --  163  CREATININE  --  1.04  --   --  1.05  TROPONINIHS 18*  --   --  23*  --     CrCl cannot be calculated (Unknown ideal weight.).   Medical History: Past Medical History:  Diagnosis Date   Arthritis    Benign prostate hyperplasia    Cancer ([place])    testicular cancer 1964   Chronic back pain    Depression    Diabetes mellitus without complication (HCC)    DJD (degenerative joint disease)    GERD (gastroesophageal reflux disease)    HLD (hyperlipidemia)    Hypertension    sees Dr. Crist Infante,     Medications:  No current facility-administered medications on file prior to encounter.   Current Outpatient Medications on File Prior to Encounter  Medication Sig Dispense Refill   amLODipine (NORVASC) 5 MG tablet Take 5 mg by mouth daily.     candesartan (ATACAND) 8 MG tablet Take 8 mg by mouth in the morning and at bedtime.     chlorthalidone (HYGROTON) 25 MG tablet Take 25 mg by mouth daily.     ergocalciferol (VITAMIN D2) 50000 UNITS capsule Take 50,000 Units by mouth once a week.     escitalopram (LEXAPRO) 5 MG tablet Take 5 mg by mouth daily.     levothyroxine (SYNTHROID) 75 MCG tablet Take 75 mcg by mouth daily before breakfast.     metFORMIN (GLUCOPHAGE) 500 MG tablet Take 500 mg by  mouth 2 (two) times daily with a meal.     Multiple Vitamin (MULTIVITAMIN WITH MINERALS) TABS Take 1 tablet by mouth daily.     Omeprazole Magnesium (PRILOSEC OTC PO) Take 20 mg by mouth daily.     polyethylene glycol (MIRALAX / GLYCOLAX) 17 g packet Take 17 g by mouth daily.     ramipril (ALTACE) 10 MG capsule Take 10 mg by mouth daily.     rOPINIRole (REQUIP) 0.25 MG tablet Take 0.25-1 mg by mouth daily as needed. For restless leg     rosuvastatin (CRESTOR) 20 MG tablet Take 20 mg by mouth daily.     traMADol (ULTRAM) 50 MG tablet Take 50 mg by mouth every 6 (six) hours as needed for pain.       Assessment: 88 y.o. male with PE for heparin Goal of Therapy:  Heparin level 0.3-0.7 units/ml Monitor platelets by anticoagulation protocol: Yes   Plan:  Heparin 4000 units IV bolus, then start heparin 1000 units/hr Check heparin level in 6 hours.   Caryl Pina 11/13/2021,7:18 AM

## 2021-11-13 NOTE — TOC Initial Note (Signed)
Transition of Care [facility]) - Initial/Assessment Note    Patient Details  Name: Robert Beltran MRN: [id number] Date of Birth: 04-Mar-1924  Transition of Care [facility]) CM/SW Contact:    Benard Halsted, LCSW Phone Number: 11/13/2021, 5:10 PM  Clinical Narrative:                 CSW received consult for possible SNF placement at time of discharge. CSW spoke with patient and his son at bedside. Patient resides at [facility]. Patient expressed understanding of PT recommendation and is agreeable to SNF placement at time of discharge. Patient's son reports preference for [facility] as patient has been there before several years ago. CSW discussed insurance authorization process and will provide Medicare SNF ratings list. Patient has received COVID vaccines. CSW will send out referrals for review. Patient expressed being hopeful for rehab and to feel better soon. No further questions reported at this time.   Skilled Nursing Rehab Facilities-   [email or web address]   Ratings out of 5 possible   Name Address  Phone # [place] Inspection Overall  [facility] [address], [place] '4 5 2 3  '$ [facility]  [address], [place] [phone] '3 2 5 5  '$ [facility] [place], [place] '3 1 1 1  '$ [facility] [place], [place] '3 2 4 4  '$ [facility] [address] [place] '1 1 2 1  '$ [place] [place] '2 1 4 3  '$ [facility] [address], [place] '5 2 3 4  '$ [facility] [address], [place] '5 2 2 3  '$ [address] ([place]) [place], [place] [phone] '5 1 2 2  '$ [facility] [address], [place] [phone] '4 1 2 1  '$ [facility] [address] [facility] [phone] '4 1 2 1  '$ [facility]  ([place]) [place]. [place], [place] [phone] '4 1 1 1  '$ [facility]ock [address] [phone] '3 2 4 4          '$ [employer] [place] '4 2 3 3  '$ [facility] [place] [address], [place] '4 1 5 4  '$ [facility], [place] [address], [place] [phone] '2 1 1 1  '$ [facility] [address], [place] [phone] '2 1 3 2          '$ [place] (no [facility]) [place] [facility], [place] [phone] '4 5 5 5  '$ [facility] (No Humana) [address], [place] '3 1 4 3  '$ [facility] (No UHC) [place], [place] [address] [phone] '5 5 5 5  '$ [facility] [address] [facility] [phone] '3 2 4 4  '$ [facility] [place] [address] [place] '1 1 2 1  '$ [facility] [address] '2 1 1 1  '$ [facility], [place] '5 2 4 5  '$ [facility] [address] [place] '3 1 1 1  '$ [facility] [place], [place] '2 1 2 1          '$ [facility] [address], [place] '1 1 1 1  '$ [facility] [address]  [phone] '2 4 2 '$ 2  [facility] [place] [address], [place] [phone] '5 2 3 4  '$ [facility] [place] [address] [place] '2 1 1 1  '$ [place] (No Humana) [address] [phone] '2 1 3 2  '$ [facility] [address] [place] [phone] '3 1 1 1          '$ [facility] [place], [place] '5 4 5 5  '$ [facility])  [facility] [address], [place] '2 2 3 3  '$ [facility]) [place] [address] [phone] '3 2 4 4  '$ [facility] [address] [place] '4 3 4 4  '$ [address] [place], [place] '3 3 1 1  '$ [place] [facility]) [address] [place] [phone] '2 2 4 4     '$ Expected Discharge Plan: [place] Barriers to Discharge: Continued Medical Work up, [address], SNF Pending bed offer   Patient Goals and CMS Choice Patient states their goals for this hospitalization and ongoing recovery are:: Rehab CMS Medicare.gov Compare Post Acute Care list provided to:: Patient Choice offered to / list presented to : Patient, Adult Children  Expected Discharge Plan and Services Expected Discharge Plan: [place] In-house Referral: Clinical Social Work   Post Acute Care Choice: [place] Living arrangements for the past 2 months: [place]                                      Prior Living Arrangements/Services Living arrangements for the past 2 months: [place] Lives with:: Facility Resident Patient language and need for interpreter reviewed:: Yes Do you feel safe going back to the place where you live?: Yes      Need for Family Participation in Patient Care: Yes (Comment) Care giver support system in place?: Yes (comment)   Criminal Activity/Legal Involvement Pertinent to Current Situation/Hospitalization: No - Comment as needed  Activities of Daily Living Home Assistive Devices/Equipment: Oxygen, Eyeglasses ADL Screening (condition at time of admission) Patient's cognitive ability adequate to safely complete daily activities?: Yes Is the patient deaf or have difficulty hearing?: Yes (hearing aids at SNF) Does the patient have difficulty seeing, even when wearing glasses/contacts?: No Does the patient have difficulty concentrating, remembering, or making decisions?: No Patient able to express need for assistance with ADLs?: Yes Does the patient have difficulty dressing or bathing?: Yes Independently performs ADLs?: No Communication:  Needs assistance Is this a change from baseline?: Pre-admission baseline Dressing (OT): Needs assistance Is this a change from baseline?: Pre-admission baseline Grooming: Independent Feeding: Independent Bathing: Needs assistance Is this a change from baseline?: Pre-admission baseline Toileting: Needs assistance Is this a change from baseline?: Pre-admission baseline In/Out Bed: Needs assistance Is this a change from baseline?: Pre-admission baseline Walks in Home: Independent with device (comment) Does the patient have difficulty walking or climbing stairs?: Yes Weakness of Legs: Both Weakness of Arms/Hands: None  Permission Sought/Granted Permission sought to share information with : Facility [phone], Family Supports Permission granted to share information with : Yes, Verbal Permission Granted  Share Information with NAME: Robert Beltran.  Permission granted to share info w AGENCY: SNFs  Permission granted to share info w Relationship: Son  Permission granted to share info w Contact Information: [phone]  Emotional Assessment Appearance:: Appears stated age Attitude/Demeanor/Rapport:  Engaged Affect (typically observed): Accepting, Appropriate Orientation: : Oriented to Self, Oriented to Place, Oriented to  Time, Oriented to Situation Alcohol / Substance Use: Not Applicable Psych Involvement: No (comment)  Admission diagnosis:  Acute respiratory failure with hypoxia (HCC) [J96.01] Minor head injury, initial encounter [S09.90XA] Fall, initial encounter [W19.XXXA] Multifocal pneumonia [J18.9] Other acute pulmonary embolism, unspecified whether acute cor pulmonale present ([place]) [I26.99] Patient Active Problem List   Diagnosis Date Noted   Severe sepsis ([place]) 11/13/2021   Acute respiratory failure with hypoxia ([place]) 11/13/2021   Fall at home, initial encounter 11/13/2021   HLD (hyperlipidemia)    Hypertension    Chronic diastolic CHF (congestive heart failure)  ([place])    Multifocal pneumonia 11/12/2021   Severe obstructive sleep apnea-hypopnea syndrome 10/29/2019   Type 2 diabetes mellitus with stage 3a chronic kidney disease, without long-term current use of insulin ([place]) 09/17/2019   Excessive daytime sleepiness 09/17/2019   Snoring 09/17/2019   Muscle cramp, nocturnal 09/17/2019   Spinal stenosis of lumbar region with neurogenic claudication 09/17/2019   Nocturia more than twice per night 09/17/2019   Left bundle branch block 08/21/2012   PCP:  Crist Infante, MD Pharmacy:   [facility] - [address] [place] [address] Phone: [phone] Fax: [phone]  [place], [place] [address] Phone: [phone] Fax: [phone]     Social Determinants of Health (SDOH) Interventions    Readmission Risk Interventions     View : No data to display.

## 2021-11-13 NOTE — Progress Notes (Addendum)
  Transition of Care [facility]) Screening Note   Patient Details  Name: Robert Beltran Date of Birth: Oct 19, 1923   [facility]) CM/SW Contact:    Benard Halsted, LCSW Phone Number: 11/13/2021, 9:23 AM    Transition of Care Department [facility]) has reviewed patient. Patient resides at [place]. We will continue to monitor patient advancement through interdisciplinary progression rounds. If new patient transition needs arise, please place a TOC consult.

## 2021-11-13 NOTE — Assessment & Plan Note (Signed)
#)  Severe sepsis due to multifocal pneumonia: Diagnosis on the basis of 3 to 4 days of new onset cough, evidence of increased work of breathing, and new oxygen requirement, and preliminary report regarding CTA chest showing evidence of multifocal pneumonia, with concern for potential aspiration given report that the patient vomited while supine in the CT scanner.   SIRS criteria met via presenting leukocytosis as well as tachycardia and tachypnea. Lactic acid level: Initially 2.3, with repeat value trending up slightly to 2.6. Of note, given the associated presence of suspected end organ damage in the form of concominant presenting acute hypoxic respiratory failure as well as elevated lactate, criteria are met for pt's sepsis to be considered severe in nature. However, in the absence of lactic acid level that is greater than or equal to 4.0, and in the absence of any associated hypotension refractory to IVF's, there are no indications for administration of a 30 mL/kg IVF bolus at this time.  Of note, in the setting of the patient's age as well as history of chronic diastolic heart failure, will initiate brief course of gentle IV fluids, with close monitoring for result of repeat lactate.  Additional ED work-up/management notable for: Collection of blood cultures x2 followed by initiation of Zosyn, which will be continued for its inclusion of anaerobic coverage given suspicion for an element of aspiration.  No e/o additional infectious process at this time, including urinalysis that was not consistent with UTI.    Plan: CBC w/ diff and CMP in AM.  Follow for results of blood cx's x 2. Abx: Continue Zosyn, as above.  Lactated Ringer's at 75 cc/h x 4 hours, with repeat lactate ordered.  Add on procalcitonin level.  Aspiration precautions ordered.  Flutter valve, incentive spirometry.  

## 2021-11-13 NOTE — NC FL2 (Signed)
MEDICAID FL2 LEVEL OF CARE SCREENING TOOL     IDENTIFICATION  Patient Name: Robert Beltran Birthdate: 1924-05-04 Sex: male Admission Date (Current Location): 11/12/2021  [facility] [place] Number:  Herbalist and Address:  [facility] [place] [address] [place], [place] [address]      Provider Number: [id number]  Attending Physician Name and Address:  Jonetta Osgood, MD  Relative Name and Phone Number:       Current Level of Care: Hospital Recommended Level of Care: [place] Prior Approval Number:    Date Approved/Denied:   PASRR Number: [id number]  Discharge Plan: SNF    Current Diagnoses: Patient Active Problem List   Diagnosis Date Noted   Severe sepsis ([place]) 11/13/2021   Acute respiratory failure with hypoxia ([place]) 11/13/2021   Fall at home, initial encounter 11/13/2021   HLD (hyperlipidemia)    Hypertension    Chronic diastolic CHF (congestive heart failure) ([place])    Multifocal pneumonia 11/12/2021   Severe obstructive sleep apnea-hypopnea syndrome 10/29/2019   Type 2 diabetes mellitus with stage 3a chronic kidney disease, without long-term current use of insulin ([place]) 09/17/2019   Excessive daytime sleepiness 09/17/2019   Snoring 09/17/2019   Muscle cramp, nocturnal 09/17/2019   Spinal stenosis of lumbar region with neurogenic claudication 09/17/2019   Nocturia more than twice per night 09/17/2019   Left bundle branch block 08/21/2012    Orientation RESPIRATION BLADDER Height & Weight     Self, Time, Situation, Place  O2 (Nasal cannula 4L) Incontinent, External catheter Weight: 157 lb 10.1 oz (71.5 kg) Height:  '5\' 2"'$  (157.5 cm)  BEHAVIORAL SYMPTOMS/MOOD NEUROLOGICAL BOWEL NUTRITION STATUS      Incontinent Diet (See dc summary)  AMBULATORY STATUS COMMUNICATION OF NEEDS Skin   Limited Assist Verbally Other (Comment) (wound on face, non pressure wound on knee)                        Personal Care Assistance Level of Assistance  Bathing, Feeding, Dressing Bathing Assistance: Maximum assistance Feeding assistance: Independent Dressing Assistance: Limited assistance     Functional Limitations Info  Sight, Hearing Sight Info: Impaired Hearing Info: Impaired      SPECIAL CARE FACTORS FREQUENCY  PT (By licensed PT), OT (By licensed OT), Speech therapy     PT Frequency: 5x/week OT Frequency: 5x/week     Speech Therapy Frequency: 2x/week      Contractures Contractures Info: Not present    Additional Factors Info  Code Status, Allergies, Insulin Sliding Scale, Psychotropic Code Status Info: Full Allergies Info: Aspirin, Nsaids, Other, Bactrim (Sulfamethoxazole-trimethoprim) Psychotropic Info: Lexapro Insulin Sliding Scale Info: see dc summary       Current Medications (11/13/2021):  This is the current hospital active medication list Current Facility-Administered Medications  Medication Dose Route Frequency Provider Last Rate Last Admin   acetaminophen (TYLENOL) tablet 650 mg  650 mg Oral Q6H PRN Howerter, Justin B, DO       Or   acetaminophen (TYLENOL) suppository 650 mg  650 mg Rectal Q6H PRN Howerter, Justin B, DO       Ampicillin-Sulbactam (UNASYN) 3 g in sodium chloride 0.9 % 100 mL IVPB  3 g Intravenous Q8H Pierce, Dwayne A, RPH 200 mL/hr at 11/13/21 1249 3 g at 11/13/21 1249   [START ON 11/14/2021] enoxaparin (LOVENOX) injection 70 mg  70 mg Subcutaneous BID Pham, Minh Q, RPH-CPP  escitalopram (LEXAPRO) tablet 5 mg  5 mg Oral Daily Howerter, Justin B, DO   5 mg at 11/13/21 0928   insulin aspart (novoLOG) injection 0-6 Units  0-6 Units Subcutaneous TID WC Howerter, Justin B, DO       levothyroxine (SYNTHROID) tablet 75 mcg  75 mcg Oral QAC breakfast Howerter, Justin B, DO   75 mcg at 11/13/21 1157   rosuvastatin (CRESTOR) tablet 20 mg  20 mg Oral Daily Howerter, Justin B, DO   20 mg at 11/13/21 2620     Discharge Medications: Please see  discharge summary for a list of discharge medications.  Relevant Imaging Results:  Relevant Lab Results:   Additional Information SSN: [id number]. Moderna vaccines 07/16/19, 08/13/19, 11/08/20  Benard Halsted, LCSW

## 2021-11-13 NOTE — Evaluation (Signed)
Physical Therapy Evaluation Patient Details Name: Robert Beltran MRN: [id number] DOB: 11/12/1923 Today's Date: 11/13/2021  History of Present Illness  Pt is a 88 y.o. male admitted 5/18 from [facility] with dx of severe sepsis due to multifocal pneumonia. He presented to the ED following a fall, hitting his head. CT negative. PMH:  type 2 diabetes mellitus, essential hypertension, hyperlipidemia   Clinical Impression  Pt admitted with above diagnosis. PTA pt resided at [facility], unsure if ILF or ALF. He ambulated with RW. Pt currently with functional limitations due to the deficits listed below (see PT Problem List). On eval, pt required min/mod assist bed mobility, mod assist sit to stand, and mod assist sidestepping with RW bedside 2 steps. Pt on 6L via [place]. SpO2 87-91%. Increased WOB and DOE noted with all mobility. Pt will benefit from skilled PT to increase their independence and safety with mobility to allow discharge to the venue listed below.          Recommendations for follow up therapy are one component of a multi-disciplinary discharge planning process, led by the attending physician.  Recommendations may be updated based on patient status, additional functional criteria and insurance authorization.  Follow Up Recommendations Skilled nursing-short term rehab (<3 hours/day)    Assistance Recommended at Discharge Frequent or constant Supervision/Assistance  Patient can return home with the following  A lot of help with walking and/or transfers;A lot of help with bathing/dressing/bathroom;Assist for transportation;Direct supervision/assist for medications management;Assistance with cooking/housework    Equipment Recommendations None recommended by PT  Recommendations for Other Services       Functional Status Assessment Patient has had a recent decline in their functional status and demonstrates the ability to make significant improvements in function in a reasonable and  predictable amount of time.     Precautions / Restrictions Precautions Precautions: Fall      Mobility  Bed Mobility Overal bed mobility: Needs Assistance Bed Mobility: Supine to Sit, Sit to Supine     Supine to sit: Mod assist, HOB elevated Sit to supine: Min assist   General bed mobility comments: +rail, increased time, assist to elevate trunk and scoot to EOB, assist with BLE back to bed    Transfers Overall transfer level: Needs assistance Equipment used: Rolling walker (2 wheels) Transfers: Sit to/from Stand Sit to Stand: Mod assist           General transfer comment: assist to power up and stabilize balance, increased time, pt maintaining flexed posture    Ambulation/Gait Ambulation/Gait assistance: Mod assist Gait Distance (Feet): 2 Feet Assistive device: Rolling walker (2 wheels)         General Gait Details: sidestepping bedside with RW, assist to manage RW and maintain balance  Stairs            Wheelchair Mobility    Modified Rankin (Stroke Patients Only)       Balance Overall balance assessment: Needs assistance Sitting-balance support: Feet supported, Bilateral upper extremity supported Sitting balance-Leahy Scale: Fair     Standing balance support: Bilateral upper extremity supported, During functional activity, Reliant on assistive device for balance Standing balance-Leahy Scale: Poor                               Pertinent Vitals/Pain Pain Assessment Pain Assessment: No/denies pain    Home Living Family/patient expects to be discharged to:: Assisted living  Home Equipment: Conservation officer, nature (2 wheels);Wheelchair - Brewing technologist - built in Additional Comments: [employer]    Prior Function Prior Level of Function : Independent/Modified Independent             Mobility Comments: Pt reports amb to/from dining room with RW. Unsure if he is a good historian. Cognition appears  intact.       Hand Dominance   Dominant Hand: Right    Extremity/Trunk Assessment   Upper Extremity Assessment Upper Extremity Assessment: Defer to OT evaluation    Lower Extremity Assessment Lower Extremity Assessment: Generalized weakness    Cervical / Trunk Assessment Cervical / Trunk Assessment: Kyphotic  Communication   Communication: HOH  Cognition Arousal/Alertness: Awake/alert Behavior During Therapy: WFL for tasks assessed/performed Overall Cognitive Status: Difficult to assess                                 General Comments: Cognition appears intact. Appropriate. Following commands.        General Comments General comments (skin integrity, edema, etc.): Pt on 6L continuous O2 via [place]. SpO2 87-91%. Increased WOB, DOE noted with all mobility.    Exercises     Assessment/Plan    PT Assessment Patient needs continued PT services  PT Problem List Decreased strength;Decreased mobility;Decreased activity tolerance;Decreased balance;Cardiopulmonary status limiting activity       PT Treatment Interventions Therapeutic activities;Gait training;Therapeutic exercise;Patient/family education;Balance training;Functional mobility training    PT Goals (Current goals can be found in the Care Plan section)  Acute Rehab PT Goals Patient Stated Goal: back to Robert Beltran PT Goal Formulation: With patient Time For Goal Achievement: 11/27/21 Potential to Achieve Goals: Fair    Frequency Min 2X/week     Co-evaluation               AM-PAC PT "6 Clicks" Mobility  Outcome Measure Help needed turning from your back to your side while in a flat bed without using bedrails?: A Little Help needed moving from lying on your back to sitting on the side of a flat bed without using bedrails?: A Lot Help needed moving to and from a bed to a chair (including a wheelchair)?: A Lot Help needed standing up from a chair using your arms (e.g., wheelchair or  bedside chair)?: A Lot Help needed to walk in hospital room?: Total Help needed climbing 3-5 steps with a railing? : Total 6 Click Score: 11    End of Session Equipment Utilized During Treatment: Gait belt;Oxygen Activity Tolerance: Patient tolerated treatment well Patient left: in bed;with call bell/phone within reach;with bed alarm set Nurse Communication: Mobility status PT Visit Diagnosis: Other abnormalities of gait and mobility (R26.89);Muscle weakness (generalized) (M62.81)    Time: 0272-5366 PT Time Calculation (min) (ACUTE ONLY): 18 min   Charges:   PT Evaluation $PT Eval Moderate Complexity: 1 Mod          Lorrin Goodell, PT  Office # [phone] Pager [phone]   Lorriane Shire 11/13/2021, 1:08 PM

## 2021-11-13 NOTE — Assessment & Plan Note (Signed)
 #)   Hyperlipidemia: documented h/o such. On high intensity rosuvastatin as outpatient.    Plan: continue home statin.    

## 2021-11-13 NOTE — Progress Notes (Signed)
Pharmacy Antibiotic Note  Robert Beltran is a 88 y.o. male admitted on 11/12/2021 with aspiration PNA  Pharmacy has been consulted for Unasyn dosing. CrCl ~45 ml/min  Plan: Unasyn 3gm IV q8h D/c zosyn Monitor for clinical course and deescalation  Weight: 71.5 kg (157 lb 10.1 oz)  Temp (24hrs), Avg:98.5 F (36.9 C), Min:97.8 F (36.6 C), Max:99.5 F (37.5 C)  Recent Labs  Lab 11/12/21 2055 11/12/21 2110 11/12/21 2357 11/13/21 0134 11/13/21 0411 11/13/21 0748  WBC  --  17.0*  --   --  16.1*  --   CREATININE 1.04  --   --   --  1.05  --   LATICACIDVEN  --   --  2.3* 2.6*  --  2.7*    CrCl cannot be calculated (Unknown ideal weight.).    Allergies  Allergen Reactions   Aspirin Other (See Comments)    GI bleed   Nsaids Other (See Comments)    GI distress   Other Nausea Only    Coricidin   Bactrim [Sulfamethoxazole-Trimethoprim] Rash    Antimicrobials this admission: 5/18 zosyn >> 5/19 5/19 Unasyn >>   Dose adjustments this admission: N/a  Microbiology results: 5/19 BCx: NGTD  5/19 MRSA PCR: ordered  Robert Beltran A. Levada Dy, PharmD, BCPS, FNKF Clinical Pharmacist [place] Please utilize Amion for appropriate phone number to reach the unit pharmacist ([place])  11/13/2021 12:00 PM

## 2021-11-13 NOTE — Progress Notes (Addendum)
PROGRESS NOTE        PATIENT DETAILS Name: Robert Beltran Age: 88 y.o. Sex: male Date of Birth: 29-Nov-1923 Admit Date: 11/12/2021 Admitting Physician Rhetta Mura, DO KPT:[id number], Elta [place], MD  Brief Summary: Patient is a 88 y.o.  male with DM-2, HTN, HLD-who presented from his assisted living facility for evaluation of a cough/exertional dyspnea and a mechanical fall-upon further evaluation-he was found to have sepsis due to multifocal pneumonia and pulmonary embolism.    Significant events: 5/18>> admit to [facility]-sepsis for PNA-small PE  Significant studies: 5/18>> CXR: Bibasilar atelectasis 5/18>> x-ray pelvis: No fracture 5/18>> x-ray right knee: No fracture/dislocation/joint effusion. 5/18>> x-ray left knee: No fracture/dislocation/joint effusion 5/18>> CT head: No acute intracranial process. 5/18>> CT C-spine: No fracture/dislocation-multilevel spondylosis/facet hypertrophy. 5/18>> CTA chest: Isolated subsegmental right upper lobe pulmonary emboli-multifocal PNA.  Significant microbiology data: 5/18>> COVID/influenza PCR: Negative 5/18>> blood culture: No growth  Procedures: None  Consults: None   Subjective: Lying comfortably in bed-denies any chest pain or shortness of breath.  Very hard of hearing.  Objective: Vitals: Blood pressure (!) 110/44, pulse 76, temperature 97.8 F (36.6 C), temperature source Axillary, resp. rate 20, weight 71.5 kg, SpO2 (!) 89 %.   Exam: Gen Exam:Alert awake-not in any distress HEENT:atraumatic, normocephalic Chest: B/L clear to auscultation anteriorly CVS:S1S2 regular Abdomen:soft non tender, non distended Extremities:no edema Neurology: Non focal Skin: no rash  Pertinent Labs/Radiology:    Latest Ref Rng & Units 11/13/2021    4:11 AM 11/12/2021    9:10 PM 08/29/2012    7:45 AM  CBC  WBC 4.0 - 10.5 K/uL 16.1   17.0     Hemoglobin 13.0 - 17.0 g/dL 14.0   14.3   16.7    Hematocrit 39.0 - 52.0 % 43.1    43.5   49.0    Platelets 150 - 400 K/uL 163   144       Lab Results  Component Value Date   NA 139 11/13/2021   K 4.9 11/13/2021   CL 102 11/13/2021   CO2 30 11/13/2021      Assessment/Plan: Severe sepsis due to multifocal pneumonia: Likely aspiration PNA-sepsis physiology improving-switch to Unasyn-follow cultures.  Await SLP eval.  Acute hypoxic respiratory failure due to a combination of PNA and PE: Comfortable-on 3-4 L of oxygen this morning-on antibiotics/anticoagulation-titrate down FiO2 as tolerated  Pulmonary embolism: Suspect provoked by sedentary lifestyle/advanced age-checking lower extremity Dopplers-starting IV heparin-if tolerates-can be switched to oral agent in the next few days.  Chronic HFpEF: Euvolemic  HTN: BP stable-amlodipine/chlorthalidone/ramipril on hold.  HLD: Continue statin  DM-2 (A1c 6.3 on 5/19): Stable on SSI-resume metformin on discharge.  CKD stage IIIa: Close to baseline-follow electrolytes periodically  Mechanical fall: Imaging negative for fractures-see above-obtaining PT/OT.  Palliative care: Advanced age-frail-probable aspiration pneumonia PE-very hard of hearing-unable to get in touch with patient's son-we will get palliative care evaluation for advanced directive/goals of care discussion.  Addendum-received callback from patient's son Kartik-request that patient be kept full code-I have recommended DNR-he will discuss with the patient and other family members.  He is aware of palliative care consultation.  BMI: Estimated body mass index is 28.83 kg/m as calculated from the following:   Height as of 02/21/20: '5\' 2"'$  (1.575 m).   Weight as of this encounter: 71.5 kg.   Code status:   Code  Status: Full Code   DVT Prophylaxis: SCDs Start: 11/12/21 2352 IV heparin  Family Communication: Son Arlen Misch-[phone]-on 5/19   Disposition Plan: Status is: Inpatient Remains inpatient appropriate because: PNA/PE-on IV  antibiotics-anticoagulation-still on oxygen-not yet stable for discharge.   Planned Discharge Destination:Skilled nursing facility   Diet: Diet Order             Diet regular Room service appropriate? Yes; Fluid consistency: Thin  Diet effective now                     Antimicrobial agents: Anti-infectives (From admission, onward)    Start     Dose/Rate Route Frequency Ordered Stop   11/13/21 0600  piperacillin-tazobactam (ZOSYN) IVPB 3.375 g        3.375 g 12.5 mL/hr over 240 Minutes Intravenous Every 8 hours 11/13/21 0009     11/12/21 2345  piperacillin-tazobactam (ZOSYN) IVPB 3.375 g        3.375 g 100 mL/hr over 30 Minutes Intravenous  Once 11/12/21 2332 11/13/21 0121        MEDICATIONS: Scheduled Meds:  escitalopram  5 mg Oral Daily   insulin aspart  0-6 Units Subcutaneous TID WC   levothyroxine  75 mcg Oral QAC breakfast   rosuvastatin  20 mg Oral Daily   Continuous Infusions:  heparin 1,000 Units/hr (11/13/21 0939)   piperacillin-tazobactam (ZOSYN)  IV 3.375 g (11/13/21 0618)   PRN Meds:.acetaminophen **OR** acetaminophen   I have personally reviewed following labs and imaging studies  LABORATORY DATA: CBC: Recent Labs  Lab 11/12/21 2110 11/13/21 0411  WBC 17.0* 16.1*  NEUTROABS 16.0* 15.2*  HGB 14.3 14.0  HCT 43.5 43.1  MCV 103.6* 101.2*  PLT 144* 595    Basic Metabolic Panel: Recent Labs  Lab 11/12/21 2055 11/12/21 2218 11/13/21 0411  NA 136  --  139  K 5.1  --  4.9  CL 103  --  102  CO2 22  --  30  GLUCOSE 239*  --  170*  BUN 17  --  18  CREATININE 1.04  --  1.05  CALCIUM 8.6*  --  8.5*  MG  --  1.8 1.8  PHOS  --   --  3.4    GFR: CrCl cannot be calculated (Unknown ideal weight.).  Liver Function Tests: Recent Labs  Lab 11/13/21 0411  AST 21  ALT 16  ALKPHOS 55  BILITOT 0.7  PROT 5.4*  ALBUMIN 2.8*   No results for input(s): LIPASE, AMYLASE in the last 168 hours. No results for input(s): AMMONIA in the last 168  hours.  Coagulation Profile: No results for input(s): INR, PROTIME in the last 168 hours.  Cardiac Enzymes: No results for input(s): CKTOTAL, CKMB, CKMBINDEX, TROPONINI in the last 168 hours.  BNP (last 3 results) No results for input(s): PROBNP in the last 8760 hours.  Lipid Profile: No results for input(s): CHOL, HDL, LDLCALC, TRIG, CHOLHDL, LDLDIRECT in the last 72 hours.  Thyroid Function Tests: No results for input(s): TSH, T4TOTAL, FREET4, T3FREE, THYROIDAB in the last 72 hours.  Anemia Panel: No results for input(s): VITAMINB12, FOLATE, FERRITIN, TIBC, IRON, RETICCTPCT in the last 72 hours.  Urine analysis:    Component Value Date/Time   COLORURINE YELLOW 11/12/2021 2359   APPEARANCEUR CLEAR 11/12/2021 2359   LABSPEC 1.016 11/12/2021 2359   PHURINE 7.0 11/12/2021 2359   GLUCOSEU 50 (A) 11/12/2021 [place] NEGATIVE 11/12/2021 [place] NEGATIVE 11/12/2021 2359  KETONESUR NEGATIVE 11/12/2021 2359   PROTEINUR NEGATIVE 11/12/2021 2359   NITRITE NEGATIVE 11/12/2021 2359   LEUKOCYTESUR NEGATIVE 11/12/2021 2359    Sepsis Labs: Lactic Acid, Venous    Component Value Date/Time   LATICACIDVEN 2.7 (HH) 11/13/2021 0748    MICROBIOLOGY: Recent Results (from the past 240 hour(s))  Resp Panel by RT-PCR (Flu A&B, Covid) Nasopharyngeal Swab     Status: None   Collection Time: 11/12/21  7:03 PM   Specimen: Nasopharyngeal Swab; Nasopharyngeal(NP) swabs in vial transport medium  Result Value Ref Range Status   SARS Coronavirus 2 by RT PCR NEGATIVE NEGATIVE Final    Comment: (NOTE) SARS-CoV-2 target nucleic acids are NOT DETECTED.  The SARS-CoV-2 RNA is generally detectable in upper respiratory specimens during the acute phase of infection. The lowest concentration of SARS-CoV-2 viral copies this assay can detect is 138 copies/mL. A negative result does not preclude SARS-Cov-2 infection and should not be used as the sole basis for treatment or other patient  management decisions. A negative result may occur with  improper specimen collection/handling, submission of specimen other than nasopharyngeal swab, presence of viral mutation(s) within the areas targeted by this assay, and inadequate number of viral copies(<138 copies/mL). A negative result must be combined with clinical observations, patient history, and epidemiological information. The expected result is Negative.  Fact Sheet for Patients:  EntrepreneurPulse.com.au  Fact Sheet for Healthcare Providers:  IncredibleEmployment.be  This test is no t yet approved or cleared by the [place] FDA and  has been authorized for detection and/or diagnosis of SARS-CoV-2 by FDA under an Emergency Use Authorization (EUA). This EUA will remain  in effect (meaning this test can be used) for the duration of the COVID-19 declaration under Section 564(b)(1) of the Act, 21 U.S.C.section 360bbb-3(b)(1), unless the authorization is terminated  or revoked sooner.       Influenza A by PCR NEGATIVE NEGATIVE Final   Influenza B by PCR NEGATIVE NEGATIVE Final    Comment: (NOTE) The Xpert Xpress SARS-CoV-2/FLU/RSV plus assay is intended as an aid in the diagnosis of influenza from Nasopharyngeal swab specimens and should not be used as a sole basis for treatment. Nasal washings and aspirates are unacceptable for Xpert Xpress SARS-CoV-2/FLU/RSV testing.  Fact Sheet for Patients: EntrepreneurPulse.com.au  Fact Sheet for Healthcare Providers: IncredibleEmployment.be  This test is not yet approved or cleared by the [place] FDA and has been authorized for detection and/or diagnosis of SARS-CoV-2 by FDA under an Emergency Use Authorization (EUA). This EUA will remain in effect (meaning this test can be used) for the duration of the COVID-19 declaration under Section 564(b)(1) of the Act, 21 U.S.C. section 360bbb-3(b)(1),  unless the authorization is terminated or revoked.  Performed at [facility], [place] [address], [place], [place] [address]   Culture, blood (routine x 2)     Status: None (Preliminary result)   Collection Time: 11/12/21 11:57 PM   Specimen: BLOOD  Result Value Ref Range Status   Specimen Description BLOOD RIGHT ANTECUBITAL  Final   Special Requests   Final    BOTTLES DRAWN AEROBIC AND ANAEROBIC Blood Culture adequate volume   Culture   Final    NO GROWTH < 12 HOURS Performed at [facility], [place] [address], [place],  [address]    Report Status PENDING  Incomplete  Culture, blood (routine x 2)     Status: None (Preliminary result)   Collection Time: 11/13/21 12:07 AM   Specimen: BLOOD  LEFT HAND  Result Value Ref Range Status   Specimen Description BLOOD LEFT HAND  Final   Special Requests   Final    BOTTLES DRAWN AEROBIC AND ANAEROBIC Blood Culture adequate volume   Culture   Final    NO GROWTH < 12 HOURS Performed at [facility], [address], [place], [place] [address]    Report Status PENDING  Incomplete    RADIOLOGY STUDIES/RESULTS: DG Chest 2 View  Result Date: 11/12/2021 CLINICAL DATA:  Fall EXAM: CHEST - 2 VIEW COMPARISON:  08/29/2012 FINDINGS: Shallow lung inflation with bibasilar atelectasis. Mild cardiomegaly. Normal pleural spaces. IMPRESSION: Shallow lung inflation with bibasilar atelectasis. Electronically Signed   By: Ulyses Jarred M.D.   On: 11/12/2021 19:44   DG Pelvis 1-2 Views  Result Date: 11/12/2021 CLINICAL DATA:  Fall EXAM: PELVIS - 1-2 VIEW COMPARISON:  None Available. FINDINGS: There is no evidence of pelvic fracture or diastasis. No pelvic bone lesions are seen. Dense stool in the colon. IMPRESSION: Negative. Electronically Signed   By: Ulyses Jarred M.D.   On: 11/12/2021 19:52   CT Head Wo Contrast  Result Date: 11/12/2021 CLINICAL DATA:  [place], hit head EXAM: CT HEAD WITHOUT CONTRAST TECHNIQUE: Contiguous axial images were  obtained from the base of the skull through the vertex without intravenous contrast. RADIATION DOSE REDUCTION: This exam was performed according to the departmental dose-optimization program which includes automated exposure control, adjustment of the mA and/or kV according to patient size and/or use of iterative reconstruction technique. COMPARISON:  10/30/2015 FINDINGS: Brain: Chronic small vessel ischemic changes are seen throughout the periventricular white matter and bilateral basal ganglia, progressive since prior exam. No evidence of acute infarct or hemorrhage. Lateral ventricles and midline structures are unremarkable. No acute extra-axial fluid collections. No mass effect. Vascular: Diffuse atherosclerosis of the internal carotid arteries. No hyperdense vessel. Skull: Normal. Negative for fracture or focal lesion. Sinuses/Orbits: No acute finding. Other: None. IMPRESSION: 1. No acute intracranial process. 2. Chronic small-vessel ischemic changes throughout the periventricular white matter and bilateral basal ganglia. Electronically Signed   By: Randa Ngo M.D.   On: 11/12/2021 20:01   CT Angio Chest PE W and/or Wo Contrast  Result Date: 11/12/2021 CLINICAL DATA:  Fall, evaluate for PE EXAM: CT ANGIOGRAPHY CHEST WITH CONTRAST TECHNIQUE: Multidetector CT imaging of the chest was performed using the standard protocol during bolus administration of intravenous contrast. Multiplanar CT image reconstructions and MIPs were obtained to evaluate the vascular anatomy. RADIATION DOSE REDUCTION: This exam was performed according to the departmental dose-optimization program which includes automated exposure control, adjustment of the mA and/or kV according to patient size and/or use of iterative reconstruction technique. CONTRAST:  34m OMNIPAQUE IOHEXOL 350 MG/ML SOLN COMPARISON:  Chest radiograph dated 11/12/2021 FINDINGS: Cardiovascular: Satisfactory opacification of the bilateral pulmonary arteries to the  segmental level. Evaluation is mildly constrained by respiratory motion. Within that constraint, there is an isolated subsegmental right upper lobe pulmonary embolism (series 7/image 124). Overall clot burden is very small. Study is not tailored for evaluation of the thoracic aorta. Atherosclerotic calcifications of the arch. Heart is normal in size.  No pericardial effusion. Three vessel coronary atherosclerosis. Mediastinum/Nodes: Small mediastinal lymph nodes, likely reactive. Visualized thyroid is unremarkable. Lungs/Pleura: Multifocal patchy opacities in the lungs bilaterally, lower lobe predominant, suspicious for pneumonia. Superimposed bilateral lower lobe atelectasis is possible. Evaluation lung parenchyma is constrained by respiratory motion. Within that constraint, there are no suspicious pulmonary nodules. No pleural effusion or  pneumothorax. Upper Abdomen: Visualized upper abdomen is grossly unremarkable, noting vascular calcifications. Musculoskeletal: Degenerative changes at T12-L1. No rib fracture is seen. Degenerative changes of the bilateral shoulders, right greater than left. Review of the MIP images confirms the above findings. IMPRESSION: Isolated subsegmental right upper lobe pulmonary embolism. Overall clot burden is very small. Multifocal pneumonia, lower lobe predominant. Critical Value/emergent results were called by telephone at the time of interpretation on 11/12/2021 at 11:43 pm to provider JULIE HAVILAND , who verbally acknowledged these results. Aortic Atherosclerosis (ICD10-I70.0). Electronically Signed   By: Julian Hy M.D.   On: 11/12/2021 23:46   CT Cervical Spine Wo Contrast  Result Date: 11/12/2021 CLINICAL DATA:  [place], hit head EXAM: CT CERVICAL SPINE WITHOUT CONTRAST TECHNIQUE: Multidetector CT imaging of the cervical spine was performed without intravenous contrast. Multiplanar CT image reconstructions were also generated. RADIATION DOSE REDUCTION: This exam was  performed according to the departmental dose-optimization program which includes automated exposure control, adjustment of the mA and/or kV according to patient size and/or use of iterative reconstruction technique. COMPARISON:  None Available. FINDINGS: Alignment: Alignment is grossly anatomic. Skull base and vertebrae: No acute fracture. No primary bone lesion or focal pathologic process. Soft tissues and spinal canal: No prevertebral fluid or swelling. No visible canal hematoma. Disc levels: There is severe multilevel spondylosis and facet hypertrophy. There is bony fusion across the C3-4 disc space. Severe disc space narrowing and osteophyte formation are seen at C4-5, C5-6, and C6-7. Diffuse facet hypertrophic changes greatest at C2-3 and C3-4. Upper chest: Airway is patent. Lung apices are clear. Incidental note is made of fluid-filled upper thoracic esophagus which could reflect reflux. Other: Reconstructed images demonstrate no additional findings. IMPRESSION: 1. No acute cervical spine fracture. 2. Severe multilevel spondylosis and facet hypertrophy as above. Electronically Signed   By: Randa Ngo M.D.   On: 11/12/2021 20:04   DG Knee Complete 4 Views Left  Result Date: 11/12/2021 CLINICAL DATA:  Fall EXAM: LEFT KNEE - COMPLETE 4+ VIEW COMPARISON:  None Available. FINDINGS: No evidence of fracture, dislocation, or joint effusion. No evidence of arthropathy or other focal bone abnormality. Soft tissues are unremarkable. IMPRESSION: Negative. Electronically Signed   By: Ulyses Jarred M.D.   On: 11/12/2021 19:49   DG Knee Complete 4 Views Right  Result Date: 11/12/2021 CLINICAL DATA:  Fall EXAM: RIGHT KNEE - COMPLETE 4+ VIEW COMPARISON:  None Available. FINDINGS: No evidence of fracture, dislocation, or joint effusion. No evidence of arthropathy or other focal bone abnormality. Soft tissues are unremarkable. IMPRESSION: Negative. Electronically Signed   By: Ulyses Jarred M.D.   On: 11/12/2021 19:50      LOS: 1 day   Oren Binet, MD  [facility]    To contact the attending provider between 7A-7P or the covering provider during after hours 7P-7A, please log into the web site [email or web address] and access using universal [place] password for that web site. If you do not have the password, please call the hospital operator.  11/13/2021, 10:59 AM

## 2021-11-13 NOTE — Progress Notes (Signed)
CSW attempted to see patient regarding SNF consult however, patient out of room. Will attempt again at a later time.   Gilmore Laroche, MSW, [facility]

## 2021-11-13 NOTE — Assessment & Plan Note (Signed)
 #(  Ground-level mechanical fall: Appears to have experienced a mechanical ground-level fall while ambulating at his SNF earlier today, without any associated loss of consciousness, although he did hit his head as a component of this fall.  Not on any blood thinners.  Ensuing CT head showed no evidence of acute intracranial process, including no evidence of intracranial hemorrhage, while CT cervical spine showed no evidence of cervical subluxation injury.  Suspect that the patient was predisposed, mechanical fall as a consequence of his presenting severe sepsis due to multifocal pneumonia without evidence of additional underlying infectious process, including urinalysis that was not suggestive of UTI.  No evidence of acute focal neurologic deficits to suggest acute ischemic CVA.  Plan: Fall precautions ordered.  Further evaluation management of severe sepsis due to multifocal pneumonia, as above.  Repeat CMP and CBC in the morning.

## 2021-11-13 NOTE — Progress Notes (Signed)
Modified Barium Swallow Progress Note  Patient Details  Name: Robert Beltran MRN: [id number] Date of Birth: [birth year]  Today's Date: 11/13/2021  Modified Barium Swallow completed.  Full report located under Chart Review in the Imaging Section.  Brief recommendations include the following:  Clinical Impression  Cervical osteophytes noted C4-6 with moderate anterior protrusion into the pharynx. Pt's overall pharyngeal swallow appears worse compared to the study on 06/04/21. Pt presents with pharyngeal dsyphagia characterized by a phrayngeal delay, as well as reduction in tongue base retraction, pharyngeal constriction, and anterior laryngeal movement.  He demonstrated vallecular residue, posterior pharyngeal wall residue, and pyriform sinus residue. With large boluses of thin liquids, pt appeared to have difficulty maintaining laryngeal closure, and generating enough pressure to facilitate transit of these boluses past the point of the cervical osteophytes. Penetration (PAS 3, 5) and aspiration (PAS 7, 8) were therefore noted with large boluses of thin liquids via straw and with consecutive swallows of thin liquids via cup. Throat clearing and a slight delayed cough were inconsistently noted following apsiration. Prompted coughing was ineffective in expelling aspirate, but did mobilize material within the larynx. No functional benefit was noted with a chin tuck posture which increased laryngeal invasion and episodes of aspiration. Pt independently reduced bolus sizes with nectar thick liquids via cup and no laryngeal invasion was noted therewith. Penetration and aspiration were eliminated with individual, smaller boluses of thin liquids via cup. Secondary swallows were effective in reducing pharyngeal residue to a functional level. Pt's current diet of regular texture solids and thin liquids will be continued with strict observance of swallowing precautions. SLP will follow to ensure tolerance for dysphagia  treatment.   Swallow Evaluation Recommendations       SLP Diet Recommendations: Regular solids;Thin liquid   Liquid Administration via: Cup;No straw   Medication Administration: Whole meds with puree   Supervision: Staff to assist with self feeding;Full supervision/cueing for compensatory strategies   Compensations: Slow rate;Small sips/bites;Multiple dry swallows after each bite/sip (avoid consecutive swallows)   Postural Changes: Seated upright at 90 degrees   Oral Care Recommendations: Oral care BID     Jarrett Albor I. Hardin Negus, [place], [place] Office number [phone] Pager [place] 11/13/2021,4:28 PM

## 2021-11-13 NOTE — Evaluation (Signed)
Clinical/Bedside Swallow Evaluation Patient Details  Name: Robert Beltran MRN: [id number] Date of Birth: [birth year]  Today's Date: 11/13/2021 Time: SLP Start Time (ACUTE ONLY): 67 SLP Stop Time (ACUTE ONLY): 1055 SLP Time Calculation (min) (ACUTE ONLY): 15 min  Past Medical History:  Past Medical History:  Diagnosis Date   Arthritis    Benign prostate hyperplasia    Cancer ([place])    testicular cancer 1964   Chronic back pain    Depression    Diabetes mellitus without complication (HCC)    DJD (degenerative joint disease)    GERD (gastroesophageal reflux disease)    HLD (hyperlipidemia)    Hypertension    sees Dr. Crist Infante,    Past Surgical History:  Past Surgical History:  Procedure Laterality Date   APPENDECTOMY     1995   EYE SURGERY     bilateral surgery   LUMBAR LAMINECTOMY/DECOMPRESSION MICRODISCECTOMY N/A 08/29/2012   Procedure: LUMBAR LAMINECTOMY/DECOMPRESSION MICRODISCECTOMY 2 LEVELS;  Surgeon: Eustace Moore, MD;  Location: [facility];  Service: Neurosurgery;  Laterality: N/A;   SHOULDER SURGERY     right   HPI:  Pt is a 88 y.o. male who presented to the ED after fall and was admitted 11/12/2021 with severe sepsis due to multifocal pneumonia. Pt reportedly vomited during CT scan while supine. CT chest 5/18: Multifocal patchy opacities in the lungs bilaterally, lower lobe predominant, suspicious for pneumonia. Superimposed bilateral lower lobe atelectasis is possible. PMH: type 2 diabetes mellitus, essential hypertension, GERD, hyperlipidemia. MBS 06/04/21 revealed esophageal stasis, but a functional oropharyngeal swallow. A regular texture diet with thin liquids (via cup) was recommended as well as an esophagram due to noted stasis. No subsequent esophagram noted in pt's chart.    Assessment / Plan / Recommendation  Clinical Impression  Pt was seen for bedside swallow evaluation. He reported inconsistent globus sensation in his mid chest and occasional coughing with  p.o. intake. Oral mechanism exam was [facility] and he presented with adequate, natural dentition. He exhibited coughing with larger boluses of thin liquids via consecutive swallows and with a single full-spoon bolus of puree. Smaller boluses appeared more functional and no symptoms of oral phase dysphagia noted. Eructation was observed twice during the evaluation and preceded throat clearing. Considering pt's history and current presentation, SLP suspects a component of esophageal dysphagia. A modified barium swallow study will be completed to further assess oropharyngeal swallow function. SLP Visit Diagnosis: Dysphagia, unspecified (R13.10)    Aspiration Risk  Mild aspiration risk    Diet Recommendation Regular;Thin liquid (continue current diet with observance of swallowing precautions until MBS completed)   Liquid Administration via: Cup;Straw Medication Administration: Whole meds with liquid Compensations: Slow rate;Small sips/bites Postural Changes: Seated upright at 90 degrees    Other  Recommendations Oral Care Recommendations: Oral care BID    Recommendations for follow up therapy are one component of a multi-disciplinary discharge planning process, led by the attending physician.  Recommendations may be updated based on patient status, additional functional criteria and insurance authorization.  Follow up Recommendations  (TBD)      Assistance Recommended at Discharge    Functional Status Assessment Patient has had a recent decline in their functional status and demonstrates the ability to make significant improvements in function in a reasonable and predictable amount of time.  Frequency and Duration min 2x/week  2 weeks       Prognosis Prognosis for Safe Diet Advancement: Good      Swallow Study  General Date of Onset: 11/12/21 HPI: Pt is a 88 y.o. male who presented to the ED after fall and was admitted 11/12/2021 with severe sepsis due to multifocal pneumonia. Pt reportedly  vomited during CT scan while supine. CT chest 5/18: Multifocal patchy opacities in the lungs bilaterally, lower lobe predominant, suspicious for pneumonia. Superimposed bilateral lower lobe atelectasis is possible. PMH: type 2 diabetes mellitus, essential hypertension, GERD, hyperlipidemia. MBS 06/04/21 revealed esophageal stasis, but a functional oropharyngeal swallow. A regular texture diet with thin liquids (via cup) was recommended as well as an esophagram due to noted stasis. No subsequent esophagram noted in pt's chart. Type of Study: Bedside Swallow Evaluation Previous Swallow Assessment: See HPI Diet Prior to this Study: Regular;Thin liquids Temperature Spikes Noted: No Respiratory Status: Nasal cannula History of Recent Intubation: No Behavior/Cognition: Alert;Cooperative;Pleasant mood Oral Cavity Assessment: Within Functional Limits Oral Care Completed by SLP: No Oral Cavity - Dentition: Adequate natural dentition Vision: Functional for self-feeding Self-Feeding Abilities: Total assist Patient Positioning: Upright in bed;Postural control adequate for testing Baseline Vocal Quality: Normal Volitional Cough: Cognitively unable to elicit Volitional Swallow: Unable to elicit    Oral/Motor/Sensory Function Overall Oral Motor/Sensory Function: Within functional limits   Ice Chips Ice chips: Within functional limits Presentation: Spoon   Thin Liquid Thin Liquid: Impaired Presentation: Straw Pharyngeal  Phase Impairments: Cough - Delayed (with consecutive swallows)    Nectar Thick Nectar Thick Liquid: Not tested   Honey Thick Honey Thick Liquid: Not tested   Puree Puree: Impaired Presentation: Spoon Pharyngeal Phase Impairments: Cough - Delayed (with larger boluses)   Solid     Solid: Within functional limits Presentation: Self Fed     Robert Beltran Robert Beltran, [place], [place] Office number [phone] Pager [place] 11/13/2021,10:59 AM

## 2021-11-14 DIAGNOSIS — J189 Pneumonia, unspecified organism: Secondary | ICD-10-CM | POA: Diagnosis not present

## 2021-11-14 LAB — CBC
HCT: 38.7 % — ABNORMAL LOW (ref 39.0–52.0)
Hemoglobin: 12.4 g/dL — ABNORMAL LOW (ref 13.0–17.0)
MCH: 32.7 pg (ref 26.0–34.0)
MCHC: 32 g/dL (ref 30.0–36.0)
MCV: 102.1 fL — ABNORMAL HIGH (ref 80.0–100.0)
Platelets: 147 10*3/uL — ABNORMAL LOW (ref 150–400)
RBC: 3.79 MIL/uL — ABNORMAL LOW (ref 4.22–5.81)
RDW: 15.2 % (ref 11.5–15.5)
WBC: 12.3 10*3/uL — ABNORMAL HIGH (ref 4.0–10.5)
nRBC: 0 % (ref 0.0–0.2)

## 2021-11-14 LAB — BASIC METABOLIC PANEL
Anion gap: 7 (ref 5–15)
BUN: 21 mg/dL (ref 8–23)
CO2: 27 mmol/L (ref 22–32)
Calcium: 8 mg/dL — ABNORMAL LOW (ref 8.9–10.3)
Chloride: 103 mmol/L (ref 98–111)
Creatinine, Ser: 0.93 mg/dL (ref 0.61–1.24)
GFR, Estimated: >60 mL/min (ref >60)
Glucose, Bld: 140 mg/dL — ABNORMAL HIGH (ref 70–99)
Potassium: 4.9 mmol/L (ref 3.5–5.1)
Sodium: 137 mmol/L (ref 135–145)

## 2021-11-14 LAB — BRAIN NATRIURETIC PEPTIDE: B Natriuretic Peptide: 304.5 pg/mL — ABNORMAL HIGH (ref 0.0–100.0)

## 2021-11-14 LAB — GLUCOSE, CAPILLARY
Glucose-Capillary: 110 mg/dL — ABNORMAL HIGH (ref 70–99)
Glucose-Capillary: 88 mg/dL (ref 70–99)
Glucose-Capillary: 79 mg/dL (ref 70–99)
Glucose-Capillary: 135 mg/dL — ABNORMAL HIGH (ref 70–99)

## 2021-11-14 LAB — C-REACTIVE PROTEIN: CRP: 14.9 mg/dL — ABNORMAL HIGH (ref <1.0)

## 2021-11-14 LAB — MRSA NEXT GEN BY PCR, NASAL: MRSA by PCR Next Gen: NOT DETECTED

## 2021-11-14 MED ORDER — FUROSEMIDE 10 MG/ML IJ SOLN
20.0000 mg | Freq: Once | INTRAMUSCULAR | Status: AC
Start: 2021-11-14 — End: 2021-11-14
  Administered 2021-11-14: 20 mg via INTRAVENOUS
  Filled 2021-11-14: qty 2

## 2021-11-14 MED ORDER — APIXABAN 5 MG PO TABS: 5.0000 mg | ORAL_TABLET | Freq: Two times a day (BID) | ORAL | Status: DC

## 2021-11-14 MED ORDER — APIXABAN 5 MG PO TABS
10.0000 mg | ORAL_TABLET | Freq: Two times a day (BID) | ORAL | Status: DC
  Administered 2021-11-15 – 2021-11-16 (×3): 10 mg via ORAL
  Filled 2021-11-14 (×3): qty 2

## 2021-11-14 NOTE — Evaluation (Incomplete)
Occupational Therapy Evaluation Patient Details Name: Robert Beltran MRN: [id number] DOB: 31-May-1924 Today's Date: 11/14/2021   History of Present Illness Pt is a 88 y.o. male admitted 5/[facility] from [facility] with dx of severe sepsis due to multifocal pneumonia. He presented to the ED following a fall, hitting his head. CT negative. PMH:  type 2 diabetes mellitus, essential hypertension, hyperlipidemia   Clinical Impression   ***      Recommendations for follow up therapy are one component of a multi-disciplinary discharge planning process, led by the attending physician.  Recommendations may be updated based on patient status, additional functional criteria and insurance authorization.   Follow Up Recommendations  Skilled nursing-short term rehab (<3 hours/day)    Assistance Recommended at Discharge Frequent or constant Supervision/Assistance  Patient can return home with the following A lot of help with walking and/or transfers;A lot of help with bathing/dressing/bathroom;Assistance with cooking/housework;Direct supervision/assist for medications management;Help with stairs or ramp for entrance;Assist for transportation;Direct supervision/assist for financial management    Functional Status Assessment  Patient has had a recent decline in their functional status and demonstrates the ability to make significant improvements in function in a reasonable and predictable amount of time.  Equipment Recommendations  None recommended by OT;Other (comment) (defer to next venue of care)    Recommendations for Other Services PT consult     Precautions / Restrictions Precautions Precautions: Fall Restrictions Weight Bearing Restrictions: No      Mobility Bed Mobility               General bed mobility comments: pt declining, however RN reporting pt able to roll R/L for bedpan placement    Transfers                   General transfer comment: pt declining       Balance Overall balance assessment: History of Falls                                         ADL either performed or assessed with clinical judgement   ADL Overall ADL's : Needs assistance/impaired Eating/Feeding: Set up;Sitting;Bed level   Grooming: Sitting;Moderate assistance   Upper Body Bathing: Moderate assistance;Sitting   Lower Body Bathing: Maximal assistance;Sitting/lateral leans   Upper Body Dressing : Moderate assistance;Sitting   Lower Body Dressing: Maximal assistance;Sitting/lateral leans   Toilet Transfer: Maximal assistance;BSC/3in1;Stand-pivot   Toileting- Clothing Manipulation and Hygiene: Maximal assistance Toileting - Clothing Manipulation Details (indicate cue type and reason): catheter     Functional mobility during ADLs: Maximal assistance       Vision   Vision Assessment?: No apparent visual deficits     Perception     Praxis      Pertinent Vitals/Pain Pain Assessment Pain Assessment: Faces Pain Score: 2  Faces Pain Scale: Hurts a little bit Pain Location: generalized, reports BLE and BUE Pain Descriptors / Indicators: Discomfort Pain Intervention(s): Limited activity within patient's tolerance, Monitored during session     Hand Dominance Right   Extremity/Trunk Assessment Upper Extremity Assessment Upper Extremity Assessment: Generalized weakness   Lower Extremity Assessment Lower Extremity Assessment: Generalized weakness   Cervical / Trunk Assessment Cervical / Trunk Assessment: Kyphotic   Communication Communication Communication: HOH   Cognition Arousal/Alertness: Awake/alert Behavior During Therapy: WFL for tasks assessed/performed Overall Cognitive Status: Difficult to assess  General Comments: Cognition appears intact. Appropriate. Following commands, becoming slightly agitated with sitting EOB/mobility     General Comments  VSS on 6LO2 during  session    Exercises Exercises: General Upper Extremity, General Lower Extremity General Exercises - Upper Extremity Shoulder Flexion: AROM, Both, Supine General Exercises - Lower Extremity Heel Slides: AROM, Both, Supine   Shoulder Instructions      Home Living Family/patient expects to be discharged to:: Assisted living                             Home Equipment: Conservation officer, nature (2 wheels)   Additional Comments: [employer]      Prior Functioning/Environment Prior Level of Function : Independent/Modified Independent             Mobility Comments: Pt reports amb to/from dining room with RW. Unsure if he is a good historian. Cognition appears intact. 7 falls in the past per pt ADLs Comments: reports washing up at the sink due to recent falls        OT Problem List: Decreased strength;Decreased range of motion;Decreased activity tolerance;Impaired balance (sitting and/or standing);Decreased safety awareness      OT Treatment/Interventions: Self-care/ADL training;Therapeutic exercise;Therapeutic activities;Patient/family education;Balance training;DME and/or AE instruction;Energy conservation    OT Goals(Current goals can be found in the care plan section) Acute Rehab OT Goals Patient Stated Goal: none stated OT Goal Formulation: With patient Time For Goal Achievement: 11/28/21 Potential to Achieve Goals: Good ADL Goals Pt Will Perform Grooming: with min assist;sitting Pt Will Perform Upper Body Dressing: with mod assist;standing;sitting Pt Will Perform Lower Body Dressing: with mod assist;sitting/lateral leans;sit to/from stand Pt Will Transfer to Toilet: with mod assist;bedside commode;stand pivot transfer Pt Will Perform Tub/Shower Transfer: Shower transfer;Tub transfer;rolling walker;shower seat;ambulating;3 in 1  OT Frequency: Min 2X/week    Co-evaluation              AM-PAC OT "6 Clicks" Daily Activity     Outcome Measure Help from  another person eating meals?: None Help from another person taking care of personal grooming?: A Lot Help from another person toileting, which includes using toliet, bedpan, or urinal?: A Lot Help from another person bathing (including washing, rinsing, drying)?: A Lot Help from another person to put on and taking off regular upper body clothing?: A Lot Help from another person to put on and taking off regular lower body clothing?: A Lot 6 Click Score: 14   End of Session Equipment Utilized During Treatment: Oxygen Nurse Communication: Mobility status  Activity Tolerance: Other (comment) (Treatment limited to pt declining) Patient left: in bed;with call bell/phone within reach;with bed alarm set  OT Visit Diagnosis: Unsteadiness on feet (R26.81);Other abnormalities of gait and mobility (R26.89);Muscle weakness (generalized) (M62.81)                Time: 8280-0349 OT Time Calculation (min): 22 min Charges:  OT General Charges $OT Visit: 1 Visit OT Evaluation $OT Eval Low Complexity: [place], OTD, OTR/L Acute Rehab [phone]) [phone] [place] 11/14/2021, 2:51 PM

## 2021-11-14 NOTE — Progress Notes (Signed)
PROGRESS NOTE        PATIENT DETAILS Name: Robert Beltran Age: 88 y.o. Sex: male Date of Birth: Jan 17, 1924 Admit Date: 11/12/2021 Admitting Physician Rhetta Mura, DO WUJ:[id number], Elta Guadel[place], MD  Brief Summary: Patient is a 88 y.o.  male with DM-2, HTN, HLD-who presented from his assisted living facility for evaluation of a cough/exertional dyspnea and a mechanical fall-upon further evaluation-he was found to have sepsis due to multifocal pneumonia and pulmonary embolism.    Significant events: 5/18>> admit to [facility]-sepsis for PNA-small PE  Significant studies: 5/18>> CXR: Bibasilar atelectasis 5/18>> x-ray pelvis: No fracture 5/18>> x-ray right knee: No fracture/dislocation/joint effusion. 5/18>> x-ray left knee: No fracture/dislocation/joint effusion 5/18>> CT head: No acute intracranial process. 5/18>> CT C-spine: No fracture/dislocation-multilevel spondylosis/facet hypertrophy. 5/18>> CTA chest: Isolated subsegmental right upper lobe pulmonary emboli-multifocal PNA.  Significant microbiology data: 5/18>> COVID/influenza PCR: Negative 5/18>> blood culture: No growth  Procedures:  CTA - Isolated subsegmental right upper lobe pulmonary embolism. Overall clot burden is very small. Multifocal pneumonia, lower lobe predominant.   CT Head - Non acute  Leg [place] - No DVT  Echo -  1. Left ventricular ejection fraction, by estimation, is 50 to 55%. The left ventricle has low normal function. The left ventricle has no regional wall motion abnormalities. Left ventricular diastolic parameters were normal.  2. Right ventricular systolic function is normal. The right ventricular size is normal. There is normal pulmonary artery systolic pressure.  3. The mitral valve is abnormal. Trivial mitral valve regurgitation. No evidence of mitral stenosis.  4. The aortic valve is tricuspid. There is moderate calcification of the aortic valve. There is moderate thickening of the  aortic valve. Aortic valve regurgitation is mild. Aortic valve sclerosis/calcification is present, without any evidence of aortic stenosis.  5. Aortic dilatation noted. There is mild dilatation of the ascending aorta, measuring 38 mm.  6. The inferior vena cava is normal in size with greater than 50% respiratory variability, suggesting right atrial pressure of 3 mmHg.  Consults:  None   Subjective:  Patient in bed, appears comfortable, denies any headache, no fever, no chest pain or pressure, no shortness of breath , no abdominal pain. No new focal weakness.   Objective: Vitals: Blood pressure (!) 119/47, pulse 79, temperature 97.9 F (36.6 C), temperature source Oral, resp. rate 18, height '5\' 2"'$  (1.575 m), weight 71.5 kg, SpO2 92 %.   Exam:  Awake Alert, No new F.N deficits, Normal affect [place].AT,PERRAL Supple Neck, No JVD,   Symmetrical Chest wall movement, Good air movement bilaterally, CTAB RRR,No Gallops, Rubs or new Murmurs,  +ve B.Sounds, Abd Soft, No tenderness,   No Cyanosis, Clubbing or edema     Assessment/Plan:  Severe sepsis due to multifocal pneumonia with acute hypoxic respiratory failure: Likely aspiration PNA-sepsis physiology has resolved continue Unasyn, speech therapy following.  Continue feeding assistance and aspiration precautions, continue antibiotics.  Overall weak and frail will require placement.  Added I-S and flutter valve for pulmonary toiletry, advance activity and titrate down oxygen as tolerated.  Acute sided pulmonary embolism: On full dose Lovenox switch to Eliquis on 11/15/2021  Chronic HFpEF: Euvolemic  HTN: BP stable-amlodipine/chlorthalidone/ramipril on hold.  HLD: Continue statin  CKD stage IIIa: Close to baseline-follow electrolytes periodically.  Mechanical fall: Imaging negative for fractures-see above-obtaining PT/OT.  May require SNF.  Palliative care: Elderly patient  with decent quality of life for his age, discussed with son for now  full code.  DM-2 (A1c 6.3 on 5/19): Stable on SSI-resume metformin on discharge.  Lab Results  Component Value Date   HGBA1C 6.3 (H) 11/13/2021   CBG (last 3)  Recent Labs    11/13/21 1547 11/13/21 2118 11/14/21 0739  GLUCAP 163* 119* 110*     BMI: Estimated body mass index is 28.83 kg/m as calculated from the following:   Height as of this encounter: '5\' 2"'$  (1.575 m).   Weight as of this encounter: 71.5 kg.   Code status:   Code Status: Full Code   DVT Prophylaxis: SCDs Start: 11/12/21 2352 IV heparin  Family Communication:   Son Tereso Descoteaux-[phone]-on 11/14/21   Disposition Plan: Status is: Inpatient Remains inpatient appropriate because: PNA/PE-on IV antibiotics-anticoagulation-still on oxygen-not yet stable for discharge.   Planned Discharge Destination:Skilled nursing facility   Diet: Diet Order             Diet regular Room service appropriate? Yes; Fluid consistency: Thin  Diet effective now                     Antimicrobial agents: Anti-infectives (From admission, onward)    Start     Dose/Rate Route Frequency Ordered Stop   11/13/21 1300  Ampicillin-Sulbactam (UNASYN) 3 g in sodium chloride 0.9 % 100 mL IVPB        3 g 200 mL/hr over 30 Minutes Intravenous Every 8 hours 11/13/21 1203     11/13/21 0600  piperacillin-tazobactam (ZOSYN) IVPB 3.375 g  Status:  Discontinued        3.375 g 12.5 mL/hr over 240 Minutes Intravenous Every 8 hours 11/13/21 0009 11/13/21 1118   11/12/21 2345  piperacillin-tazobactam (ZOSYN) IVPB 3.375 g        3.375 g 100 mL/hr over 30 Minutes Intravenous  Once 11/12/21 2332 11/13/21 0121        MEDICATIONS: Scheduled Meds:  enoxaparin (LOVENOX) injection  70 mg Subcutaneous BID   escitalopram  5 mg Oral Daily   furosemide  20 mg Intravenous Once   insulin aspart  0-6 Units Subcutaneous TID WC   levothyroxine  75 mcg Oral QAC breakfast   rosuvastatin  20 mg Oral Daily   Continuous Infusions:   ampicillin-sulbactam (UNASYN) IV 3 g (11/14/21 0450)   PRN Meds:.acetaminophen **OR** acetaminophen   I have personally reviewed following labs and imaging studies  LABORATORY DATA:  Recent Labs  Lab 11/12/21 2110 11/13/21 0411 11/14/21 0135  WBC 17.0* 16.1* 12.3*  HGB 14.3 14.0 12.4*  HCT 43.5 43.1 38.7*  PLT 144* 163 147*  MCV 103.6* 101.2* 102.1*  MCH 34.0 32.9 32.7  MCHC 32.9 32.5 32.0  RDW 15.1 15.2 15.2  LYMPHSABS 0.3* 0.2*  --   MONOABS 0.6 0.5  --   EOSABS 0.0 0.0  --   BASOSABS 0.0 0.0  --     Recent Labs  Lab 11/12/21 2008 11/12/21 2030 11/12/21 2055 11/12/21 2218 11/12/21 2357 11/13/21 0134 11/13/21 0411 11/13/21 0748 11/14/21 0135 11/14/21 0623  NA  --   --  136  --   --   --  139  --  137  --   K  --   --  5.1  --   --   --  4.9  --  4.9  --   CL  --   --  103  --   --   --  102  --  103  --   CO2  --   --  22  --   --   --  30  --  27  --   GLUCOSE  --   --  239*  --   --   --  170*  --  140*  --   BUN  --   --  17  --   --   --  18  --  21  --   CREATININE  --   --  1.04  --   --   --  1.05  --  0.93  --   CALCIUM  --   --  8.6*  --   --   --  8.5*  --  8.0*  --   AST  --   --   --   --   --   --  21  --   --   --   ALT  --   --   --   --   --   --  16  --   --   --   ALKPHOS  --   --   --   --   --   --  55  --   --   --   BILITOT  --   --   --   --   --   --  0.7  --   --   --   ALBUMIN  --   --   --   --   --   --  2.8*  --   --   --   MG  --   --   --  1.8  --   --  1.8  --   --   --   PHOS  --   --   --   --   --   --  3.4  --   --   --   CRP  --   --   --   --   --   --   --   --   --  14.9*  DDIMER 7.66*  --   --   --   --   --   --   --   --   --   PROCALCITON  --   --   --   --   --   --  0.91  --   --   --   LATICACIDVEN  --   --   --   --  2.3* 2.6*  --  2.7*  --   --   HGBA1C  --   --   --   --   --   --  6.3*  --   --   --   BNP  --  205.6*  --   --   --   --   --   --   --  304.5*         RADIOLOGY STUDIES/RESULTS: DG  Chest 2 View  Result Date: 11/12/2021 CLINICAL DATA:  Fall EXAM: CHEST - 2 VIEW COMPARISON:  08/29/2012 FINDINGS: Shallow lung inflation with bibasilar atelectasis. Mild cardiomegaly. Normal pleural spaces. IMPRESSION: Shallow lung inflation with bibasilar atelectasis. Electronically Signed   By: Ulyses Jarred M.D.   On: 11/12/2021 19:44   DG Pelvis 1-2 Views  Result Date: 11/12/2021 CLINICAL DATA:  Fall EXAM: PELVIS -  1-2 VIEW COMPARISON:  None Available. FINDINGS: There is no evidence of pelvic fracture or diastasis. No pelvic bone lesions are seen. Dense stool in the colon. IMPRESSION: Negative. Electronically Signed   By: Ulyses Jarred M.D.   On: 11/12/2021 19:52   CT Head Wo Contrast  Result Date: 11/12/2021 CLINICAL DATA:  [place], hit head EXAM: CT HEAD WITHOUT CONTRAST TECHNIQUE: Contiguous axial images were obtained from the base of the skull through the vertex without intravenous contrast. RADIATION DOSE REDUCTION: This exam was performed according to the departmental dose-optimization program which includes automated exposure control, adjustment of the mA and/or kV according to patient size and/or use of iterative reconstruction technique. COMPARISON:  10/30/2015 FINDINGS: Brain: Chronic small vessel ischemic changes are seen throughout the periventricular white matter and bilateral basal ganglia, progressive since prior exam. No evidence of acute infarct or hemorrhage. Lateral ventricles and midline structures are unremarkable. No acute extra-axial fluid collections. No mass effect. Vascular: Diffuse atherosclerosis of the internal carotid arteries. No hyperdense vessel. Skull: Normal. Negative for fracture or focal lesion. Sinuses/Orbits: No acute finding. Other: None. IMPRESSION: 1. No acute intracranial process. 2. Chronic small-vessel ischemic changes throughout the periventricular white matter and bilateral basal ganglia. Electronically Signed   By: Randa Ngo M.D.   On: 11/12/2021 20:01    CT Angio Chest PE W and/or Wo Contrast  Result Date: 11/12/2021 CLINICAL DATA:  Fall, evaluate for PE EXAM: CT ANGIOGRAPHY CHEST WITH CONTRAST TECHNIQUE: Multidetector CT imaging of the chest was performed using the standard protocol during bolus administration of intravenous contrast. Multiplanar CT image reconstructions and MIPs were obtained to evaluate the vascular anatomy. RADIATION DOSE REDUCTION: This exam was performed according to the departmental dose-optimization program which includes automated exposure control, adjustment of the mA and/or kV according to patient size and/or use of iterative reconstruction technique. CONTRAST:  18m OMNIPAQUE IOHEXOL 350 MG/ML SOLN COMPARISON:  Chest radiograph dated 11/12/2021 FINDINGS: Cardiovascular: Satisfactory opacification of the bilateral pulmonary arteries to the segmental level. Evaluation is mildly constrained by respiratory motion. Within that constraint, there is an isolated subsegmental right upper lobe pulmonary embolism (series 7/image 124). Overall clot burden is very small. Study is not tailored for evaluation of the thoracic aorta. Atherosclerotic calcifications of the arch. Heart is normal in size.  No pericardial effusion. Three vessel coronary atherosclerosis. Mediastinum/Nodes: Small mediastinal lymph nodes, likely reactive. Visualized thyroid is unremarkable. Lungs/Pleura: Multifocal patchy opacities in the lungs bilaterally, lower lobe predominant, suspicious for pneumonia. Superimposed bilateral lower lobe atelectasis is possible. Evaluation lung parenchyma is constrained by respiratory motion. Within that constraint, there are no suspicious pulmonary nodules. No pleural effusion or pneumothorax. Upper Abdomen: Visualized upper abdomen is grossly unremarkable, noting vascular calcifications. Musculoskeletal: Degenerative changes at T12-L1. No rib fracture is seen. Degenerative changes of the bilateral shoulders, right greater than left.  Review of the MIP images confirms the above findings. IMPRESSION: Isolated subsegmental right upper lobe pulmonary embolism. Overall clot burden is very small. Multifocal pneumonia, lower lobe predominant. Critical Value/emergent results were called by telephone at the time of interpretation on 11/12/2021 at 11:43 pm to provider JULIE HAVILAND , who verbally acknowledged these results. Aortic Atherosclerosis (ICD10-I70.0). Electronically Signed   By: SJulian HyM.D.   On: 11/12/2021 23:46   CT Cervical Spine Wo Contrast  Result Date: 11/12/2021 CLINICAL DATA:  [place] hit head EXAM: CT CERVICAL SPINE WITHOUT CONTRAST TECHNIQUE: Multidetector CT imaging of the cervical spine was performed without intravenous contrast. Multiplanar CT image reconstructions were also  generated. RADIATION DOSE REDUCTION: This exam was performed according to the departmental dose-optimization program which includes automated exposure control, adjustment of the mA and/or kV according to patient size and/or use of iterative reconstruction technique. COMPARISON:  None Available. FINDINGS: Alignment: Alignment is grossly anatomic. Skull base and vertebrae: No acute fracture. No primary bone lesion or focal pathologic process. Soft tissues and spinal canal: No prevertebral fluid or swelling. No visible canal hematoma. Disc levels: There is severe multilevel spondylosis and facet hypertrophy. There is bony fusion across the C3-4 disc space. Severe disc space narrowing and osteophyte formation are seen at C4-5, C5-6, and C6-7. Diffuse facet hypertrophic changes greatest at C2-3 and C3-4. Upper chest: Airway is patent. Lung apices are clear. Incidental note is made of fluid-filled upper thoracic esophagus which could reflect reflux. Other: Reconstructed images demonstrate no additional findings. IMPRESSION: 1. No acute cervical spine fracture. 2. Severe multilevel spondylosis and facet hypertrophy as above. Electronically Signed   By:  Randa Ngo M.D.   On: 11/12/2021 20:04   DG Knee Complete 4 Views Left  Result Date: 11/12/2021 CLINICAL DATA:  Fall EXAM: LEFT KNEE - COMPLETE 4+ VIEW COMPARISON:  None Available. FINDINGS: No evidence of fracture, dislocation, or joint effusion. No evidence of arthropathy or other focal bone abnormality. Soft tissues are unremarkable. IMPRESSION: Negative. Electronically Signed   By: Ulyses Jarred M.D.   On: 11/12/2021 19:49   DG Knee Complete 4 Views Right  Result Date: 11/12/2021 CLINICAL DATA:  Fall EXAM: RIGHT KNEE - COMPLETE 4+ VIEW COMPARISON:  None Available. FINDINGS: No evidence of fracture, dislocation, or joint effusion. No evidence of arthropathy or other focal bone abnormality. Soft tissues are unremarkable. IMPRESSION: Negative. Electronically Signed   By: Ulyses Jarred M.D.   On: 11/12/2021 19:50   DG Swallowing Func-Speech Pathology  Result Date: 11/13/2021 Table formatting from the original result was not included. Objective Swallowing Evaluation: Type of Study: MBS-Modified Barium Swallow Study  Patient Details Name: TAHMIR KLECKNER MRN: [id number] Date of Birth: [birth year] Today's Date: 11/13/2021 Time: SLP Start Time (ACUTE ONLY): 1450 -SLP Stop Time (ACUTE ONLY): 1515 SLP Time Calculation (min) (ACUTE ONLY): 25 min Past Medical History: Past Medical History: Diagnosis Date  Arthritis   Benign prostate hyperplasia   Cancer ([place])   testicular cancer 1964  Chronic back pain   Depression   Diabetes mellitus without complication (HCC)   DJD (degenerative joint disease)   GERD (gastroesophageal reflux disease)   HLD (hyperlipidemia)   Hypertension   sees Dr. Crist Infante,  Past Surgical History: Past Surgical History: Procedure Laterality Date  APPENDECTOMY    1995  EYE SURGERY    bilateral surgery  LUMBAR LAMINECTOMY/DECOMPRESSION MICRODISCECTOMY N/A 08/29/2012  Procedure: LUMBAR LAMINECTOMY/DECOMPRESSION MICRODISCECTOMY 2 LEVELS;  Surgeon: Eustace Moore, MD;  Location: [facility];   Service: Neurosurgery;  Laterality: N/A;  SHOULDER SURGERY    right HPI: Pt is a 88 y.o. male who presented to the ED after fall and was admitted 11/12/2021 with severe sepsis due to multifocal pneumonia. Pt reportedly vomited during CT scan while supine. CT chest 5/18: Multifocal patchy opacities in the lungs bilaterally, lower lobe predominant, suspicious for pneumonia. Superimposed bilateral lower lobe atelectasis is possible. PMH: type 2 diabetes mellitus, essential hypertension, GERD, hyperlipidemia. MBS 06/04/21 revealed esophageal stasis, but a functional oropharyngeal swallow. A regular texture diet with thin liquids (via cup) was recommended as well as an esophagram due to noted stasis. No subsequent esophagram noted in pt's chart.  No data recorded  Recommendations for follow up therapy are one component of a multi-disciplinary discharge planning process, led by the attending physician.  Recommendations may be updated based on patient status, additional functional criteria and insurance authorization. Assessment / Plan / Recommendation   11/13/2021   4:12 PM Clinical Impressions Clinical Impression Cervical osteophytes noted C4-6 with moderate anterior protrusion into the pharynx. Pt's overall pharyngeal swallow appears worse compared to the study on 06/04/21. Pt presents with pharyngeal dsyphagia characterized by a phrayngeal delay, as well as reduction in tongue base retraction, pharyngeal constriction, and anterior laryngeal movement.  He demonstrated vallecular residue, posterior pharyngeal wall residue, and pyriform sinus residue. With large boluses of thin liquids, pt appeared to have difficulty maintaining laryngeal closure, and generating enough pressure to facilitate transit of these boluses past the point of the cervical osteophytes. Penetration (PAS 3, 5) and aspiration (PAS 7, 8) were therefore noted with large boluses of thin liquids via straw and with consecutive swallows of thin liquids via cup.  Throat clearing and a slight delayed cough were inconsistently noted following apsiration. Prompted coughing was ineffective in expelling aspirate, but did mobilize material within the larynx. No functional benefit was noted with a chin tuck posture which increased laryngeal invasion and episodes of aspiration. Pt independently reduced bolus sizes with nectar thick liquids via cup and no laryngeal invasion was noted therewith. Penetration and aspiration were eliminated with individual, smaller boluses of thin liquids via cup. Secondary swallows were effective in reducing pharyngeal residue to a functional level. Pt's current diet of regular texture solids and thin liquids will be continued with strict observance of swallowing precautions. SLP will follow to ensure tolerance for dysphagia treatment. SLP Visit Diagnosis Dysphagia, pharyngeal phase (R13.13) Impact on safety and function Mild aspiration risk     11/13/2021   4:12 PM Treatment Recommendations Treatment Recommendations Therapy as outlined in treatment plan below     11/13/2021   4:12 PM Prognosis Prognosis for Safe Diet Advancement Good Barriers to Reach Goals Cognitive deficits   11/13/2021   4:12 PM Diet Recommendations SLP Diet Recommendations Regular solids;Thin liquid Liquid Administration via Cup;No straw Medication Administration Whole meds with puree Compensations Slow rate;Small sips/bites;Multiple dry swallows after each bite/sip Postural Changes Seated upright at 90 degrees     11/13/2021   4:12 PM Other Recommendations Oral Care Recommendations Oral care BID Follow Up Recommendations Skilled nursing-short term rehab (<3 hours/day) Functional Status Assessment Patient has had a recent decline in their functional status and demonstrates the ability to make significant improvements in function in a reasonable and predictable amount of time.   11/13/2021   4:12 PM Frequency and Duration  Speech Therapy Frequency (ACUTE ONLY) min 2x/week Treatment  Duration 2 weeks     11/13/2021   4:12 PM Oral Phase Oral Phase [facility]    11/13/2021   4:12 PM Pharyngeal Phase Pharyngeal Phase Impaired Pharyngeal- Nectar Cup Delayed swallow initiation-vallecula;Delayed swallow initiation-pyriform sinuses;Reduced pharyngeal peristalsis;Pharyngeal residue - pyriform;Pharyngeal residue - posterior pharnyx;Pharyngeal residue - valleculae;Reduced anterior laryngeal mobility;Reduced tongue base retraction Pharyngeal- Thin Cup Delayed swallow initiation-vallecula;Delayed swallow initiation-pyriform sinuses;Reduced pharyngeal peristalsis;Pharyngeal residue - pyriform;Pharyngeal residue - posterior pharnyx;Pharyngeal residue - valleculae;Reduced anterior laryngeal mobility;Reduced tongue base retraction;Penetration/Aspiration during swallow Pharyngeal Material enters airway, remains ABOVE vocal cords and not ejected out;Material enters airway, CONTACTS cords and not ejected out;Material enters airway, passes BELOW cords and not ejected out despite cough attempt by patient;Material enters airway, passes BELOW cords without attempt by patient to eject out (silent  aspiration) Pharyngeal- Thin Straw Delayed swallow initiation-vallecula;Delayed swallow initiation-pyriform sinuses;Reduced pharyngeal peristalsis;Pharyngeal residue - pyriform;Pharyngeal residue - posterior pharnyx;Pharyngeal residue - valleculae;Reduced anterior laryngeal mobility;Reduced tongue base retraction;Penetration/Aspiration during swallow;Penetration/Apiration after swallow Pharyngeal Material enters airway, passes BELOW cords and not ejected out despite cough attempt by patient;Material enters airway, passes BELOW cords without attempt by patient to eject out (silent aspiration) Pharyngeal- Puree Delayed swallow initiation-vallecula;Delayed swallow initiation-pyriform sinuses;Reduced pharyngeal peristalsis;Pharyngeal residue - pyriform;Pharyngeal residue - posterior pharnyx;Pharyngeal residue - valleculae;Reduced anterior  laryngeal mobility;Reduced tongue base retraction Pharyngeal- Regular Delayed swallow initiation-vallecula;Delayed swallow initiation-pyriform sinuses;Reduced pharyngeal peristalsis;Pharyngeal residue - pyriform;Pharyngeal residue - posterior pharnyx;Pharyngeal residue - valleculae;Reduced anterior laryngeal mobility;Reduced tongue base retraction Pharyngeal- Pill Delayed swallow initiation-vallecula;Delayed swallow initiation-pyriform sinuses;Reduced pharyngeal peristalsis;Pharyngeal residue - pyriform;Pharyngeal residue - posterior pharnyx;Pharyngeal residue - valleculae;Reduced anterior laryngeal mobility;Reduced tongue base retraction;Penetration/Aspiration during swallow;Penetration/Aspiration before swallow Pharyngeal Material enters airway, passes BELOW cords without attempt by patient to eject out (silent aspiration)    11/13/2021   4:12 PM Cervical Esophageal Phase  Cervical Esophageal Phase WFL (mild stasis noted in the distal esophagus) Shanika I. Hardin Negus, [place], [place] Office number [phone] Pager [phone] Horton Marshall 11/13/2021, 4:33 PM                     ECHOCARDIOGRAM COMPLETE  Result Date: 11/13/2021    ECHOCARDIOGRAM REPORT   Patient Name:   MICHAI DIEPPA Date of Exam: 11/13/2021 Medical Rec #:  [id number]      Height:       62.0 in Accession #:    [id number]     Weight:       157.6 lb Date of Birth:  1924/02/03      BSA:          1.728 m Patient Age:    89 years       BP:           123/45 mmHg Patient Gender: M              HR:           69 bpm. Exam Location:  Inpatient Procedure: 2D Echo, Cardiac Doppler and Color Doppler Indications:    Pulmonary Embolus I26.09  History:        Patient has prior history of Echocardiogram examinations, most                 recent 08/25/2012. Risk Factors:Hypertension, Dyslipidemia, GERD                 and Diabetes.  Sonographer:    Bernadene Person RDCS Referring Phys: [place]  1. Left  ventricular ejection fraction, by estimation, is 50 to 55%. The left ventricle has low normal function. The left ventricle has no regional wall motion abnormalities. Left ventricular diastolic parameters were normal.  2. Right ventricular systolic function is normal. The right ventricular size is normal. There is normal pulmonary artery systolic pressure.  3. The mitral valve is abnormal. Trivial mitral valve regurgitation. No evidence of mitral stenosis.  4. The aortic valve is tricuspid. There is moderate calcification of the aortic valve. There is moderate thickening of the aortic valve. Aortic valve regurgitation is mild. Aortic valve sclerosis/calcification is present, without any evidence of aortic stenosis.  5. Aortic dilatation noted. There is mild dilatation of the ascending aorta, measuring 38 mm.  6. The inferior vena cava is normal in size with greater than 50% respiratory variability, suggesting right atrial pressure  of 3 mmHg. FINDINGS  Left Ventricle: Abnormal septal motion inferior basal hypokinesis. Left ventricular ejection fraction, by estimation, is 50 to 55%. The left ventricle has low normal function. The left ventricle has no regional wall motion abnormalities. The left ventricular internal cavity size was normal in size. There is no left ventricular hypertrophy. Left ventricular diastolic parameters were normal. Right Ventricle: The right ventricular size is normal. No increase in right ventricular wall thickness. Right ventricular systolic function is normal. There is normal pulmonary artery systolic pressure. The tricuspid regurgitant velocity is 2.87 m/s, and  with an assumed right atrial pressure of 3 mmHg, the estimated right ventricular systolic pressure is 26.9 mmHg. Left Atrium: Left atrial size was normal in size. Right Atrium: Right atrial size was normal in size. Pericardium: There is no evidence of pericardial effusion. Mitral Valve: The mitral valve is abnormal. There is mild  thickening of the mitral valve leaflet(s). There is mild calcification of the mitral valve leaflet(s). Mild mitral annular calcification. Trivial mitral valve regurgitation. No evidence of mitral valve stenosis. Tricuspid Valve: The tricuspid valve is normal in structure. Tricuspid valve regurgitation is mild . No evidence of tricuspid stenosis. Aortic Valve: The aortic valve is tricuspid. There is moderate calcification of the aortic valve. There is moderate thickening of the aortic valve. Aortic valve regurgitation is mild. Aortic regurgitation PHT measures 455 msec. Aortic valve sclerosis/calcification is present, without any evidence of aortic stenosis. Pulmonic Valve: The pulmonic valve was normal in structure. Pulmonic valve regurgitation is not visualized. No evidence of pulmonic stenosis. Aorta: Aortic dilatation noted. There is mild dilatation of the ascending aorta, measuring 38 mm. Venous: The inferior vena cava is normal in size with greater than 50% respiratory variability, suggesting right atrial pressure of 3 mmHg. IAS/Shunts: No atrial level shunt detected by color flow Doppler.  LEFT VENTRICLE PLAX 2D LVIDd:         4.20 cm     Diastology LVIDs:         3.40 cm     LV e' medial:    3.85 cm/s LV PW:         1.00 cm     LV E/e' medial:  18.8 LV IVS:        1.00 cm     LV e' lateral:   6.73 cm/s LVOT diam:     2.00 cm     LV E/e' lateral: 10.7 LV SV:         68 LV SV Index:   40 LVOT Area:     3.14 cm  LV Volumes (MOD) LV vol d, MOD A2C: 93.1 ml LV vol d, MOD A4C: 82.0 ml LV vol s, MOD A2C: 46.8 ml LV vol s, MOD A4C: 37.8 ml LV SV MOD A2C:     46.3 ml LV SV MOD A4C:     82.0 ml LV SV MOD BP:      45.6 ml RIGHT VENTRICLE RV S prime:     10.90 cm/s TAPSE (M-mode): 1.7 cm LEFT ATRIUM             Index        RIGHT ATRIUM           Index LA diam:        3.30 cm 1.91 cm/m   RA Area:     13.60 cm LA Vol (A2C):   44.8 ml 25.93 ml/m  RA Volume:   27.40 ml  15.86 ml/m LA Vol (A4C):  41.7 ml 24.14 ml/m LA  Biplane Vol: 46.7 ml 27.03 ml/m  AORTIC VALVE LVOT Vmax:   89.40 cm/s LVOT Vmean:  66.800 cm/s LVOT VTI:    0.218 m AI PHT:      455 msec  AORTA Ao Root diam: 3.80 cm Ao Asc diam:  3.80 cm MITRAL VALVE               TRICUSPID VALVE MV Area (PHT): 4.26 cm    TR Peak grad:   32.9 mmHg MV Decel Time: 178 msec    TR Vmax:        287.00 cm/s MR PISA:        0.57 cm MR PISA Radius: 0.30 cm    SHUNTS MV E velocity: 72.30 cm/s  Systemic VTI:  0.22 m MV A velocity: 86.80 cm/s  Systemic Diam: 2.00 cm MV E/A ratio:  0.83 Jenkins Rouge MD Electronically signed by Jenkins Rouge MD Signature Date/Time: 11/13/2021/6:04:42 PM    Final    VAS [place] LOWER EXTREMITY VENOUS (DVT)  Result Date: 11/13/2021  Lower Venous DVT Study Patient Name:  STONEWALL DOSS  Date of Exam:   11/13/2021 Medical Rec #: [id number]       Accession #:    [id number] Date of Birth: 1923/11/04       Patient Gender: M Patient Age:   89 years Exam Location:  [facility] Procedure:      VAS [place] LOWER EXTREMITY VENOUS (DVT) Referring Phys: Oren Binet --------------------------------------------------------------------------------  Indications: Pulmonary embolism, and Swelling.  Comparison Study: No recent prior studies. Performing Technologist: Darlin Coco RDMS, RVT  Examination Guidelines: A complete evaluation includes B-mode imaging, spectral Doppler, color Doppler, and power Doppler as needed of all accessible portions of each vessel. Bilateral testing is considered an integral part of a complete examination. Limited examinations for reoccurring indications may be performed as noted. The reflux portion of the exam is performed with the patient in reverse Trendelenburg.  +---------+---------------+---------+-----------+----------+--------------+ RIGHT    CompressibilityPhasicitySpontaneityPropertiesThrombus Aging +---------+---------------+---------+-----------+----------+--------------+ CFV      Full           Yes      Yes                                  +---------+---------------+---------+-----------+----------+--------------+ SFJ      Full                                                        +---------+---------------+---------+-----------+----------+--------------+ FV Prox  Full                                                        +---------+---------------+---------+-----------+----------+--------------+ FV Mid   Full                                                        +---------+---------------+---------+-----------+----------+--------------+ FV DistalFull                                                        +---------+---------------+---------+-----------+----------+--------------+  PFV      Full                                                        +---------+---------------+---------+-----------+----------+--------------+ POP      Full           Yes      Yes                                 +---------+---------------+---------+-----------+----------+--------------+ PTV      Full                                                        +---------+---------------+---------+-----------+----------+--------------+ PERO     Full                                                        +---------+---------------+---------+-----------+----------+--------------+   +---------+---------------+---------+-----------+----------+--------------+ LEFT     CompressibilityPhasicitySpontaneityPropertiesThrombus Aging +---------+---------------+---------+-----------+----------+--------------+ CFV      Full           Yes      Yes                                 +---------+---------------+---------+-----------+----------+--------------+ SFJ      Full                                                        +---------+---------------+---------+-----------+----------+--------------+ FV Prox  Full                                                         +---------+---------------+---------+-----------+----------+--------------+ FV Mid   Full                                                        +---------+---------------+---------+-----------+----------+--------------+ FV DistalFull                                                        +---------+---------------+---------+-----------+----------+--------------+ PFV      Full                                                        +---------+---------------+---------+-----------+----------+--------------+  POP      Full           Yes      Yes                                 +---------+---------------+---------+-----------+----------+--------------+ PTV      Full                                                        +---------+---------------+---------+-----------+----------+--------------+ PERO     Full                                                        +---------+---------------+---------+-----------+----------+--------------+     Summary: RIGHT: - There is no evidence of deep vein thrombosis in the lower extremity.  - No cystic structure found in the popliteal fossa.  LEFT: - There is no evidence of deep vein thrombosis in the lower extremity.  - No cystic structure found in the popliteal fossa.  *See table(s) above for measurements and observations. Electronically signed by Jamelle Haring on 11/13/2021 at 5:25:31 PM.    Final      LOS: 2 days   Signature  Lala Lund M.D on 11/14/2021 at 9:08 AM   -  To page go to [email or web address]

## 2021-11-14 NOTE — TOC Progression Note (Addendum)
Transition of Care [facility]) - Progression Note    Patient Details  Name: Robert Beltran MRN: [id number] Date of Birth: [birth year]  Transition of [facility] CM/SW Contact  Joanne Chars, LCSW Phone Number: 11/14/2021, 2:03 PM  Clinical Narrative:   CSW spoke with pt son Robert Beltran regarding bed offers.  He does want to accept offer at [facility].  [facility] notified.    1415: auth request submitted in [place].    Expected Discharge Plan: [place] Barriers to Discharge: Continued Medical Work up, Ship broker, SNF Pending bed offer  Expected Discharge Plan and Services Expected Discharge Plan: [place] In-house Referral: Clinical Social Work   Post Acute Care Choice: [place] Living arrangements for the past 2 months: [place]                                       Social Determinants of Health (SDOH) Interventions    Readmission Risk Interventions     View : No data to display.

## 2021-11-14 NOTE — Evaluation (Addendum)
Occupational Therapy Evaluation Patient Details Name: Robert Beltran MRN: [id number] DOB: Dec 24, 1923 Today's Date: 11/14/2021   History of Present Illness Pt is a 88 y.o. male admitted 5/18 from [facility] with dx of severe sepsis due to multifocal pneumonia. He presented to the ED following a fall, hitting his head. CT negative. PMH:  type 2 diabetes mellitus, essential hypertension, hyperlipidemia   Clinical Impression   Pt reports independence at baseline with ADLs, used RW for functional mobility, reports living in [place]. Pt disoriented to month/year initially, however able to recall place and situation. Limited evaluation as pt initially agreeable to sit EOB, however then declining mobility since he does not have undergarments. Therapist ensured pt would remain covered with gown, offered to put gown on backside, and/or to keep blanket on lap. Pt declining, becoming mildly agitated and stating he is in pain. VSS on 6L O2, pt able to move all extremities WFL at bed level. Pt presenting with impairments listed below, will follow acutely. Recommend SNF at d/c pending pt progress.      Recommendations for follow up therapy are one component of a multi-disciplinary discharge planning process, led by the attending physician.  Recommendations may be updated based on patient status, additional functional criteria and insurance authorization.   Follow Up Recommendations  Skilled nursing-short term rehab (<3 hours/day)    Assistance Recommended at Discharge Frequent or constant Supervision/Assistance  Patient can return home with the following A lot of help with walking and/or transfers;A lot of help with bathing/dressing/bathroom;Assistance with cooking/housework;Direct supervision/assist for medications management;Help with stairs or ramp for entrance;Assist for transportation;Direct supervision/assist for financial management    Functional Status Assessment  Patient has had a  recent decline in their functional status and demonstrates the ability to make significant improvements in function in a reasonable and predictable amount of time.  Equipment Recommendations  None recommended by OT;Other (comment) (defer to next venue of care)    Recommendations for Other Services PT consult     Precautions / Restrictions Precautions Precautions: Fall Restrictions Weight Bearing Restrictions: No      Mobility Bed Mobility               General bed mobility comments: pt declining, however RN reporting pt able to roll R/L for bedpan placement    Transfers                   General transfer comment: pt declining      Balance Overall balance assessment: History of Falls                                         ADL either performed or assessed with clinical judgement   ADL Overall ADL's : Needs assistance/impaired Eating/Feeding: Set up;Sitting;Bed level   Grooming: Sitting;Moderate assistance   Upper Body Bathing: Moderate assistance;Sitting   Lower Body Bathing: Maximal assistance;Sitting/lateral leans   Upper Body Dressing : Moderate assistance;Sitting   Lower Body Dressing: Maximal assistance;Sitting/lateral leans   Toilet Transfer: Maximal assistance;BSC/3in1;Stand-pivot   Toileting- Clothing Manipulation and Hygiene: Maximal assistance Toileting - Clothing Manipulation Details (indicate cue type and reason): catheter     Functional mobility during ADLs: Maximal assistance       Vision   Vision Assessment?: No apparent visual deficits     Perception     Praxis      Pertinent Vitals/Pain Pain Assessment  Pain Assessment: Faces Pain Score: 2  Faces Pain Scale: Hurts a little bit Pain Location: generalized, reports BLE and BUE Pain Descriptors / Indicators: Discomfort Pain Intervention(s): Limited activity within patient's tolerance, Monitored during session     Hand Dominance Right    Extremity/Trunk Assessment Upper Extremity Assessment Upper Extremity Assessment: Generalized weakness   Lower Extremity Assessment Lower Extremity Assessment: Generalized weakness   Cervical / Trunk Assessment Cervical / Trunk Assessment: Kyphotic   Communication Communication Communication: HOH   Cognition Arousal/Alertness: Awake/alert Behavior During Therapy: WFL for tasks assessed/performed Overall Cognitive Status: Difficult to assess                                 General Comments: Cognition appears intact. Appropriate. Following commands, becoming slightly agitated with sitting EOB/mobility     General Comments  VSS on 6LO2 during session    Exercises Exercises: General Upper Extremity, General Lower Extremity General Exercises - Upper Extremity Shoulder Flexion: AROM, Both, Supine General Exercises - Lower Extremity Heel Slides: AROM, Both, Supine   Shoulder Instructions      Home Living Family/patient expects to be discharged to::  (ILF)                             Home Equipment: Rolling Walker (2 wheels)   Additional Comments: [place]      Prior Functioning/Environment Prior Level of Function : Independent/Modified Independent             Mobility Comments: Pt reports amb to/from dining room with RW. Unsure if he is a good historian. Cognition appears intact. 7 falls in the past per pt ADLs Comments: reports washing up at the sink due to recent falls        OT Problem List: Decreased strength;Decreased range of motion;Decreased activity tolerance;Impaired balance (sitting and/or standing);Decreased safety awareness      OT Treatment/Interventions: Self-care/ADL training;Therapeutic exercise;Therapeutic activities;Patient/family education;Balance training;DME and/or AE instruction;Energy conservation    OT Goals(Current goals can be found in the care plan section) Acute Rehab OT Goals Patient Stated Goal: none  stated OT Goal Formulation: With patient Time For Goal Achievement: 11/28/21 Potential to Achieve Goals: Good ADL Goals Pt Will Perform Grooming: with min assist;sitting Pt Will Perform Upper Body Dressing: with mod assist;standing;sitting Pt Will Perform Lower Body Dressing: with mod assist;sitting/lateral leans;sit to/from stand Pt Will Transfer to Toilet: with mod assist;bedside commode;stand pivot transfer Pt Will Perform Tub/Shower Transfer: Shower transfer;Tub transfer;rolling walker;shower seat;ambulating;3 in 1  OT Frequency: Min 2X/week    Co-evaluation              AM-PAC OT "6 Clicks" Daily Activity     Outcome Measure Help from another person eating meals?: None Help from another person taking care of personal grooming?: A Lot Help from another person toileting, which includes using toliet, bedpan, or urinal?: A Lot Help from another person bathing (including washing, rinsing, drying)?: A Lot Help from another person to put on and taking off regular upper body clothing?: A Lot Help from another person to put on and taking off regular lower body clothing?: A Lot 6 Click Score: 14   End of Session Equipment Utilized During Treatment: Oxygen Nurse Communication: Mobility status  Activity Tolerance: Other (comment) (Treatment limited to pt declining) Patient left: in bed;with call bell/phone within reach;with bed alarm set  OT Visit Diagnosis: Unsteadiness on  feet (R26.81);Other abnormalities of gait and mobility (R26.89);Muscle weakness (generalized) (M62.81)                Time: 1093-2355 OT Time Calculation (min): 22 min Charges:  OT General Charges $OT Visit: 1 Visit OT Evaluation $OT Eval Low Complexity: 1 Low  Lynnda Child, OTD, OTR/L Acute Rehab [phone] - [place] 11/14/2021, 4:05 PM

## 2021-11-14 NOTE — Plan of Care (Signed)

## 2021-11-15 ENCOUNTER — Inpatient Hospital Stay (HOSPITAL_COMMUNITY): Payer: Medicare Other

## 2021-11-15 DIAGNOSIS — J189 Pneumonia, unspecified organism: Secondary | ICD-10-CM | POA: Diagnosis not present

## 2021-11-15 LAB — BRAIN NATRIURETIC PEPTIDE: B Natriuretic Peptide: 532.4 pg/mL — ABNORMAL HIGH (ref 0.0–100.0)

## 2021-11-15 LAB — CBC
HCT: 40.5 % (ref 39.0–52.0)
Hemoglobin: 13 g/dL (ref 13.0–17.0)
MCH: 32.7 pg (ref 26.0–34.0)
MCHC: 32.1 g/dL (ref 30.0–36.0)
MCV: 101.8 fL — ABNORMAL HIGH (ref 80.0–100.0)
Platelets: 157 10*3/uL (ref 150–400)
RBC: 3.98 MIL/uL — ABNORMAL LOW (ref 4.22–5.81)
RDW: 14.9 % (ref 11.5–15.5)
WBC: 9.7 10*3/uL (ref 4.0–10.5)
nRBC: 0 % (ref 0.0–0.2)

## 2021-11-15 LAB — MAGNESIUM: Magnesium: 2 mg/dL (ref 1.7–2.4)

## 2021-11-15 LAB — COMPREHENSIVE METABOLIC PANEL
ALT: 17 U/L (ref 0–44)
AST: 32 U/L (ref 15–41)
Albumin: 2.6 g/dL — ABNORMAL LOW (ref 3.5–5.0)
Alkaline Phosphatase: 55 U/L (ref 38–126)
Anion gap: 8 (ref 5–15)
BUN: 21 mg/dL (ref 8–23)
CO2: 28 mmol/L (ref 22–32)
Calcium: 8.2 mg/dL — ABNORMAL LOW (ref 8.9–10.3)
Chloride: 104 mmol/L (ref 98–111)
Creatinine, Ser: 1.01 mg/dL (ref 0.61–1.24)
GFR, Estimated: >60 mL/min (ref >60)
Glucose, Bld: 92 mg/dL (ref 70–99)
Potassium: 4.1 mmol/L (ref 3.5–5.1)
Sodium: 140 mmol/L (ref 135–145)
Total Bilirubin: 0.5 mg/dL (ref 0.3–1.2)
Total Protein: 5.5 g/dL — ABNORMAL LOW (ref 6.5–8.1)

## 2021-11-15 LAB — GLUCOSE, CAPILLARY
Glucose-Capillary: 97 mg/dL (ref 70–99)
Glucose-Capillary: 83 mg/dL (ref 70–99)
Glucose-Capillary: 144 mg/dL — ABNORMAL HIGH (ref 70–99)
Glucose-Capillary: 115 mg/dL — ABNORMAL HIGH (ref 70–99)

## 2021-11-15 LAB — C-REACTIVE PROTEIN: CRP: 14.4 mg/dL — ABNORMAL HIGH (ref <1.0)

## 2021-11-15 MED ORDER — HYDRALAZINE HCL 50 MG PO TABS
50.0000 mg | ORAL_TABLET | Freq: Three times a day (TID) | ORAL | Status: DC
  Administered 2021-11-15 – 2021-11-16 (×4): 50 mg via ORAL
  Filled 2021-11-15 (×4): qty 1

## 2021-11-15 MED ORDER — AMLODIPINE BESYLATE 5 MG PO TABS
5.0000 mg | ORAL_TABLET | Freq: Every day | ORAL | Status: DC
  Administered 2021-11-15 – 2021-11-16 (×2): 5 mg via ORAL
  Filled 2021-11-15 (×2): qty 1

## 2021-11-15 MED ORDER — HYDRALAZINE HCL 20 MG/ML IJ SOLN: 10.0000 mg | Freq: Four times a day (QID) | INTRAMUSCULAR | Status: DC | PRN

## 2021-11-15 MED ORDER — FUROSEMIDE 10 MG/ML IJ SOLN
40.0000 mg | Freq: Once | INTRAMUSCULAR | Status: AC
  Administered 2021-11-15: 40 mg via INTRAVENOUS
  Filled 2021-11-15: qty 4

## 2021-11-15 NOTE — Progress Notes (Addendum)
PROGRESS NOTE        PATIENT DETAILS Name: Robert Beltran Age: 88 y.o. Sex: male Date of Birth: May 07, 1924 Admit Date: 11/12/2021 Admitting Physician Rhetta Mura, DO BJY:NWGNFA, Elta Guadel[place], MD  Brief Summary: Patient is a 88 y.o.  male with DM-2, HTN, HLD-who presented from his assisted living facility for evaluation of a cough/exertional dyspnea and a mechanical fall-upon further evaluation-he was found to have sepsis due to multifocal pneumonia and pulmonary embolism.    Significant events: 5/18>> admit to [facility]-sepsis for PNA-small PE  Significant studies: 5/18>> CXR: Bibasilar atelectasis 5/18>> x-ray pelvis: No fracture 5/18>> x-ray right knee: No fracture/dislocation/joint effusion. 5/18>> x-ray left knee: No fracture/dislocation/joint effusion 5/18>> CT head: No acute intracranial process. 5/18>> CT C-spine: No fracture/dislocation-multilevel spondylosis/facet hypertrophy. 5/18>> CTA chest: Isolated subsegmental right upper lobe pulmonary emboli-multifocal PNA.  Significant microbiology data: 5/18>> COVID/influenza PCR: Negative 5/18>> blood culture: No growth  Procedures:  CTA - Isolated subsegmental right upper lobe pulmonary embolism. Overall clot burden is very small. Multifocal pneumonia, lower lobe predominant.   CT Head - Non acute  Leg [place] - No DVT  Echo -  1. Left ventricular ejection fraction, by estimation, is 50 to 55%. The left ventricle has low normal function. The left ventricle has no regional wall motion abnormalities. Left ventricular diastolic parameters were normal.  2. Right ventricular systolic function is normal. The right ventricular size is normal. There is normal pulmonary artery systolic pressure.  3. The mitral valve is abnormal. Trivial mitral valve regurgitation. No evidence of mitral stenosis.  4. The aortic valve is tricuspid. There is moderate calcification of the aortic valve. There is moderate thickening of the  aortic valve. Aortic valve regurgitation is mild. Aortic valve sclerosis/calcification is present, without any evidence of aortic stenosis.  5. Aortic dilatation noted. There is mild dilatation of the ascending aorta, measuring 38 mm.  6. The inferior vena cava is normal in size with greater than 50% respiratory variability, suggesting right atrial pressure of 3 mmHg.  Consults:  None   Subjective:  Patient in bed, appears comfortable, denies any headache, no fever, no chest pain or pressure, no shortness of breath , no abdominal pain. No new focal weakness.    Objective: Vitals: Blood pressure (!) 153/82, pulse 91, temperature 97.9 F (36.6 C), temperature source Oral, resp. rate 20, height '5\' 2"'$  (1.575 m), weight 69.6 kg, SpO2 94 %.   Exam:  Awake Alert, No new F.N deficits, Normal affect [place].AT,PERRAL Supple Neck, No JVD,   Symmetrical Chest wall movement, Good air movement bilaterally, CTAB RRR,No Gallops, Rubs or new Murmurs,  +ve B.Sounds, Abd Soft, No tenderness,   No Cyanosis, Clubbing or edema    Assessment/Plan:  Severe sepsis due to multifocal pneumonia with acute hypoxic respiratory failure: Likely aspiration PNA-sepsis physiology has resolved continue Unasyn x 5 days, speech therapy following.  Continue feeding assistance and aspiration precautions, continue antibiotics.  Overall weak and frail will require placement.  Added I-S and flutter valve for pulmonary toiletry, advance activity and titrate down oxygen as tolerated.  Acute sided pulmonary embolism: On full dose Lovenox switch to Eliquis on 11/15/2021  Chronic HFpEF EF 55% : Euvolemic, prn lasix.  HTN: Start on low-dose Norvasc and hydralazine, monitor.  HLD: Continue statin  CKD stage IIIa: Close to baseline-follow electrolytes periodically.  Mechanical fall: Imaging negative for fractures-see  above-obtaining PT/OT.  May require SNF.  Palliative care: Elderly patient with decent quality of life for his age,  discussed with son for now full code.  DM-2 (A1c 6.3 on 5/19): Stable on SSI-resume metformin on discharge.  Lab Results  Component Value Date   HGBA1C 6.3 (H) 11/13/2021   CBG (last 3)  Recent Labs    11/14/21 1559 11/14/21 2059 11/15/21 0733  GLUCAP 79 135* 97     BMI: Estimated body mass index is 28.06 kg/m as calculated from the following:   Height as of this encounter: '5\' 2"'$  (1.575 m).   Weight as of this encounter: 69.6 kg.   Code status:   Code Status: Full Code   DVT Prophylaxis: SCDs Start: 11/12/21 2352 apixaban (ELIQUIS) tablet 10 mg  apixaban (ELIQUIS) tablet 5 mg IV heparin  Family Communication:   Son Cj Edgell 11/14/21   Disposition Plan: Status is: Inpatient Remains inpatient appropriate because: PNA/PE-on IV antibiotics-anticoagulation-still on oxygen-not yet stable for discharge.   Planned Discharge Destination:Skilled nursing facility   Diet: Diet Order             Diet regular Room service appropriate? Yes; Fluid consistency: Thin  Diet effective now                     Antimicrobial agents: Anti-infectives (From admission, onward)    Start     Dose/Rate Route Frequency Ordered Stop   11/13/21 1300  Ampicillin-Sulbactam (UNASYN) 3 g in sodium chloride 0.9 % 100 mL IVPB        3 g 200 mL/hr over 30 Minutes Intravenous Every 8 hours 11/13/21 1203     11/13/21 0600  piperacillin-tazobactam (ZOSYN) IVPB 3.375 g  Status:  Discontinued        3.375 g 12.5 mL/hr over 240 Minutes Intravenous Every 8 hours 11/13/21 0009 11/13/21 1118   11/12/21 2345  piperacillin-tazobactam (ZOSYN) IVPB 3.375 g        3.375 g 100 mL/hr over 30 Minutes Intravenous  Once 11/12/21 2332 11/13/21 0121        MEDICATIONS: Scheduled Meds:  amLODipine  5 mg Oral Daily   apixaban  10 mg Oral BID   Followed by   Derrill Memo ON 11/22/2021] apixaban  5 mg Oral BID   escitalopram  5 mg Oral Daily   insulin aspart  0-6 Units Subcutaneous TID  WC   levothyroxine  75 mcg Oral QAC breakfast   rosuvastatin  20 mg Oral Daily   Continuous Infusions:  ampicillin-sulbactam (UNASYN) IV 3 g (11/15/21 0557)   PRN Meds:.acetaminophen **OR** acetaminophen, hydrALAZINE   I have personally reviewed following labs and imaging studies  LABORATORY DATA:  Recent Labs  Lab 11/12/21 2110 11/13/21 0411 11/14/21 0135 11/15/21 0043  WBC 17.0* 16.1* 12.3* 9.7  HGB 14.3 14.0 12.4* 13.0  HCT 43.5 43.1 38.7* 40.5  PLT 144* 163 147* 157  MCV 103.6* 101.2* 102.1* 101.8*  MCH 34.0 32.9 32.7 32.7  MCHC 32.9 32.5 32.0 32.1  RDW 15.1 15.2 15.2 14.9  LYMPHSABS 0.3* 0.2*  --   --   MONOABS 0.6 0.5  --   --   EOSABS 0.0 0.0  --   --   BASOSABS 0.0 0.0  --   --     Recent Labs  Lab 11/12/21 2008 11/12/21 2030 11/12/21 2055 11/12/21 2218 11/12/21 2357 11/13/21 0134 11/13/21 0411 11/13/21 4034 11/14/21 0135 11/14/21 0623 11/15/21 0043  NA  --   --  136  --   --   --  139  --  137  --  140  K  --   --  5.1  --   --   --  4.9  --  4.9  --  4.1  CL  --   --  103  --   --   --  102  --  103  --  104  CO2  --   --  22  --   --   --  30  --  27  --  28  GLUCOSE  --   --  239*  --   --   --  170*  --  140*  --  92  BUN  --   --  17  --   --   --  18  --  21  --  21  CREATININE  --   --  1.04  --   --   --  1.05  --  0.93  --  1.01  CALCIUM  --   --  8.6*  --   --   --  8.5*  --  8.0*  --  8.2*  AST  --   --   --   --   --   --  21  --   --   --  32  ALT  --   --   --   --   --   --  16  --   --   --  17  ALKPHOS  --   --   --   --   --   --  55  --   --   --  55  BILITOT  --   --   --   --   --   --  0.7  --   --   --  0.5  ALBUMIN  --   --   --   --   --   --  2.8*  --   --   --  2.6*  MG  --   --   --  1.8  --   --  1.8  --   --   --  2.0  PHOS  --   --   --   --   --   --  3.4  --   --   --   --   CRP  --   --   --   --   --   --   --   --   --  14.9* 14.4*  DDIMER 7.66*  --   --   --   --   --   --   --   --   --   --   PROCALCITON   --   --   --   --   --   --  0.91  --   --   --   --   LATICACIDVEN  --   --   --   --  2.3* 2.6*  --  2.7*  --   --   --   HGBA1C  --   --   --   --   --   --  6.3*  --   --   --   --   BNP  --  205.6*  --   --   --   --   --   --   --  304.5* 532.4*         RADIOLOGY STUDIES/RESULTS: DG Chest Port 1 View  Result Date: 11/15/2021 CLINICAL DATA:  88 year old male with history of multifocal pneumonia. EXAM: PORTABLE CHEST 1 VIEW COMPARISON:  Chest x-ray 11/12/2021. FINDINGS: Lung volumes are low. Patchy ill-defined opacities are noted throughout the perihilar aspects of the lungs and medial aspects of the lower lobes of the lungs bilaterally (right greater than left). No definite pleural effusions. No pneumothorax. No evidence of pulmonary edema. Heart size appears normal. Mediastinal contours are distorted by patient positioning. Atherosclerotic calcifications in the thoracic aorta. IMPRESSION: 1. Low lung volumes with multilobar bilateral bronchopneumonia, worsened compared to the prior examination, as above. 2. Aortic atherosclerosis. Electronically Signed   By: Vinnie Langton M.D.   On: 11/15/2021 07:25   DG Swallowing Func-Speech Pathology  Result Date: 11/13/2021 Table formatting from the original result was not included. Objective Swallowing Evaluation: Type of Study: MBS-Modified Barium Swallow Study  Patient Details Name: Robert Beltran MRN: [id number] Date of Birth: [birth year] Today's Date: 11/13/2021 Time: SLP Start Time (ACUTE ONLY): 1450 -SLP Stop Time (ACUTE ONLY): 1515 SLP Time Calculation (min) (ACUTE ONLY): 25 min Past Medical History: Past Medical History: Diagnosis Date  Arthritis   Benign prostate hyperplasia   Cancer ([place])   testicular cancer 1964  Chronic back pain   Depression   Diabetes mellitus without complication (HCC)   DJD (degenerative joint disease)   GERD (gastroesophageal reflux disease)   HLD (hyperlipidemia)   Hypertension   sees Dr. Crist Infante,  Past Surgical  History: Past Surgical History: Procedure Laterality Date  APPENDECTOMY    1995  EYE SURGERY    bilateral surgery  LUMBAR LAMINECTOMY/DECOMPRESSION MICRODISCECTOMY N/A 08/29/2012  Procedure: LUMBAR LAMINECTOMY/DECOMPRESSION MICRODISCECTOMY 2 LEVELS;  Surgeon: Eustace Moore, MD;  Location: [facility];  Service: Neurosurgery;  Laterality: N/A;  SHOULDER SURGERY    right HPI: Pt is a 88 y.o. male who presented to the ED after fall and was admitted 11/12/2021 with severe sepsis due to multifocal pneumonia. Pt reportedly vomited during CT scan while supine. CT chest 5/18: Multifocal patchy opacities in the lungs bilaterally, lower lobe predominant, suspicious for pneumonia. Superimposed bilateral lower lobe atelectasis is possible. PMH: type 2 diabetes mellitus, essential hypertension, GERD, hyperlipidemia. MBS 06/04/21 revealed esophageal stasis, but a functional oropharyngeal swallow. A regular texture diet with thin liquids (via cup) was recommended as well as an esophagram due to noted stasis. No subsequent esophagram noted in pt's chart.  No data recorded  Recommendations for follow up therapy are one component of a multi-disciplinary discharge planning process, led by the attending physician.  Recommendations may be updated based on patient status, additional functional criteria and insurance authorization. Assessment / Plan / Recommendation   11/13/2021   4:12 PM Clinical Impressions Clinical Impression Cervical osteophytes noted C4-6 with moderate anterior protrusion into the pharynx. Pt's overall pharyngeal swallow appears worse compared to the study on 06/04/21. Pt presents with pharyngeal dsyphagia characterized by a phrayngeal delay, as well as reduction in tongue base retraction, pharyngeal constriction, and anterior laryngeal movement.  He demonstrated vallecular residue, posterior pharyngeal wall residue, and pyriform sinus residue. With large boluses of thin liquids, pt appeared to have difficulty maintaining  laryngeal closure, and generating enough pressure to facilitate transit of these boluses past the point of the cervical osteophytes. Penetration (PAS 3, 5) and aspiration (PAS 7, 8) were therefore noted with large boluses of thin liquids via straw and with consecutive swallows of  thin liquids via cup. Throat clearing and a slight delayed cough were inconsistently noted following apsiration. Prompted coughing was ineffective in expelling aspirate, but did mobilize material within the larynx. No functional benefit was noted with a chin tuck posture which increased laryngeal invasion and episodes of aspiration. Pt independently reduced bolus sizes with nectar thick liquids via cup and no laryngeal invasion was noted therewith. Penetration and aspiration were eliminated with individual, smaller boluses of thin liquids via cup. Secondary swallows were effective in reducing pharyngeal residue to a functional level. Pt's current diet of regular texture solids and thin liquids will be continued with strict observance of swallowing precautions. SLP will follow to ensure tolerance for dysphagia treatment. SLP Visit Diagnosis Dysphagia, pharyngeal phase (R13.13) Impact on safety and function Mild aspiration risk     11/13/2021   4:12 PM Treatment Recommendations Treatment Recommendations Therapy as outlined in treatment plan below     11/13/2021   4:12 PM Prognosis Prognosis for Safe Diet Advancement Good Barriers to Reach Goals Cognitive deficits   11/13/2021   4:12 PM Diet Recommendations SLP Diet Recommendations Regular solids;Thin liquid Liquid Administration via Cup;No straw Medication Administration Whole meds with puree Compensations Slow rate;Small sips/bites;Multiple dry swallows after each bite/sip Postural Changes Seated upright at 90 degrees     11/13/2021   4:12 PM Other Recommendations Oral Care Recommendations Oral care BID Follow Up Recommendations Skilled nursing-short term rehab (<3 hours/day) Functional Status  Assessment Patient has had a recent decline in their functional status and demonstrates the ability to make significant improvements in function in a reasonable and predictable amount of time.   11/13/2021   4:12 PM Frequency and Duration  Speech Therapy Frequency (ACUTE ONLY) min 2x/week Treatment Duration 2 weeks     11/13/2021   4:12 PM Oral Phase Oral Phase [facility]    11/13/2021   4:12 PM Pharyngeal Phase Pharyngeal Phase Impaired Pharyngeal- Nectar Cup Delayed swallow initiation-vallecula;Delayed swallow initiation-pyriform sinuses;Reduced pharyngeal peristalsis;Pharyngeal residue - pyriform;Pharyngeal residue - posterior pharnyx;Pharyngeal residue - valleculae;Reduced anterior laryngeal mobility;Reduced tongue base retraction Pharyngeal- Thin Cup Delayed swallow initiation-vallecula;Delayed swallow initiation-pyriform sinuses;Reduced pharyngeal peristalsis;Pharyngeal residue - pyriform;Pharyngeal residue - posterior pharnyx;Pharyngeal residue - valleculae;Reduced anterior laryngeal mobility;Reduced tongue base retraction;Penetration/Aspiration during swallow Pharyngeal Material enters airway, remains ABOVE vocal cords and not ejected out;Material enters airway, CONTACTS cords and not ejected out;Material enters airway, passes BELOW cords and not ejected out despite cough attempt by patient;Material enters airway, passes BELOW cords without attempt by patient to eject out (silent aspiration) Pharyngeal- Thin Straw Delayed swallow initiation-vallecula;Delayed swallow initiation-pyriform sinuses;Reduced pharyngeal peristalsis;Pharyngeal residue - pyriform;Pharyngeal residue - posterior pharnyx;Pharyngeal residue - valleculae;Reduced anterior laryngeal mobility;Reduced tongue base retraction;Penetration/Aspiration during swallow;Penetration/Apiration after swallow Pharyngeal Material enters airway, passes BELOW cords and not ejected out despite cough attempt by patient;Material enters airway, passes BELOW cords without  attempt by patient to eject out (silent aspiration) Pharyngeal- Puree Delayed swallow initiation-vallecula;Delayed swallow initiation-pyriform sinuses;Reduced pharyngeal peristalsis;Pharyngeal residue - pyriform;Pharyngeal residue - posterior pharnyx;Pharyngeal residue - valleculae;Reduced anterior laryngeal mobility;Reduced tongue base retraction Pharyngeal- Regular Delayed swallow initiation-vallecula;Delayed swallow initiation-pyriform sinuses;Reduced pharyngeal peristalsis;Pharyngeal residue - pyriform;Pharyngeal residue - posterior pharnyx;Pharyngeal residue - valleculae;Reduced anterior laryngeal mobility;Reduced tongue base retraction Pharyngeal- Pill Delayed swallow initiation-vallecula;Delayed swallow initiation-pyriform sinuses;Reduced pharyngeal peristalsis;Pharyngeal residue - pyriform;Pharyngeal residue - posterior pharnyx;Pharyngeal residue - valleculae;Reduced anterior laryngeal mobility;Reduced tongue base retraction;Penetration/Aspiration during swallow;Penetration/Aspiration before swallow Pharyngeal Material enters airway, passes BELOW cords without attempt by patient to eject out (silent aspiration)    11/13/2021   4:12 PM Cervical Esophageal  Phase  Cervical Esophageal Phase WFL (mild stasis noted in the distal esophagus) Shanika I. Hardin Negus, [place], [place] Office number [phone] Pager [phone] Horton Marshall 11/13/2021, 4:33 PM                     ECHOCARDIOGRAM COMPLETE  Result Date: 11/13/2021    ECHOCARDIOGRAM REPORT   Patient Name:   Robert Beltran Date of Exam: 11/13/2021 Medical Rec #:  [id number]      Height:       62.0 in Accession #:    [id number]     Weight:       157.6 lb Date of Birth:  1923/11/05      BSA:          1.728 m Patient Age:    89 years       BP:           123/45 mmHg Patient Gender: M              HR:           69 bpm. Exam Location:  Inpatient Procedure: 2D Echo, Cardiac Doppler and Color Doppler Indications:    Pulmonary Embolus  I26.09  History:        Patient has prior history of Echocardiogram examinations, most                 recent 08/25/2012. Risk Factors:Hypertension, Dyslipidemia, GERD                 and Diabetes.  Sonographer:    Bernadene Person RDCS Referring Phys: [place]  1. Left ventricular ejection fraction, by estimation, is 50 to 55%. The left ventricle has low normal function. The left ventricle has no regional wall motion abnormalities. Left ventricular diastolic parameters were normal.  2. Right ventricular systolic function is normal. The right ventricular size is normal. There is normal pulmonary artery systolic pressure.  3. The mitral valve is abnormal. Trivial mitral valve regurgitation. No evidence of mitral stenosis.  4. The aortic valve is tricuspid. There is moderate calcification of the aortic valve. There is moderate thickening of the aortic valve. Aortic valve regurgitation is mild. Aortic valve sclerosis/calcification is present, without any evidence of aortic stenosis.  5. Aortic dilatation noted. There is mild dilatation of the ascending aorta, measuring 38 mm.  6. The inferior vena cava is normal in size with greater than 50% respiratory variability, suggesting right atrial pressure of 3 mmHg. FINDINGS  Left Ventricle: Abnormal septal motion inferior basal hypokinesis. Left ventricular ejection fraction, by estimation, is 50 to 55%. The left ventricle has low normal function. The left ventricle has no regional wall motion abnormalities. The left ventricular internal cavity size was normal in size. There is no left ventricular hypertrophy. Left ventricular diastolic parameters were normal. Right Ventricle: The right ventricular size is normal. No increase in right ventricular wall thickness. Right ventricular systolic function is normal. There is normal pulmonary artery systolic pressure. The tricuspid regurgitant velocity is 2.87 m/s, and  with an assumed right atrial pressure of  3 mmHg, the estimated right ventricular systolic pressure is 80.9 mmHg. Left Atrium: Left atrial size was normal in size. Right Atrium: Right atrial size was normal in size. Pericardium: There is no evidence of pericardial effusion. Mitral Valve: The mitral valve is abnormal. There is mild thickening of the mitral valve leaflet(s). There is mild calcification of the mitral valve leaflet(s). Mild mitral annular  calcification. Trivial mitral valve regurgitation. No evidence of mitral valve stenosis. Tricuspid Valve: The tricuspid valve is normal in structure. Tricuspid valve regurgitation is mild . No evidence of tricuspid stenosis. Aortic Valve: The aortic valve is tricuspid. There is moderate calcification of the aortic valve. There is moderate thickening of the aortic valve. Aortic valve regurgitation is mild. Aortic regurgitation PHT measures 455 msec. Aortic valve sclerosis/calcification is present, without any evidence of aortic stenosis. Pulmonic Valve: The pulmonic valve was normal in structure. Pulmonic valve regurgitation is not visualized. No evidence of pulmonic stenosis. Aorta: Aortic dilatation noted. There is mild dilatation of the ascending aorta, measuring 38 mm. Venous: The inferior vena cava is normal in size with greater than 50% respiratory variability, suggesting right atrial pressure of 3 mmHg. IAS/Shunts: No atrial level shunt detected by color flow Doppler.  LEFT VENTRICLE PLAX 2D LVIDd:         4.20 cm     Diastology LVIDs:         3.40 cm     LV e' medial:    3.85 cm/s LV PW:         1.00 cm     LV E/e' medial:  18.8 LV IVS:        1.00 cm     LV e' lateral:   6.73 cm/s LVOT diam:     2.00 cm     LV E/e' lateral: 10.7 LV SV:         68 LV SV Index:   40 LVOT Area:     3.14 cm  LV Volumes (MOD) LV vol d, MOD A2C: 93.1 ml LV vol d, MOD A4C: 82.0 ml LV vol s, MOD A2C: 46.8 ml LV vol s, MOD A4C: 37.8 ml LV SV MOD A2C:     46.3 ml LV SV MOD A4C:     82.0 ml LV SV MOD BP:      45.6 ml RIGHT  VENTRICLE RV S prime:     10.90 cm/s TAPSE (M-mode): 1.7 cm LEFT ATRIUM             Index        RIGHT ATRIUM           Index LA diam:        3.30 cm 1.91 cm/m   RA Area:     13.60 cm LA Vol (A2C):   44.8 ml 25.93 ml/m  RA Volume:   27.40 ml  15.86 ml/m LA Vol (A4C):   41.7 ml 24.14 ml/m LA Biplane Vol: 46.7 ml 27.03 ml/m  AORTIC VALVE LVOT Vmax:   89.40 cm/s LVOT Vmean:  66.800 cm/s LVOT VTI:    0.218 m AI PHT:      455 msec  AORTA Ao Root diam: 3.80 cm Ao Asc diam:  3.80 cm MITRAL VALVE               TRICUSPID VALVE MV Area (PHT): 4.26 cm    TR Peak grad:   32.9 mmHg MV Decel Time: 178 msec    TR Vmax:        287.00 cm/s MR PISA:        0.57 cm MR PISA Radius: 0.30 cm    SHUNTS MV E velocity: 72.30 cm/s  Systemic VTI:  0.22 m MV A velocity: 86.80 cm/s  Systemic Diam: 2.00 cm MV E/A ratio:  0.83 Jenkins Rouge MD Electronically signed by Jenkins Rouge MD Signature Date/Time: 11/13/2021/6:04:42 PM  Final    VAS [place] LOWER EXTREMITY VENOUS (DVT)  Result Date: 11/13/2021  Lower Venous DVT Study Patient Name:  DANELL VERNO  Date of Exam:   11/13/2021 Medical Rec #: [id number]       Accession #:    [id number] Date of Birth: 06-16-1924       Patient Gender: M Patient Age:   89 years Exam [facility] Procedure:      VAS [place] LOWER EXTREMITY VENOUS (DVT) Referring Phys: Oren Binet --------------------------------------------------------------------------------  Indications: Pulmonary embolism, and Swelling.  Comparison Study: No recent prior studies. Performing Technologist: Darlin Coco RDMS, RVT  Examination Guidelines: A complete evaluation includes B-mode imaging, spectral Doppler, color Doppler, and power Doppler as needed of all accessible portions of each vessel. Bilateral testing is considered an integral part of a complete examination. Limited examinations for reoccurring indications may be performed as noted. The reflux portion of the exam is performed with the patient in reverse  Trendelenburg.  +---------+---------------+---------+-----------+----------+--------------+ RIGHT    CompressibilityPhasicitySpontaneityPropertiesThrombus Aging +---------+---------------+---------+-----------+----------+--------------+ CFV      Full           Yes      Yes                                 +---------+---------------+---------+-----------+----------+--------------+ SFJ      Full                                                        +---------+---------------+---------+-----------+----------+--------------+ FV Prox  Full                                                        +---------+---------------+---------+-----------+----------+--------------+ FV Mid   Full                                                        +---------+---------------+---------+-----------+----------+--------------+ FV DistalFull                                                        +---------+---------------+---------+-----------+----------+--------------+ PFV      Full                                                        +---------+---------------+---------+-----------+----------+--------------+ POP      Full           Yes      Yes                                 +---------+---------------+---------+-----------+----------+--------------+ PTV  Full                                                        +---------+---------------+---------+-----------+----------+--------------+ PERO     Full                                                        +---------+---------------+---------+-----------+----------+--------------+   +---------+---------------+---------+-----------+----------+--------------+ LEFT     CompressibilityPhasicitySpontaneityPropertiesThrombus Aging +---------+---------------+---------+-----------+----------+--------------+ CFV      Full           Yes      Yes                                  +---------+---------------+---------+-----------+----------+--------------+ SFJ      Full                                                        +---------+---------------+---------+-----------+----------+--------------+ FV Prox  Full                                                        +---------+---------------+---------+-----------+----------+--------------+ FV Mid   Full                                                        +---------+---------------+---------+-----------+----------+--------------+ FV DistalFull                                                        +---------+---------------+---------+-----------+----------+--------------+ PFV      Full                                                        +---------+---------------+---------+-----------+----------+--------------+ POP      Full           Yes      Yes                                 +---------+---------------+---------+-----------+----------+--------------+ PTV      Full                                                        +---------+---------------+---------+-----------+----------+--------------+  PERO     Full                                                        +---------+---------------+---------+-----------+----------+--------------+     Summary: RIGHT: - There is no evidence of deep vein thrombosis in the lower extremity.  - No cystic structure found in the popliteal fossa.  LEFT: - There is no evidence of deep vein thrombosis in the lower extremity.  - No cystic structure found in the popliteal fossa.  *See table(s) above for measurements and observations. Electronically signed by Jamelle Haring on 11/13/2021 at 5:25:31 PM.    Final      LOS: 3 days   Signature  Lala Lund M.D on 11/15/2021 at 10:43 AM   -  To page go to [email or web address]

## 2021-11-16 DIAGNOSIS — J189 Pneumonia, unspecified organism: Secondary | ICD-10-CM | POA: Diagnosis not present

## 2021-11-16 LAB — GLUCOSE, CAPILLARY
Glucose-Capillary: 122 mg/dL — ABNORMAL HIGH (ref 70–99)
Glucose-Capillary: 177 mg/dL — ABNORMAL HIGH (ref 70–99)

## 2021-11-16 MED ORDER — APIXABAN 5 MG PO TABS: 10.0000 mg | ORAL_TABLET | Freq: Two times a day (BID) | ORAL | Status: DC

## 2021-11-16 MED ORDER — APIXABAN 5 MG PO TABS: 5.0000 mg | ORAL_TABLET | Freq: Two times a day (BID) | ORAL | Status: DC

## 2021-11-16 NOTE — Care Management Important Message (Signed)
Important Message  Patient Details  Name: Robert Beltran MRN: [id number] Date of Birth: 01-04-1924   Medicare Important Message Given:  Yes     Gerren Hoffmeier Montine Circle 11/16/2021, 3:53 PM

## 2021-11-16 NOTE — TOC Transition Note (Signed)
Transition of Care [facility]) - CM/SW Discharge Note   Patient Details  Name: Robert Beltran MRN: [id number] Date of Birth: Nov 06, 1923  [facility] Care [facility]) CM/SW Contact:  Benard Halsted, LCSW Phone Number: 11/16/2021, 1:29 PM   Clinical Narrative:    Patient will DC to: [facility] Anticipated DC date: 11/16/21 Family notified: Ajdin, Macke. Transport by: Corey Harold   Per MD patient ready for DC to [facility]. RN to call report prior to discharge [phone] room 609A). RN, patient, patient's family, and facility notified of DC. Discharge Summary and FL2 sent to facility. DC packet on chart. Ambulance transport requested for patient.   CSW will sign off for now as social work intervention is no longer needed. Please consult [place] again if new needs arise.     Final next level of care: Skilled Nursing Facility Barriers to Discharge: Barriers Resolved   Patient Goals and CMS Choice Patient states their goals for this hospitalization and ongoing recovery are:: Rehab CMS Medicare.gov Compare Post Acute Care list provided to:: Patient Choice offered to / list presented to : Patient, Adult Children  Discharge Placement   Existing PASRR number confirmed : 11/16/21          Patient chooses bed at: [facility] Patient to be transferred to facility by: [facility] Name of family member notified: Trentin, son Patient and family notified of of transfer: 11/16/21  Discharge Plan and Services In-house Referral: Clinical Social Work   Post Acute Care Choice: [place]                               Social Determinants of Health (SDOH) Interventions     Readmission Risk Interventions     View : No data to display.

## 2021-11-16 NOTE — TOC Progression Note (Signed)
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 Plan Auth ID# [id number], effective 11/16/2021-11/18/2021.   Expected Discharge Plan: [place] Barriers to Discharge: Barriers Resolved  Expected Discharge Plan and Services Expected Discharge Plan: [place] In-house Referral: Clinical Social Work   Post Acute Care Choice: [place] Living arrangements for the past 2 months: [place]                                       Social Determinants of Health (SDOH) Interventions    Readmission Risk Interventions     View : No data to display.

## 2021-11-16 NOTE — Care Management Important Message (Signed)
Important Message  Patient Details  Name: Robert Beltran MRN: [id number] Date of Birth: [birth year]   Medicare Important Message Given:  Yes  Patient left prior to IM delivery will mail to the patient home address.    Farren Nelles 11/16/2021, 4:35 PM

## 2021-11-16 NOTE — Discharge Summary (Signed)
Robert Beltran:[id number] DOB: Jul 07, 1923 DOA: 11/12/2021  PCP: Crist Infante, MD  Admit date: 11/12/2021  Discharge date: 11/16/2021  Admitted From: ALF   Disposition:  SNF   Recommendations for Outpatient Follow-up:   Follow up with PCP in 1-2 weeks  PCP Please obtain BMP/CBC, 2 view CXR in 1week,  (see Discharge instructions)   PCP Please follow up on the following pending results:    Home Health: None   Equipment/Devices: None  Consultations: None  Discharge Condition: Stable    CODE STATUS: Full    Diet Recommendation: Heart Healthy     Chief Complaint  Patient presents with   Fall     Brief history of present illness from the day of admission and additional interim summary    88 y.o.  male with DM-2, HTN, HLD-who presented from his assisted living facility for evaluation of a cough/exertional dyspnea and a mechanical fall-upon further evaluation-he was found to have sepsis due to multifocal pneumonia and pulmonary embolism.     Significant events: 5/18>> admit to [facility]-sepsis for PNA-small PE   Significant studies: 5/18>> CXR: Bibasilar atelectasis 5/18>> x-ray pelvis: No fracture 5/18>> x-ray right knee: No fracture/dislocation/joint effusion. 5/18>> x-ray left knee: No fracture/dislocation/joint effusion 5/18>> CT head: No acute intracranial process. 5/18>> CT C-spine: No fracture/dislocation-multilevel spondylosis/facet hypertrophy. 5/18>> CTA chest: Isolated subsegmental right upper lobe pulmonary emboli-multifocal PNA.   Significant microbiology data: 5/18>> COVID/influenza PCR: Negative 5/18>> blood culture: No growth   Procedures:   CTA - Isolated subsegmental right upper lobe pulmonary embolism. Overall clot burden is very small. Multifocal pneumonia, lower lobe predominant.     CT Head - Non acute   Leg [place] - No DVT   Echo -  1. Left ventricular ejection fraction, by estimation, is 50 to 55%. The left ventricle has low normal function. The left ventricle has no regional wall motion abnormalities. Left ventricular diastolic parameters were normal.  2. Right ventricular systolic function is normal. The right ventricular size is normal. There is normal pulmonary artery systolic pressure.  3. The mitral valve is abnormal. Trivial mitral valve regurgitation. No evidence of mitral stenosis.  4. The aortic valve is tricuspid. There is moderate calcification of the aortic valve. There is moderate thickening of the aortic valve. Aortic valve regurgitation is mild. Aortic valve sclerosis/calcification is present, without any evidence of aortic stenosis.  5. Aortic dilatation noted. There is mild dilatation of the ascending aorta, measuring 38 mm.  6. The inferior vena cava is normal in size with greater than 50% respiratory variability, suggesting right atrial pressure of 3 mmHg.                                                                   Hospital Course    Severe sepsis due  to multifocal pneumonia with acute hypoxic respiratory failure: Likely aspiration PNA-sepsis physiology has resolved continue Unasyn x 5 days, speech therapy following.  Continue feeding assistance and aspiration precautions, continue antibiotics.  Overall weak and frail will require placement.  Added I-S and flutter valve for pulmonary toiletry, advance activity, now at baseline and symptom-free on room air.  Has finished his antibiotic course.  Discharge with aspiration precautions to SNF   Acute sided pulmonary embolism: On full dose Lovenox switched to Eliquis on 11/15/2021  Chronic HFpEF EF 55% : Bulimic and compensated.  HTN: Continue home medication.  Monitor renal function closely.  HLD: Continue statin  CKD stage 2: He is close to baseline.  PCP to monitor.   Mechanical fall: Imaging negative  for fractures-see above-obtaining PT/OT.  Will be discharged to SNF at this time.   Palliative care: Elderly patient with decent quality of life for his age, discussed with son for now full code.  Overall in good health for his age.   DM-2 (A1c 6.3 on 5/19): Stable continue on metformin.   Discharge diagnosis     Principal Problem:   Multifocal pneumonia Active Problems:   Type 2 diabetes mellitus with stage 3a chronic kidney disease, without long-term current use of insulin (HCC)   Severe sepsis (HCC)   Acute respiratory failure with hypoxia (HCC)   Fall at home, initial encounter   HLD (hyperlipidemia)   Hypertension   Chronic diastolic CHF (congestive heart failure) ([place])    Discharge instructions    Discharge Instructions     Diet - low sodium heart healthy   Complete by: As directed    Discharge instructions   Complete by: As directed    Follow with Primary MD Crist Infante, MD in 7 days   Get CBC, CMP, 2 view Chest X ray -  checked next visit within 1 week by SNF MD    Activity: As tolerated with Full fall precautions use walker/cane & assistance as needed  Disposition SNF  Diet: Heart Healthy with feeding assistance and aspiration precautions.    Special Instructions: If you have smoked or chewed Tobacco  in the last 2 yrs please stop smoking, stop any regular Alcohol  and or any Recreational drug use.  On your next visit with your primary care physician please Get Medicines reviewed and adjusted.  Please request your Prim.MD to go over all Hospital Tests and Procedure/Radiological results at the follow up, please get all Hospital records sent to your Prim MD by signing hospital release before you go home.  If you experience worsening of your admission symptoms, develop shortness of breath, life threatening emergency, suicidal or homicidal thoughts you must seek medical attention immediately by calling 911 or calling your MD immediately  if symptoms less  severe.  You Must read complete instructions/literature along with all the possible adverse reactions/side effects for all the Medicines you take and that have been prescribed to you. Take any new Medicines after you have completely understood and accpet all the possible adverse reactions/side effects.   Increase activity slowly   Complete by: As directed    No wound care   Complete by: As directed        Discharge Medications   Allergies as of 11/16/2021       Reactions   Aspirin Other (See Comments)   GI bleed   Nsaids Other (See Comments)   GI distress   Other Nausea Only   Reaction to Coricidin   Bactrim [sulfamethoxazole-trimethoprim]  Rash        Medication List     TAKE these medications    acetaminophen 500 MG tablet Commonly known as: TYLENOL Take 500 mg by mouth every 6 (six) hours as needed for headache (pain).   apixaban 5 MG Tabs tablet Commonly known as: ELIQUIS Take 2 tablets (10 mg total) by mouth 2 (two) times daily for 5 days.   apixaban 5 MG Tabs tablet Commonly known as: ELIQUIS Take 1 tablet (5 mg total) by mouth 2 (two) times daily. Start taking on: Nov 22, 2021   AVO Cream Emul Apply 1 application. topically daily.   candesartan 8 MG tablet Commonly known as: ATACAND Take 8 mg by mouth every morning.   donepezil 5 MG tablet Commonly known as: ARICEPT Take 5 mg by mouth at bedtime.   ergocalciferol 1.25 MG (50000 UT) capsule Commonly known as: VITAMIN D2 Take 50,000 Units by mouth once a week.   escitalopram 5 MG tablet Commonly known as: LEXAPRO Take 5 mg by mouth every morning.   fluticasone 50 MCG/ACT nasal spray Commonly known as: FLONASE Place 1 spray into both nostrils daily as needed for allergies or rhinitis (seasonal allergies).   levothyroxine 75 MCG tablet Commonly known as: SYNTHROID Take 75 mcg by mouth daily before breakfast.   metFORMIN 500 MG tablet Commonly known as: GLUCOPHAGE Take 500 mg by mouth daily  with breakfast.   multivitamin with minerals Tabs tablet Take 1 tablet by mouth every morning.   polyethylene glycol 17 g packet Commonly known as: MIRALAX / GLYCOLAX Take 17 g by mouth daily as needed (constipation).   rosuvastatin 20 MG tablet Commonly known as: CRESTOR Take 20 mg by mouth every morning.   Vitamin B-12 500 MCG Subl Place 500 mcg under the tongue every morning.         Follow-up Information     Crist Infante, MD. Schedule an appointment as soon as possible for a visit in 1 week(s).   Specialty: Internal Medicine Contact information: [address] [place] [address] [phone]                 Major procedures and Radiology Reports - PLEASE review detailed and final reports thoroughly  -       DG Chest 2 View  Result Date: 11/12/2021 CLINICAL DATA:  Fall EXAM: CHEST - 2 VIEW COMPARISON:  08/29/2012 FINDINGS: Shallow lung inflation with bibasilar atelectasis. Mild cardiomegaly. Normal pleural spaces. IMPRESSION: Shallow lung inflation with bibasilar atelectasis. Electronically Signed   By: Ulyses Jarred M.D.   On: 11/12/2021 19:44   DG Pelvis 1-2 Views  Result Date: 11/12/2021 CLINICAL DATA:  Fall EXAM: PELVIS - 1-2 VIEW COMPARISON:  None Available. FINDINGS: There is no evidence of pelvic fracture or diastasis. No pelvic bone lesions are seen. Dense stool in the colon. IMPRESSION: Negative. Electronically Signed   By: Ulyses Jarred M.D.   On: 11/12/2021 19:52   CT Head Wo Contrast  Result Date: 11/12/2021 CLINICAL DATA:  [place], hit head EXAM: CT HEAD WITHOUT CONTRAST TECHNIQUE: Contiguous axial images were obtained from the base of the skull through the vertex without intravenous contrast. RADIATION DOSE REDUCTION: This exam was performed according to the departmental dose-optimization program which includes automated exposure control, adjustment of the mA and/or kV according to patient size and/or use of iterative reconstruction technique.  COMPARISON:  10/30/2015 FINDINGS: Brain: Chronic small vessel ischemic changes are seen throughout the periventricular white matter and bilateral basal ganglia, progressive  since prior exam. No evidence of acute infarct or hemorrhage. Lateral ventricles and midline structures are unremarkable. No acute extra-axial fluid collections. No mass effect. Vascular: Diffuse atherosclerosis of the internal carotid arteries. No hyperdense vessel. Skull: Normal. Negative for fracture or focal lesion. Sinuses/Orbits: No acute finding. Other: None. IMPRESSION: 1. No acute intracranial process. 2. Chronic small-vessel ischemic changes throughout the periventricular white matter and bilateral basal ganglia. Electronically Signed   By: Randa Beltran M.D.   On: 11/12/2021 20:01   CT Angio Chest PE W and/or Wo Contrast  Result Date: 11/12/2021 CLINICAL DATA:  Fall, evaluate for PE EXAM: CT ANGIOGRAPHY CHEST WITH CONTRAST TECHNIQUE: Multidetector CT imaging of the chest was performed using the standard protocol during bolus administration of intravenous contrast. Multiplanar CT image reconstructions and MIPs were obtained to evaluate the vascular anatomy. RADIATION DOSE REDUCTION: This exam was performed according to the departmental dose-optimization program which includes automated exposure control, adjustment of the mA and/or kV according to patient size and/or use of iterative reconstruction technique. CONTRAST:  44m OMNIPAQUE IOHEXOL 350 MG/ML SOLN COMPARISON:  Chest radiograph dated 11/12/2021 FINDINGS: Cardiovascular: Satisfactory opacification of the bilateral pulmonary arteries to the segmental level. Evaluation is mildly constrained by respiratory motion. Within that constraint, there is an isolated subsegmental right upper lobe pulmonary embolism (series 7/image 124). Overall clot burden is very small. Study is not tailored for evaluation of the thoracic aorta. Atherosclerotic calcifications of the arch. Heart is  normal in size.  No pericardial effusion. Three vessel coronary atherosclerosis. Mediastinum/Nodes: Small mediastinal lymph nodes, likely reactive. Visualized thyroid is unremarkable. Lungs/Pleura: Multifocal patchy opacities in the lungs bilaterally, lower lobe predominant, suspicious for pneumonia. Superimposed bilateral lower lobe atelectasis is possible. Evaluation lung parenchyma is constrained by respiratory motion. Within that constraint, there are no suspicious pulmonary nodules. No pleural effusion or pneumothorax. Upper Abdomen: Visualized upper abdomen is grossly unremarkable, noting vascular calcifications. Musculoskeletal: Degenerative changes at T12-L1. No rib fracture is seen. Degenerative changes of the bilateral shoulders, right greater than left. Review of the MIP images confirms the above findings. IMPRESSION: Isolated subsegmental right upper lobe pulmonary embolism. Overall clot burden is very small. Multifocal pneumonia, lower lobe predominant. Critical Value/emergent results were called by telephone at the time of interpretation on 11/12/2021 at 11:43 pm to provider JULIE HAVILAND , who verbally acknowledged these results. Aortic Atherosclerosis (ICD10-I70.0). Electronically Signed   By: SJulian HyM.D.   On: 11/12/2021 23:46   CT Cervical Spine Wo Contrast  Result Date: 11/12/2021 CLINICAL DATA:  [place] hit head EXAM: CT CERVICAL SPINE WITHOUT CONTRAST TECHNIQUE: Multidetector CT imaging of the cervical spine was performed without intravenous contrast. Multiplanar CT image reconstructions were also generated. RADIATION DOSE REDUCTION: This exam was performed according to the departmental dose-optimization program which includes automated exposure control, adjustment of the mA and/or kV according to patient size and/or use of iterative reconstruction technique. COMPARISON:  None Available. FINDINGS: Alignment: Alignment is grossly anatomic. Skull base and vertebrae: No acute fracture.  No primary bone lesion or focal pathologic process. Soft tissues and spinal canal: No prevertebral fluid or swelling. No visible canal hematoma. Disc levels: There is severe multilevel spondylosis and facet hypertrophy. There is bony fusion across the C3-4 disc space. Severe disc space narrowing and osteophyte formation are seen at C4-5, C5-6, and C6-7. Diffuse facet hypertrophic changes greatest at C2-3 and C3-4. Upper chest: Airway is patent. Lung apices are clear. Incidental note is made of fluid-filled upper thoracic esophagus which could reflect reflux.  Other: Reconstructed images demonstrate no additional findings. IMPRESSION: 1. No acute cervical spine fracture. 2. Severe multilevel spondylosis and facet hypertrophy as above. Electronically Signed   By: Randa Beltran M.D.   On: 11/12/2021 20:04   DG Chest Port 1 View  Result Date: 11/15/2021 CLINICAL DATA:  88 year old male with history of multifocal pneumonia. EXAM: PORTABLE CHEST 1 VIEW COMPARISON:  Chest x-ray 11/12/2021. FINDINGS: Lung volumes are low. Patchy ill-defined opacities are noted throughout the perihilar aspects of the lungs and medial aspects of the lower lobes of the lungs bilaterally (right greater than left). No definite pleural effusions. No pneumothorax. No evidence of pulmonary edema. Heart size appears normal. Mediastinal contours are distorted by patient positioning. Atherosclerotic calcifications in the thoracic aorta. IMPRESSION: 1. Low lung volumes with multilobar bilateral bronchopneumonia, worsened compared to the prior examination, as above. 2. Aortic atherosclerosis. Electronically Signed   By: Vinnie Langton M.D.   On: 11/15/2021 07:25   DG Knee Complete 4 Views Left  Result Date: 11/12/2021 CLINICAL DATA:  Fall EXAM: LEFT KNEE - COMPLETE 4+ VIEW COMPARISON:  None Available. FINDINGS: No evidence of fracture, dislocation, or joint effusion. No evidence of arthropathy or other focal bone abnormality. Soft tissues are  unremarkable. IMPRESSION: Negative. Electronically Signed   By: Ulyses Jarred M.D.   On: 11/12/2021 19:49   DG Knee Complete 4 Views Right  Result Date: 11/12/2021 CLINICAL DATA:  Fall EXAM: RIGHT KNEE - COMPLETE 4+ VIEW COMPARISON:  None Available. FINDINGS: No evidence of fracture, dislocation, or joint effusion. No evidence of arthropathy or other focal bone abnormality. Soft tissues are unremarkable. IMPRESSION: Negative. Electronically Signed   By: Ulyses Jarred M.D.   On: 11/12/2021 19:50   DG Swallowing Func-Speech Pathology  Result Date: 11/13/2021 Table formatting from the original result was not included. Objective Swallowing Evaluation: Type of Study: MBS-Modified Barium Swallow Study  Patient Details Name: Robert Beltran MRN: [id number] Date of Birth: [birth year] Today's Date: 11/13/2021 Time: SLP Start Time (ACUTE ONLY): 1450 -SLP Stop Time (ACUTE ONLY): 1515 SLP Time Calculation (min) (ACUTE ONLY): 25 min Past Medical History: Past Medical History: Diagnosis Date  Arthritis   Benign prostate hyperplasia   Cancer ([place])   testicular cancer 1964  Chronic back pain   Depression   Diabetes mellitus without complication (HCC)   DJD (degenerative joint disease)   GERD (gastroesophageal reflux disease)   HLD (hyperlipidemia)   Hypertension   sees Dr. Crist Infante,  Past Surgical History: Past Surgical History: Procedure Laterality Date  APPENDECTOMY    1995  EYE SURGERY    bilateral surgery  LUMBAR LAMINECTOMY/DECOMPRESSION MICRODISCECTOMY N/A 08/29/2012  Procedure: LUMBAR LAMINECTOMY/DECOMPRESSION MICRODISCECTOMY 2 LEVELS;  Surgeon: Eustace Moore, MD;  Location: [facility];  Service: Neurosurgery;  Laterality: N/A;  SHOULDER SURGERY    right HPI: Pt is a 88 y.o. male who presented to the ED after fall and was admitted 11/12/2021 with severe sepsis due to multifocal pneumonia. Pt reportedly vomited during CT scan while supine. CT chest 5/18: Multifocal patchy opacities in the lungs bilaterally, lower lobe  predominant, suspicious for pneumonia. Superimposed bilateral lower lobe atelectasis is possible. PMH: type 2 diabetes mellitus, essential hypertension, GERD, hyperlipidemia. MBS 06/04/21 revealed esophageal stasis, but a functional oropharyngeal swallow. A regular texture diet with thin liquids (via cup) was recommended as well as an esophagram due to noted stasis. No subsequent esophagram noted in pt's chart.  No data recorded  Recommendations for follow up therapy are one component of  a multi-disciplinary discharge planning process, led by the attending physician.  Recommendations may be updated based on patient status, additional functional criteria and insurance authorization. Assessment / Plan / Recommendation   11/13/2021   4:12 PM Clinical Impressions Clinical Impression Cervical osteophytes noted C4-6 with moderate anterior protrusion into the pharynx. Pt's overall pharyngeal swallow appears worse compared to the study on 06/04/21. Pt presents with pharyngeal dsyphagia characterized by a phrayngeal delay, as well as reduction in tongue base retraction, pharyngeal constriction, and anterior laryngeal movement.  He demonstrated vallecular residue, posterior pharyngeal wall residue, and pyriform sinus residue. With large boluses of thin liquids, pt appeared to have difficulty maintaining laryngeal closure, and generating enough pressure to facilitate transit of these boluses past the point of the cervical osteophytes. Penetration (PAS 3, 5) and aspiration (PAS 7, 8) were therefore noted with large boluses of thin liquids via straw and with consecutive swallows of thin liquids via cup. Throat clearing and a slight delayed cough were inconsistently noted following apsiration. Prompted coughing was ineffective in expelling aspirate, but did mobilize material within the larynx. No functional benefit was noted with a chin tuck posture which increased laryngeal invasion and episodes of aspiration. Pt independently  reduced bolus sizes with nectar thick liquids via cup and no laryngeal invasion was noted therewith. Penetration and aspiration were eliminated with individual, smaller boluses of thin liquids via cup. Secondary swallows were effective in reducing pharyngeal residue to a functional level. Pt's current diet of regular texture solids and thin liquids will be continued with strict observance of swallowing precautions. SLP will follow to ensure tolerance for dysphagia treatment. SLP Visit Diagnosis Dysphagia, pharyngeal phase (R13.13) Impact on safety and function Mild aspiration risk     11/13/2021   4:12 PM Treatment Recommendations Treatment Recommendations Therapy as outlined in treatment plan below     11/13/2021   4:12 PM Prognosis Prognosis for Safe Diet Advancement Good Barriers to Reach Goals Cognitive deficits   11/13/2021   4:12 PM Diet Recommendations SLP Diet Recommendations Regular solids;Thin liquid Liquid Administration via Cup;No straw Medication Administration Whole meds with puree Compensations Slow rate;Small sips/bites;Multiple dry swallows after each bite/sip Postural Changes Seated upright at 90 degrees     11/13/2021   4:12 PM Other Recommendations Oral Care Recommendations Oral care BID Follow Up Recommendations Skilled nursing-short term rehab (<3 hours/day) Functional Status Assessment Patient has had a recent decline in their functional status and demonstrates the ability to make significant improvements in function in a reasonable and predictable amount of time.   11/13/2021   4:12 PM Frequency and Duration  Speech Therapy Frequency (ACUTE ONLY) min 2x/week Treatment Duration 2 weeks     11/13/2021   4:12 PM Oral Phase Oral Phase [facility]    11/13/2021   4:12 PM Pharyngeal Phase Pharyngeal Phase Impaired Pharyngeal- Nectar Cup Delayed swallow initiation-vallecula;Delayed swallow initiation-pyriform sinuses;Reduced pharyngeal peristalsis;Pharyngeal residue - pyriform;Pharyngeal residue - posterior  pharnyx;Pharyngeal residue - valleculae;Reduced anterior laryngeal mobility;Reduced tongue base retraction Pharyngeal- Thin Cup Delayed swallow initiation-vallecula;Delayed swallow initiation-pyriform sinuses;Reduced pharyngeal peristalsis;Pharyngeal residue - pyriform;Pharyngeal residue - posterior pharnyx;Pharyngeal residue - valleculae;Reduced anterior laryngeal mobility;Reduced tongue base retraction;Penetration/Aspiration during swallow Pharyngeal Material enters airway, remains ABOVE vocal cords and not ejected out;Material enters airway, CONTACTS cords and not ejected out;Material enters airway, passes BELOW cords and not ejected out despite cough attempt by patient;Material enters airway, passes BELOW cords without attempt by patient to eject out (silent aspiration) Pharyngeal- Thin Straw Delayed swallow initiation-vallecula;Delayed swallow initiation-pyriform sinuses;Reduced pharyngeal peristalsis;Pharyngeal residue -  pyriform;Pharyngeal residue - posterior pharnyx;Pharyngeal residue - valleculae;Reduced anterior laryngeal mobility;Reduced tongue base retraction;Penetration/Aspiration during swallow;Penetration/Apiration after swallow Pharyngeal Material enters airway, passes BELOW cords and not ejected out despite cough attempt by patient;Material enters airway, passes BELOW cords without attempt by patient to eject out (silent aspiration) Pharyngeal- Puree Delayed swallow initiation-vallecula;Delayed swallow initiation-pyriform sinuses;Reduced pharyngeal peristalsis;Pharyngeal residue - pyriform;Pharyngeal residue - posterior pharnyx;Pharyngeal residue - valleculae;Reduced anterior laryngeal mobility;Reduced tongue base retraction Pharyngeal- Regular Delayed swallow initiation-vallecula;Delayed swallow initiation-pyriform sinuses;Reduced pharyngeal peristalsis;Pharyngeal residue - pyriform;Pharyngeal residue - posterior pharnyx;Pharyngeal residue - valleculae;Reduced anterior laryngeal mobility;Reduced  tongue base retraction Pharyngeal- Pill Delayed swallow initiation-vallecula;Delayed swallow initiation-pyriform sinuses;Reduced pharyngeal peristalsis;Pharyngeal residue - pyriform;Pharyngeal residue - posterior pharnyx;Pharyngeal residue - valleculae;Reduced anterior laryngeal mobility;Reduced tongue base retraction;Penetration/Aspiration during swallow;Penetration/Aspiration before swallow Pharyngeal Material enters airway, passes BELOW cords without attempt by patient to eject out (silent aspiration)    11/13/2021   4:12 PM Cervical Esophageal Phase  Cervical Esophageal Phase WFL (mild stasis noted in the distal esophagus) Shanika I. Hardin Negus, [place], [place] Office number [phone] Pager [phone] Horton Marshall 11/13/2021, 4:33 PM                     ECHOCARDIOGRAM COMPLETE  Result Date: 11/13/2021    ECHOCARDIOGRAM REPORT   Patient Name:   Robert Beltran Date of Exam: 11/13/2021 Medical Rec #:  [id number]      Height:       62.0 in Accession #:    [id number]     Weight:       157.6 lb Date of Birth:  25-Mar-1924      BSA:          1.728 m Patient Age:    89 years       BP:           123/45 mmHg Patient Gender: M              HR:           69 bpm. Exam Location:  Inpatient Procedure: 2D Echo, Cardiac Doppler and Color Doppler Indications:    Pulmonary Embolus I26.09  History:        Patient has prior history of Echocardiogram examinations, most                 recent 08/25/2012. Risk Factors:Hypertension, Dyslipidemia, GERD                 and Diabetes.  Sonographer:    Bernadene Person RDCS Referring Phys: [place]  1. Left ventricular ejection fraction, by estimation, is 50 to 55%. The left ventricle has low normal function. The left ventricle has no regional wall motion abnormalities. Left ventricular diastolic parameters were normal.  2. Right ventricular systolic function is normal. The right ventricular size is normal. There is normal pulmonary  artery systolic pressure.  3. The mitral valve is abnormal. Trivial mitral valve regurgitation. No evidence of mitral stenosis.  4. The aortic valve is tricuspid. There is moderate calcification of the aortic valve. There is moderate thickening of the aortic valve. Aortic valve regurgitation is mild. Aortic valve sclerosis/calcification is present, without any evidence of aortic stenosis.  5. Aortic dilatation noted. There is mild dilatation of the ascending aorta, measuring 38 mm.  6. The inferior vena cava is normal in size with greater than 50% respiratory variability, suggesting right atrial pressure of 3 mmHg. FINDINGS  Left Ventricle: Abnormal septal motion inferior basal hypokinesis. Left  ventricular ejection fraction, by estimation, is 50 to 55%. The left ventricle has low normal function. The left ventricle has no regional wall motion abnormalities. The left ventricular internal cavity size was normal in size. There is no left ventricular hypertrophy. Left ventricular diastolic parameters were normal. Right Ventricle: The right ventricular size is normal. No increase in right ventricular wall thickness. Right ventricular systolic function is normal. There is normal pulmonary artery systolic pressure. The tricuspid regurgitant velocity is 2.87 m/s, and  with an assumed right atrial pressure of 3 mmHg, the estimated right ventricular systolic pressure is 31.5 mmHg. Left Atrium: Left atrial size was normal in size. Right Atrium: Right atrial size was normal in size. Pericardium: There is no evidence of pericardial effusion. Mitral Valve: The mitral valve is abnormal. There is mild thickening of the mitral valve leaflet(s). There is mild calcification of the mitral valve leaflet(s). Mild mitral annular calcification. Trivial mitral valve regurgitation. No evidence of mitral valve stenosis. Tricuspid Valve: The tricuspid valve is normal in structure. Tricuspid valve regurgitation is mild . No evidence of  tricuspid stenosis. Aortic Valve: The aortic valve is tricuspid. There is moderate calcification of the aortic valve. There is moderate thickening of the aortic valve. Aortic valve regurgitation is mild. Aortic regurgitation PHT measures 455 msec. Aortic valve sclerosis/calcification is present, without any evidence of aortic stenosis. Pulmonic Valve: The pulmonic valve was normal in structure. Pulmonic valve regurgitation is not visualized. No evidence of pulmonic stenosis. Aorta: Aortic dilatation noted. There is mild dilatation of the ascending aorta, measuring 38 mm. Venous: The inferior vena cava is normal in size with greater than 50% respiratory variability, suggesting right atrial pressure of 3 mmHg. IAS/Shunts: No atrial level shunt detected by color flow Doppler.  LEFT VENTRICLE PLAX 2D LVIDd:         4.20 cm     Diastology LVIDs:         3.40 cm     LV e' medial:    3.85 cm/s LV PW:         1.00 cm     LV E/e' medial:  18.8 LV IVS:        1.00 cm     LV e' lateral:   6.73 cm/s LVOT diam:     2.00 cm     LV E/e' lateral: 10.7 LV SV:         68 LV SV Index:   40 LVOT Area:     3.14 cm  LV Volumes (MOD) LV vol d, MOD A2C: 93.1 ml LV vol d, MOD A4C: 82.0 ml LV vol s, MOD A2C: 46.8 ml LV vol s, MOD A4C: 37.8 ml LV SV MOD A2C:     46.3 ml LV SV MOD A4C:     82.0 ml LV SV MOD BP:      45.6 ml RIGHT VENTRICLE RV S prime:     10.90 cm/s TAPSE (M-mode): 1.7 cm LEFT ATRIUM             Index        RIGHT ATRIUM           Index LA diam:        3.30 cm 1.91 cm/m   RA Area:     13.60 cm LA Vol (A2C):   44.8 ml 25.93 ml/m  RA Volume:   27.40 ml  15.86 ml/m LA Vol (A4C):   41.7 ml 24.14 ml/m LA Biplane Vol: 46.7 ml 27.03 ml/m  AORTIC VALVE LVOT Vmax:   89.40 cm/s LVOT Vmean:  66.800 cm/s LVOT VTI:    0.218 m AI PHT:      455 msec  AORTA Ao Root diam: 3.80 cm Ao Asc diam:  3.80 cm MITRAL VALVE               TRICUSPID VALVE MV Area (PHT): 4.26 cm    TR Peak grad:   32.9 mmHg MV Decel Time: 178 msec    TR Vmax:         287.00 cm/s MR PISA:        0.57 cm MR PISA Radius: 0.30 cm    SHUNTS MV E velocity: 72.30 cm/s  Systemic VTI:  0.22 m MV A velocity: 86.80 cm/s  Systemic Diam: 2.00 cm MV E/A ratio:  0.83 Jenkins Rouge MD Electronically signed by Jenkins Rouge MD Signature Date/Time: 11/13/2021/6:04:42 PM    Final    VAS [place] LOWER EXTREMITY VENOUS (DVT)  Result Date: 11/13/2021  Lower Venous DVT Study Patient Name:  Robert Beltran  Date of Exam:   11/13/2021 Medical Rec #: [id number]       Accession #:    [id number] Date of Birth: 05-19-1924       Patient Gender: M Patient Age:   89 years Exam Location:  [facility] Procedure:      VAS [place] LOWER EXTREMITY VENOUS (DVT) Referring Phys: Oren Binet --------------------------------------------------------------------------------  Indications: Pulmonary embolism, and Swelling.  Comparison Study: No recent prior studies. Performing Technologist: Darlin Coco RDMS, RVT  Examination Guidelines: A complete evaluation includes B-mode imaging, spectral Doppler, color Doppler, and power Doppler as needed of all accessible portions of each vessel. Bilateral testing is considered an integral part of a complete examination. Limited examinations for reoccurring indications may be performed as noted. The reflux portion of the exam is performed with the patient in reverse Trendelenburg.  +---------+---------------+---------+-----------+----------+--------------+ RIGHT    CompressibilityPhasicitySpontaneityPropertiesThrombus Aging +---------+---------------+---------+-----------+----------+--------------+ CFV      Full           Yes      Yes                                 +---------+---------------+---------+-----------+----------+--------------+ SFJ      Full                                                        +---------+---------------+---------+-----------+----------+--------------+ FV Prox  Full                                                         +---------+---------------+---------+-----------+----------+--------------+ FV Mid   Full                                                        +---------+---------------+---------+-----------+----------+--------------+ FV DistalFull                                                        +---------+---------------+---------+-----------+----------+--------------+  PFV      Full                                                        +---------+---------------+---------+-----------+----------+--------------+ POP      Full           Yes      Yes                                 +---------+---------------+---------+-----------+----------+--------------+ PTV      Full                                                        +---------+---------------+---------+-----------+----------+--------------+ PERO     Full                                                        +---------+---------------+---------+-----------+----------+--------------+   +---------+---------------+---------+-----------+----------+--------------+ LEFT     CompressibilityPhasicitySpontaneityPropertiesThrombus Aging +---------+---------------+---------+-----------+----------+--------------+ CFV      Full           Yes      Yes                                 +---------+---------------+---------+-----------+----------+--------------+ SFJ      Full                                                        +---------+---------------+---------+-----------+----------+--------------+ FV Prox  Full                                                        +---------+---------------+---------+-----------+----------+--------------+ FV Mid   Full                                                        +---------+---------------+---------+-----------+----------+--------------+ FV DistalFull                                                         +---------+---------------+---------+-----------+----------+--------------+ PFV      Full                                                        +---------+---------------+---------+-----------+----------+--------------+   POP      Full           Yes      Yes                                 +---------+---------------+---------+-----------+----------+--------------+ PTV      Full                                                        +---------+---------------+---------+-----------+----------+--------------+ PERO     Full                                                        +---------+---------------+---------+-----------+----------+--------------+     Summary: RIGHT: - There is no evidence of deep vein thrombosis in the lower extremity.  - No cystic structure found in the popliteal fossa.  LEFT: - There is no evidence of deep vein thrombosis in the lower extremity.  - No cystic structure found in the popliteal fossa.  *See table(s) above for measurements and observations. Electronically signed by Jamelle Haring on 11/13/2021 at 5:25:31 PM.    Final      Today   Subjective    Robert Beltran today has no headache,no chest abdominal pain,no new weakness tingling or numbness, feels much better wants to go home today.     Objective   Blood pressure (!) 148/56, pulse 75, temperature 97.6 F (36.4 C), temperature source Oral, resp. rate 17, height '5\' 2"'$  (1.575 m), weight 68.1 kg, SpO2 97 %.   Intake/Output Summary (Last 24 hours) at 11/16/2021 0840 Last data filed at 11/16/2021 0313 Gross per 24 hour  Intake --  Output 850 ml  Net -850 ml    Exam  Awake Alert, No new F.N deficits,    [place].AT,PERRAL Supple Neck,   Symmetrical Chest wall movement, Good air movement bilaterally, CTAB RRR,No Gallops,   +ve B.Sounds, Abd Soft, Non tender,  No Cyanosis, Clubbing or edema    Data Review   CBC w Diff:  Lab Results  Component Value Date   WBC 9.7 11/15/2021   HGB 13.0  11/15/2021   HCT 40.5 11/15/2021   PLT 157 11/15/2021   LYMPHOPCT 2 11/13/2021   MONOPCT 3 11/13/2021   EOSPCT 0 11/13/2021   BASOPCT 0 11/13/2021    CMP:  Lab Results  Component Value Date   NA 140 11/15/2021   K 4.1 11/15/2021   CL 104 11/15/2021   CO2 28 11/15/2021   BUN 21 11/15/2021   CREATININE 1.01 11/15/2021   PROT 5.5 (L) 11/15/2021   ALBUMIN 2.6 (L) 11/15/2021   BILITOT 0.5 11/15/2021   ALKPHOS 55 11/15/2021   AST 32 11/15/2021   ALT 17 11/15/2021  .   Total Time in preparing paper work, data evaluation and todays exam - 21 minutes  Lala Lund M.D on 11/16/2021 at 8:40 AM  [facility]

## 2021-11-16 NOTE — Progress Notes (Signed)
Report called to SNF, SBAR followed, questions asked and answered. IV to be removed after antibiotic completed

## 2021-11-16 NOTE — Discharge Instructions (Signed)
Follow with Primary MD Crist Infante, MD in 7 days   Get CBC, CMP, 2 view Chest X ray -  checked next visit within 1 week by SNF MD    Activity: As tolerated with Full fall precautions use walker/cane & assistance as needed  Disposition SNF  Diet: Heart Healthy with feeding assistance and aspiration precautions.    Special Instructions: If you have smoked or chewed Tobacco  in the last 2 yrs please stop smoking, stop any regular Alcohol  and or any Recreational drug use.  On your next visit with your primary care physician please Get Medicines reviewed and adjusted.  Please request your Prim.MD to go over all Hospital Tests and Procedure/Radiological results at the follow up, please get all Hospital records sent to your Prim MD by signing hospital release before you go home.  If you experience worsening of your admission symptoms, develop shortness of breath, life threatening emergency, suicidal or homicidal thoughts you must seek medical attention immediately by calling 911 or calling your MD immediately  if symptoms less severe.  You Must read complete instructions/literature along with all the possible adverse reactions/side effects for all the Medicines you take and that have been prescribed to you. Take any new Medicines after you have completely understood and accpet all the possible adverse reactions/side effects.

## 2021-11-17 ENCOUNTER — Non-Acute Institutional Stay: Payer: Medicare Other | Admitting: Internal Medicine

## 2021-11-17 VITALS — BP 129/64 | HR 77 | Temp 97.4°F | Resp 22 | Wt 157.6 lb

## 2021-11-17 DIAGNOSIS — R1319 Other dysphagia: Secondary | ICD-10-CM

## 2021-11-17 DIAGNOSIS — Z515 Encounter for palliative care: Secondary | ICD-10-CM

## 2021-11-17 DIAGNOSIS — G4733 Obstructive sleep apnea (adult) (pediatric): Secondary | ICD-10-CM

## 2021-11-17 DIAGNOSIS — I5032 Chronic diastolic (congestive) heart failure: Secondary | ICD-10-CM

## 2021-11-17 DIAGNOSIS — J9601 Acute respiratory failure with hypoxia: Secondary | ICD-10-CM

## 2021-11-17 NOTE — Progress Notes (Signed)
[facility] Consult Note Telephone: [phone]  Fax: [phone]   Date of encounter: 11/17/21 4:16 PM PATIENT NAME: Robert Beltran [address]  [address] [place] [address]   [phone] (home)  DOB: 31-Mar-1924 MRN: [id number] PRIMARY CARE PROVIDER:    Crist Infante, MD,  [place] [address] [phone]  REFERRING PROVIDER:   Crist Infante, MD [address] [place],  [place] [address] [phone]  RESPONSIBLE PARTY:    Contact Information     Name Relation Home Work Mobile   [place] [phone]          I met face to face with patient and family in [place] facility. Palliative Care was asked to follow this patient by consultation request of  Robert Simmer, NP to address advance care planning and complex medical decision making. This is the initial visit.                                     ASSESSMENT AND PLAN / RECOMMENDATIONS:   Advance Care Planning/Goals of Care: Goals include to maximize quality of life and symptom management. Patient/health care surrogate gave his/her permission to discuss.Our advance care planning conversation included a discussion about:    The value and importance of advance care planning  Experiences with loved ones who have been seriously ill or have died  Exploration of personal, cultural or spiritual beliefs that might influence medical decisions  Exploration of goals of care in the event of a sudden injury or illness  Identification  of a healthcare agent  Review and updating or creation of an  advance directive document . Decision not to resuscitate or to de-escalate disease focused treatments due to poor prognosis. CODE STATUS:  FULL CODE, would still want to be hospitalized, spent 20 minutes reviewing CPR process, outcomes, risks, benefits, options and pt continued to elect full code status at this time; goal is to return back to [facility] in [place]  level of care as was recommended by his PCP.  Son Robert Beltran is supportive of dad's wishes.  Explained that our team could follow pt back there.   Reviewed what brings him joy:  music, helping others, making people laugh He does not want to be kept alive in a PVS.    Symptom Management/Plan: 1. Dysphagia causing pulmonary aspiration with swallowing -educated that this often is recurrent and in view of his physical pathology causing, it's likely to require ongoing modifications--cont ST here for this   2. Acute respiratory failure with hypoxia (HCC) -continues on 2L O2 at this point and sats just 93% at this point, monitor and wean if tolerated -has had PE (now on eliquis), chf and aspiration pneumonia  3. Chronic diastolic CHF (congestive heart failure) (HCC) -ongoing, but no signs of acute exacerbation, continue current regimen and monitor  4. Severe obstructive sleep apnea-hypopnea syndrome -did not tolerate CPAP therapy so remains an issue, now on O2 alone  5. Palliative care by specialist -reviewed MOST and purpose, palliative vs hospice, code status discussion but wants to remain full code nonetheless, reviewed what we do in palliative care     Follow up Palliative Care Visit: Palliative care will continue to follow for complex medical decision making, advance care planning, and clarification of goals. Return 4 weeks or prn.  This visit was coded based on medical decision making (MDM). 35 mins spent  on ACP.  PPS: 40%  HOSPICE ELIGIBILITY/DIAGNOSIS: TBD  Chief Complaint: Initial palliative consult  HISTORY OF PRESENT ILLNESS:  Robert Beltran is a 88 y.o. year old male  with chronic diastolic CHF, dysphagia with aspiration, HCAP, hypothyroidism, DMII with CKD2, hyperlipidemia, depression, OSA, htn, PE, lumbar spinal stenosis w/ neurogenic claudication, septic shock during last admission.  He was admitted here 11/16/21 after a hospital stay where he was treated for aspiration pneumonia  with septic shock and pulmonary embolism.  He remains on 2L O2 via [place] at [facility] rehab.  He is on a regular diet with nectar-thickened liquids that is low carb and cardiac prudent.  He had received a course of unasyn after his initial empiric abx, was to get assistance with eating, use incentive spirometry and his flutter valve and was put on lovenox and eventually converted to eliquis for his PE on 5/21.    When seen, he reported doing well, but has just been getting therapy evals started.  He feels stronger already.  [place] facility, but [place] has been recommended even before this admission.  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    He has OSA and has not had success with CPAP.  He naps a lot.  He is in good spirits, but vision declining.  He has hearing aids.  He denies pain, but notes stiffness of his knees first thing in the morning and did have a prior lumbar laminectomy.    History obtained from review of EMR, discussion with primary team, and interview with family, facility staff/caregiver and/or Robert Beltran.  I reviewed available labs, medications, imaging, studies and related documents from the EMR.  Records reviewed and summarized above.   ROS  General: NAD EYES: has vision changes ENMT: has dysphagia as epiglottis does not close off properly Cardiovascular: denies chest pain, improving DOE Pulmonary: denies cough, denies increased SOB Abdomen: endorses good appetite but usual schedule is breakfast and supper--does not eat a formal lunch, denies constipation, endorses continence of bowel GU: denies dysuria, endorses continence of urine MSK:  has increased weakness,  fall reported pre hospitalization Skin: denies  rashes or wounds Neurological: denies pain, denies insomnia Psych: Endorses positive mood Heme/lymph/immuno: denies bruises, abnormal bleeding  Physical Exam: Current and past weights:  157.6 lbs 11/16/21 Constitutional: NAD General: frail appearing, normal weight EYES: anicteric sclera, lids intact, no discharge ENMT: hearing aids, oral mucous membranes moist, dentition intact CV: S1S2, RRR, no LE edema Pulmonary: diminished breath sounds and some rhonchi, no cough, 2L via [place] Abdomen: intake 66%, normo-active BS + 4 quadrants, soft and non tender, no ascites GU: deferred MSK: sarcopenia, moves all extremities, ambulatory with walker  Skin: warm and dry, no rashes or wounds on visible skin Neuro:   generalized weakness,  mild cognitive impairment Psych: non-anxious affect, A and O x 3 Hem/lymph/immuno: no widespread bruising  CURRENT PROBLEM LIST:  Patient Active Problem List   Diagnosis Date Noted   Severe sepsis ([place]) 11/13/2021   Acute respiratory failure with hypoxia ([place]) 11/13/2021   Fall at home, initial encounter 11/13/2021   HLD (hyperlipidemia)    Hypertension    Chronic diastolic CHF (congestive heart failure) ([place])    Multifocal pneumonia 11/12/2021  Severe obstructive sleep apnea-hypopnea syndrome 10/29/2019   Type 2 diabetes mellitus with stage 3a chronic kidney disease, without long-term current use of insulin (HCC) 09/17/2019   Excessive daytime sleepiness 09/17/2019   Snoring 09/17/2019   Muscle cramp, nocturnal 09/17/2019   Spinal stenosis of lumbar region with neurogenic claudication 09/17/2019   Nocturia more than twice per night 09/17/2019   Left bundle branch block 08/21/2012   PAST MEDICAL HISTORY:  Active Ambulatory Problems    Diagnosis Date Noted   Left bundle branch block 08/21/2012   Type 2 diabetes mellitus with stage 3a chronic kidney disease, without long-term current use of insulin ([place]) 09/17/2019   Excessive daytime sleepiness 09/17/2019    Snoring 09/17/2019   Muscle cramp, nocturnal 09/17/2019   Spinal stenosis of lumbar region with neurogenic claudication 09/17/2019   Nocturia more than twice per night 09/17/2019   Severe obstructive sleep apnea-hypopnea syndrome 10/29/2019   Multifocal pneumonia 11/12/2021   Severe sepsis ([place]) 11/13/2021   Acute respiratory failure with hypoxia ([place]) 11/13/2021   Fall at home, initial encounter 11/13/2021   HLD (hyperlipidemia)    Hypertension    Chronic diastolic CHF (congestive heart failure) ([place])    Resolved Ambulatory Problems    Diagnosis Date Noted   No Resolved Ambulatory Problems   Past Medical History:  Diagnosis Date   Arthritis    Benign prostate hyperplasia    Cancer ([place])    Chronic back pain    Depression    Diabetes mellitus without complication (HCC)    DJD (degenerative joint disease)    GERD (gastroesophageal reflux disease)    SOCIAL HX:  Social History   Tobacco Use   Smoking status: Former   Smokeless tobacco: Never   Tobacco comments:    1981  Substance Use Topics   Alcohol use: Yes    Comment: "occas"   FAMILY HX:  Family History  Problem Relation Age of Onset   Heart attack Mother    Colon cancer Brother       ALLERGIES:  Allergies  Allergen Reactions   Aspirin Other (See Comments)    GI bleed   Nsaids Other (See Comments)    GI distress   Other Nausea Only    Reaction to Coricidin   Bactrim [Sulfamethoxazole-Trimethoprim] Rash     PERTINENT MEDICATIONS:  Outpatient Encounter Medications as of 11/17/2021  Medication Sig   acetaminophen (TYLENOL) 500 MG tablet Take 500 mg by mouth every 6 (six) hours as needed for headache (pain).   apixaban (ELIQUIS) 5 MG TABS tablet Take 2 tablets (10 mg total) by mouth 2 (two) times daily for 5 days.   [START ON 11/22/2021] apixaban (ELIQUIS) 5 MG TABS tablet Take 1 tablet (5 mg total) by mouth 2 (two) times daily.   candesartan (ATACAND) 8 MG tablet Take 8 mg by mouth every morning.    Cyanocobalamin (VITAMIN B-12) 500 MCG SUBL Place 500 mcg under the tongue every morning.   donepezil (ARICEPT) 5 MG tablet Take 5 mg by mouth at bedtime.   ergocalciferol (VITAMIN D2) 50000 UNITS capsule Take 50,000 Units by mouth once a week.   escitalopram (LEXAPRO) 5 MG tablet Take 5 mg by mouth every morning.   fluticasone (FLONASE) 50 MCG/ACT nasal spray Place 1 spray into both nostrils daily as needed for allergies or rhinitis (seasonal allergies).   levothyroxine (SYNTHROID) 75 MCG tablet Take 75 mcg by mouth daily before breakfast.   metFORMIN (GLUCOPHAGE) 500 MG tablet Take 500 mg by mouth  daily with breakfast.   Multiple Vitamin (MULTIVITAMIN WITH MINERALS) TABS Take 1 tablet by mouth every morning.   polyethylene glycol (MIRALAX / GLYCOLAX) 17 g packet Take 17 g by mouth daily as needed (constipation).   rosuvastatin (CRESTOR) 20 MG tablet Take 20 mg by mouth every morning.   Wound Dressings (AVO CREAM) EMUL Apply 1 application. topically daily.   No facility-administered encounter medications on file as of 11/17/2021.   Thank you for the opportunity to participate in the care of Robert Beltran.  The palliative care team will continue to follow. Please call our office at [phone] if we can be of additional assistance.   Cheyan Frees, DO ,   COVID-19 PATIENT SCREENING TOOL Asked and negative response unless otherwise noted:  Have you had symptoms of covid, tested positive or been in contact with someone with symptoms/positive test in the past 5-10 days? no

## 2021-11-18 LAB — CULTURE, BLOOD (ROUTINE X 2)
Culture: NO GROWTH
Culture: NO GROWTH
Special Requests: ADEQUATE
Special Requests: ADEQUATE

## 2021-11-23 ENCOUNTER — Encounter: Payer: Self-pay | Admitting: Internal Medicine

## 2021-12-16 ENCOUNTER — Non-Acute Institutional Stay: Payer: Medicare Other | Admitting: Internal Medicine

## 2021-12-16 DIAGNOSIS — G4733 Obstructive sleep apnea (adult) (pediatric): Secondary | ICD-10-CM

## 2021-12-16 DIAGNOSIS — Z515 Encounter for palliative care: Secondary | ICD-10-CM

## 2021-12-16 DIAGNOSIS — R1319 Other dysphagia: Secondary | ICD-10-CM

## 2021-12-16 DIAGNOSIS — I5032 Chronic diastolic (congestive) heart failure: Secondary | ICD-10-CM

## 2021-12-16 DIAGNOSIS — J9601 Acute respiratory failure with hypoxia: Secondary | ICD-10-CM

## 2021-12-16 NOTE — Progress Notes (Signed)
[place] Follow-Up Visit Telephone: [phone]  Fax: [phone]   Date of encounter: 12/16/21 5:11 PM PATIENT NAME: Robert Beltran [address]  [address] [place] [address]   [phone] (home)  DOB: [birth year] MRN: [id number] PRIMARY CARE PROVIDER:    Crist Infante, MD,  [place] [address] [phone]  REFERRING PROVIDER:   Nilda Simmer, NP  RESPONSIBLE PARTY:    Contact Information     Name Relation Home Work Mobile   [place] Son [phone]          I met face to face with patient and family in [facility] SN[facility] facility. Palliative Care was asked to follow this patient by consultation request of  Crist Infante, MD to address advance care planning and complex medical decision making. This is follow-up visit.                                     ASSESSMENT AND PLAN / RECOMMENDATIONS:   Advance Care Planning/Goals of Care: Goals include to maximize quality of life and symptom management. Patient/health care surrogate gave his/her permission to discuss.Our advance care planning conversation included a discussion about:    The value and importance of advance care planning  Experiences with loved ones who have been seriously ill or have died  Exploration of personal, cultural or spiritual beliefs that might influence medical decisions  Exploration of goals of care in the event of a sudden injury or illness  Identification  of a healthcare agent  Review and updating or creation of an  advance directive document . Decision not to resuscitate or to de-escalate disease focused treatments due to poor prognosis. CODE STATUS:  FULL CODE; see prior note for more details.   Symptom Management/Plan: 1. Dysphagia causing pulmonary aspiration with swallowing -pt reports improvement here and completed course of abx  2. Acute respiratory failure with hypoxia ([place]) -had been weaned from O2, but nursing  identified sats into 80s and resumed oxygen; however, no signs of volume overload or recurrent infection at this time; sats back up to upper 90s with O2 -may need to keep on hand and continue to monitor oxygen  3. Chronic diastolic CHF (congestive heart failure) (HCC) -appears clinically euvolemic and no reported concerns by nursing from this perspective  4. Severe obstructive sleep apnea-hypopnea syndrome -continue oxygen therapy for this at hs for sure   5. Palliative care by specialist -would still like to complete MOST with pt and his son to get wishes onto paper officially in case of sudden acute illness -pt himself was unsure if he would be returning to [place] as was the original plan when he arrived and prior recommendation from his PCP -will need to touch base with son, Jazziel, re: this  -I was unable to locate the nursing supervisor after the visit to check with her as they were amid moving offices  Follow up Palliative Care Visit: Palliative care will continue to follow for complex medical decision making, advance care planning, and clarification of goals. Return 4 weeks or prn.  This visit was coded based on medical decision making (MDM).  PPS: 40%  HOSPICE ELIGIBILITY/DIAGNOSIS: TBD  Chief Complaint: Follow-up palliative visit  HISTORY OF PRESENT ILLNESS:  Robert Beltran is a 88 y.o. year old male  with recent admission with aspiration pneumonia, dysphagia, chronic diastolic heart failure, respiratory failure,  OSA/hypopnea syndrome, DMII, hypothyroidism, CKD2, hyperlipidemia, depression, lumbar spinal stenosis with neurogenic claudication and septic shock during the admission seen for palliative follow-up.    When seen, he was again in bed.  He was wearing O2.  He believes he's doing fine and does not recall what the long-term plans are for him as far as level of care.  He does have some notable short-term memory loss and enjoys talking more about things from the  past.  Nursing points out that he often yells out in the middle of the night.  He admits to some nightmares that he's had recently.   History obtained from review of EMR, discussion with primary team, and interview with family, facility staff/caregiver and/or Mr. Zwiefelhofer.  I reviewed available labs, medications, imaging, studies and related documents from the EMR.  Records reviewed and summarized above.   ROS Review of Systems  Constitutional:  Positive for activity change, appetite change, fatigue and unexpected weight change. Negative for chills and fever.  HENT:  Positive for trouble swallowing. Negative for congestion.   Respiratory:  Positive for cough. Negative for shortness of breath.   Cardiovascular:  Negative for chest pain, palpitations and leg swelling.  Gastrointestinal:  Negative for abdominal pain and constipation.  Genitourinary:  Negative for dysuria.  Musculoskeletal:  Positive for gait problem.  Skin:  Negative for color change.  Neurological:  Positive for weakness. Negative for dizziness.  Hematological:  Bruises/bleeds easily.  Psychiatric/Behavioral:  Positive for confusion and sleep disturbance.     Physical Exam:  Wt Readings from Last 500 Encounters:  11/17/21 157 lb 9.6 oz (71.5 kg)  11/16/21 150 lb 2.1 oz (68.1 kg)  02/21/20 160 lb (72.6 kg)  09/17/19 165 lb (74.8 kg)  08/29/12 170 lb (77.1 kg)  08/21/12 170 lb (77.1 kg)   Physical Exam Constitutional:      Appearance: Normal appearance.  HENT:     Head: Normocephalic and atraumatic.  Cardiovascular:     Rate and Rhythm: Normal rate and regular rhythm.     Pulses: Normal pulses.     Heart sounds: Normal heart sounds.  Pulmonary:     Effort: Pulmonary effort is normal.     Breath sounds: Normal breath sounds. No wheezing, rhonchi or rales.     Comments: Wearing 2L O2 Abdominal:     General: Bowel sounds are normal.     Palpations: Abdomen is soft.  Musculoskeletal:        General: Normal range  of motion.  Skin:    General: Skin is warm and dry.  Neurological:     General: No focal deficit present.     Mental Status: He is alert and oriented to person, place, and time.  Psychiatric:        Mood and Affect: Mood normal.     Comments: Lacks insight into decline     CURRENT PROBLEM LIST:  Patient Active Problem List   Diagnosis Date Noted   Severe sepsis ([place]) 11/13/2021   Acute respiratory failure with hypoxia ([place]) 11/13/2021   Fall at home, initial encounter 11/13/2021   HLD (hyperlipidemia)    Hypertension    Chronic diastolic CHF (congestive heart failure) ([place])    Multifocal pneumonia 11/12/2021   Severe obstructive sleep apnea-hypopnea syndrome 10/29/2019   Type 2 diabetes mellitus with stage 3a chronic kidney disease, without long-term current use of insulin ([place]) 09/17/2019   Excessive daytime sleepiness 09/17/2019   Snoring 09/17/2019   Muscle cramp, nocturnal 09/17/2019  Spinal stenosis of lumbar region with neurogenic claudication 09/17/2019   Nocturia more than twice per night 09/17/2019   Left bundle branch block 08/21/2012    PAST MEDICAL HISTORY:  Active Ambulatory Problems    Diagnosis Date Noted   Left bundle branch block 08/21/2012   Type 2 diabetes mellitus with stage 3a chronic kidney disease, without long-term current use of insulin ([place]) 09/17/2019   Excessive daytime sleepiness 09/17/2019   Snoring 09/17/2019   Muscle cramp, nocturnal 09/17/2019   Spinal stenosis of lumbar region with neurogenic claudication 09/17/2019   Nocturia more than twice per night 09/17/2019   Severe obstructive sleep apnea-hypopnea syndrome 10/29/2019   Multifocal pneumonia 11/12/2021   Severe sepsis ([place]) 11/13/2021   Acute respiratory failure with hypoxia ([place]) 11/13/2021   Fall at home, initial encounter 11/13/2021   HLD (hyperlipidemia)    Hypertension    Chronic diastolic CHF (congestive heart failure) ([place])    Resolved Ambulatory Problems    Diagnosis  Date Noted   No Resolved Ambulatory Problems   Past Medical History:  Diagnosis Date   Arthritis    Benign prostate hyperplasia    Cancer ([place])    Chronic back pain    Depression    Diabetes mellitus without complication (HCC)    DJD (degenerative joint disease)    GERD (gastroesophageal reflux disease)     SOCIAL HX:  Social History   Tobacco Use   Smoking status: Former   Smokeless tobacco: Never   Tobacco comments:    1981  Substance Use Topics   Alcohol use: Yes    Comment: "occas"     ALLERGIES:  Allergies  Allergen Reactions   Aspirin Other (See Comments)    GI bleed   Nsaids Other (See Comments)    GI distress   Other Nausea Only    Reaction to Coricidin   Bactrim [Sulfamethoxazole-Trimethoprim] Rash      PERTINENT MEDICATIONS:  Outpatient Encounter Medications as of 12/16/2021  Medication Sig   acetaminophen (TYLENOL) 500 MG tablet Take 500 mg by mouth every 6 (six) hours as needed for headache (pain).   apixaban (ELIQUIS) 5 MG TABS tablet Take 2 tablets (10 mg total) by mouth 2 (two) times daily for 5 days.   apixaban (ELIQUIS) 5 MG TABS tablet Take 1 tablet (5 mg total) by mouth 2 (two) times daily.   candesartan (ATACAND) 8 MG tablet Take 8 mg by mouth every morning.   Cyanocobalamin (VITAMIN B-12) 500 MCG SUBL Place 500 mcg under the tongue every morning.   donepezil (ARICEPT) 5 MG tablet Take 5 mg by mouth at bedtime.   ergocalciferol (VITAMIN D2) 50000 UNITS capsule Take 50,000 Units by mouth once a week.   escitalopram (LEXAPRO) 5 MG tablet Take 5 mg by mouth every morning.   fluticasone (FLONASE) 50 MCG/ACT nasal spray Place 1 spray into both nostrils daily as needed for allergies or rhinitis (seasonal allergies).   levothyroxine (SYNTHROID) 75 MCG tablet Take 75 mcg by mouth daily before breakfast.   metFORMIN (GLUCOPHAGE) 500 MG tablet Take 500 mg by mouth daily with breakfast.   Multiple Vitamin (MULTIVITAMIN WITH MINERALS) TABS Take 1 tablet  by mouth every morning.   polyethylene glycol (MIRALAX / GLYCOLAX) 17 g packet Take 17 g by mouth daily as needed (constipation).   rosuvastatin (CRESTOR) 20 MG tablet Take 20 mg by mouth every morning.   Wound Dressings (AVO CREAM) EMUL Apply 1 application. topically daily.   No facility-administered encounter medications on  file as of 12/16/2021.    Thank you for the opportunity to participate in the care of Mr. Kanner.  The palliative care team will continue to follow. Please call our office at [phone] if we can be of additional assistance.   Hollace Kinnier, DO  COVID-19 PATIENT SCREENING TOOL Asked and negative response unless otherwise noted:  Have you had symptoms of covid, tested positive or been in contact with someone with symptoms/positive test in the past 5-10 days? no

## 2021-12-29 ENCOUNTER — Encounter: Payer: Self-pay | Admitting: Internal Medicine

## 2022-02-04 ENCOUNTER — Emergency Department (HOSPITAL_COMMUNITY): Payer: Medicare Other

## 2022-02-04 ENCOUNTER — Inpatient Hospital Stay (HOSPITAL_COMMUNITY): Payer: Medicare Other

## 2022-02-04 ENCOUNTER — Other Ambulatory Visit: Payer: Self-pay

## 2022-02-04 ENCOUNTER — Inpatient Hospital Stay (HOSPITAL_COMMUNITY)
Admission: EM | Admit: 2022-02-04 | Discharge: 2022-02-18 | DRG: 377 | Disposition: A | Discharge status: Skilled Nursing Facility | Payer: Medicare Other | Source: Skilled Nursing Facility | Attending: Internal Medicine | Admitting: Internal Medicine

## 2022-02-04 ENCOUNTER — Encounter (HOSPITAL_COMMUNITY): Payer: Self-pay

## 2022-02-04 DIAGNOSIS — Z7401 Bed confinement status: Secondary | ICD-10-CM

## 2022-02-04 DIAGNOSIS — R627 Adult failure to thrive: Secondary | ICD-10-CM | POA: Diagnosis not present

## 2022-02-04 DIAGNOSIS — E875 Hyperkalemia: Secondary | ICD-10-CM | POA: Diagnosis present

## 2022-02-04 DIAGNOSIS — Z87891 Personal history of nicotine dependence: Secondary | ICD-10-CM | POA: Diagnosis not present

## 2022-02-04 DIAGNOSIS — Z8547 Personal history of malignant neoplasm of testis: Secondary | ICD-10-CM

## 2022-02-04 DIAGNOSIS — N179 Acute kidney failure, unspecified: Secondary | ICD-10-CM | POA: Diagnosis present

## 2022-02-04 DIAGNOSIS — G8929 Other chronic pain: Secondary | ICD-10-CM | POA: Diagnosis present

## 2022-02-04 DIAGNOSIS — Z993 Dependence on wheelchair: Secondary | ICD-10-CM

## 2022-02-04 DIAGNOSIS — R410 Disorientation, unspecified: Secondary | ICD-10-CM

## 2022-02-04 DIAGNOSIS — D62 Acute posthemorrhagic anemia: Secondary | ICD-10-CM | POA: Diagnosis present

## 2022-02-04 DIAGNOSIS — E119 Type 2 diabetes mellitus without complications: Secondary | ICD-10-CM

## 2022-02-04 DIAGNOSIS — E870 Hyperosmolality and hypernatremia: Secondary | ICD-10-CM | POA: Diagnosis present

## 2022-02-04 DIAGNOSIS — I959 Hypotension, unspecified: Secondary | ICD-10-CM | POA: Diagnosis present

## 2022-02-04 DIAGNOSIS — E872 Acidosis, unspecified: Secondary | ICD-10-CM | POA: Diagnosis present

## 2022-02-04 DIAGNOSIS — K219 Gastro-esophageal reflux disease without esophagitis: Secondary | ICD-10-CM | POA: Diagnosis present

## 2022-02-04 DIAGNOSIS — Z515 Encounter for palliative care: Secondary | ICD-10-CM

## 2022-02-04 DIAGNOSIS — Z20822 Contact with and (suspected) exposure to covid-19: Secondary | ICD-10-CM | POA: Diagnosis present

## 2022-02-04 DIAGNOSIS — Z8249 Family history of ischemic heart disease and other diseases of the circulatory system: Secondary | ICD-10-CM

## 2022-02-04 DIAGNOSIS — G9341 Metabolic encephalopathy: Secondary | ICD-10-CM | POA: Diagnosis present

## 2022-02-04 DIAGNOSIS — K449 Diaphragmatic hernia without obstruction or gangrene: Secondary | ICD-10-CM | POA: Diagnosis present

## 2022-02-04 DIAGNOSIS — E876 Hypokalemia: Secondary | ICD-10-CM | POA: Diagnosis present

## 2022-02-04 DIAGNOSIS — J690 Pneumonitis due to inhalation of food and vomit: Secondary | ICD-10-CM

## 2022-02-04 DIAGNOSIS — K922 Gastrointestinal hemorrhage, unspecified: Secondary | ICD-10-CM | POA: Diagnosis present

## 2022-02-04 DIAGNOSIS — E11649 Type 2 diabetes mellitus with hypoglycemia without coma: Secondary | ICD-10-CM | POA: Diagnosis not present

## 2022-02-04 DIAGNOSIS — N1330 Unspecified hydronephrosis: Secondary | ICD-10-CM | POA: Diagnosis present

## 2022-02-04 DIAGNOSIS — J9601 Acute respiratory failure with hypoxia: Secondary | ICD-10-CM | POA: Diagnosis not present

## 2022-02-04 DIAGNOSIS — G4730 Sleep apnea, unspecified: Secondary | ICD-10-CM | POA: Diagnosis not present

## 2022-02-04 DIAGNOSIS — Z800 Family history of malignant neoplasm of digestive organs: Secondary | ICD-10-CM

## 2022-02-04 DIAGNOSIS — E785 Hyperlipidemia, unspecified: Secondary | ICD-10-CM | POA: Diagnosis present

## 2022-02-04 DIAGNOSIS — R1310 Dysphagia, unspecified: Secondary | ICD-10-CM | POA: Diagnosis not present

## 2022-02-04 DIAGNOSIS — K269 Duodenal ulcer, unspecified as acute or chronic, without hemorrhage or perforation: Secondary | ICD-10-CM | POA: Diagnosis not present

## 2022-02-04 DIAGNOSIS — F03A3 Unspecified dementia, mild, with mood disturbance: Secondary | ICD-10-CM | POA: Diagnosis present

## 2022-02-04 DIAGNOSIS — Z7189 Other specified counseling: Secondary | ICD-10-CM | POA: Diagnosis not present

## 2022-02-04 DIAGNOSIS — J9621 Acute and chronic respiratory failure with hypoxia: Secondary | ICD-10-CM | POA: Diagnosis not present

## 2022-02-04 DIAGNOSIS — Z66 Do not resuscitate: Secondary | ICD-10-CM | POA: Diagnosis not present

## 2022-02-04 DIAGNOSIS — Z86711 Personal history of pulmonary embolism: Secondary | ICD-10-CM

## 2022-02-04 DIAGNOSIS — I5032 Chronic diastolic (congestive) heart failure: Secondary | ICD-10-CM | POA: Diagnosis present

## 2022-02-04 DIAGNOSIS — D72829 Elevated white blood cell count, unspecified: Secondary | ICD-10-CM

## 2022-02-04 DIAGNOSIS — N400 Enlarged prostate without lower urinary tract symptoms: Secondary | ICD-10-CM | POA: Diagnosis present

## 2022-02-04 DIAGNOSIS — D649 Anemia, unspecified: Principal | ICD-10-CM

## 2022-02-04 DIAGNOSIS — I110 Hypertensive heart disease with heart failure: Secondary | ICD-10-CM | POA: Diagnosis present

## 2022-02-04 DIAGNOSIS — Z7901 Long term (current) use of anticoagulants: Secondary | ICD-10-CM

## 2022-02-04 DIAGNOSIS — K921 Melena: Secondary | ICD-10-CM | POA: Diagnosis present

## 2022-02-04 DIAGNOSIS — Z79890 Hormone replacement therapy (postmenopausal): Secondary | ICD-10-CM

## 2022-02-04 DIAGNOSIS — D696 Thrombocytopenia, unspecified: Secondary | ICD-10-CM | POA: Diagnosis present

## 2022-02-04 DIAGNOSIS — K264 Chronic or unspecified duodenal ulcer with hemorrhage: Principal | ICD-10-CM | POA: Diagnosis present

## 2022-02-04 DIAGNOSIS — Z79899 Other long term (current) drug therapy: Secondary | ICD-10-CM

## 2022-02-04 DIAGNOSIS — I10 Essential (primary) hypertension: Secondary | ICD-10-CM | POA: Diagnosis present

## 2022-02-04 LAB — CBC WITH DIFFERENTIAL/PLATELET
Abs Immature Granulocytes: 0.28 10*3/uL — ABNORMAL HIGH (ref 0.00–0.07)
Basophils Absolute: 0 10*3/uL (ref 0.0–0.1)
Basophils Relative: 0 %
Eosinophils Absolute: 0 10*3/uL (ref 0.0–0.5)
Eosinophils Relative: 0 %
HCT: 19.1 % — ABNORMAL LOW (ref 39.0–52.0)
Hemoglobin: 6 g/dL — CL (ref 13.0–17.0)
Immature Granulocytes: 1 %
Lymphocytes Relative: 2 %
Lymphs Abs: 0.7 10*3/uL (ref 0.7–4.0)
MCH: 31.3 pg (ref 26.0–34.0)
MCHC: 31.4 g/dL (ref 30.0–36.0)
MCV: 99.5 fL (ref 80.0–100.0)
Monocytes Absolute: 2.2 10*3/uL — ABNORMAL HIGH (ref 0.1–1.0)
Monocytes Relative: 7 %
Neutro Abs: 28.1 10*3/uL — ABNORMAL HIGH (ref 1.7–7.7)
Neutrophils Relative %: 90 %
Platelets: 215 10*3/uL (ref 150–400)
RBC: 1.92 MIL/uL — ABNORMAL LOW (ref 4.22–5.81)
RDW: 15.8 % — ABNORMAL HIGH (ref 11.5–15.5)
WBC: 31.3 10*3/uL — ABNORMAL HIGH (ref 4.0–10.5)
nRBC: 0 % (ref 0.0–0.2)

## 2022-02-04 LAB — COMPREHENSIVE METABOLIC PANEL
ALT: 10 U/L (ref 0–44)
AST: 23 U/L (ref 15–41)
Albumin: 3 g/dL — ABNORMAL LOW (ref 3.5–5.0)
Alkaline Phosphatase: 44 U/L (ref 38–126)
Anion gap: 9 (ref 5–15)
BUN: 119 mg/dL — ABNORMAL HIGH (ref 8–23)
CO2: 28 mmol/L (ref 22–32)
Calcium: 9.4 mg/dL (ref 8.9–10.3)
Chloride: 110 mmol/L (ref 98–111)
Creatinine, Ser: 1.98 mg/dL — ABNORMAL HIGH (ref 0.61–1.24)
GFR, Estimated: 31 mL/min — ABNORMAL LOW (ref >60)
Glucose, Bld: 162 mg/dL — ABNORMAL HIGH (ref 70–99)
Potassium: 6 mmol/L — ABNORMAL HIGH (ref 3.5–5.1)
Sodium: 147 mmol/L — ABNORMAL HIGH (ref 135–145)
Total Bilirubin: 0.4 mg/dL (ref 0.3–1.2)
Total Protein: 5.8 g/dL — ABNORMAL LOW (ref 6.5–8.1)

## 2022-02-04 LAB — PROTIME-INR
INR: 2.2 — ABNORMAL HIGH (ref 0.8–1.2)
Prothrombin Time: 24.6 seconds — ABNORMAL HIGH (ref 11.4–15.2)

## 2022-02-04 LAB — RESP PANEL BY RT-PCR (FLU A&B, COVID) ARPGX2
Influenza A by PCR: NEGATIVE
Influenza B by PCR: NEGATIVE
SARS Coronavirus 2 by RT PCR: NEGATIVE

## 2022-02-04 LAB — CBG MONITORING, ED: Glucose-Capillary: 137 mg/dL — ABNORMAL HIGH (ref 70–99)

## 2022-02-04 LAB — LACTIC ACID, PLASMA
Lactic Acid, Venous: 2.3 mmol/L (ref 0.5–1.9)
Lactic Acid, Venous: 2.7 mmol/L (ref 0.5–1.9)

## 2022-02-04 LAB — APTT: aPTT: 41 seconds — ABNORMAL HIGH (ref 24–36)

## 2022-02-04 LAB — POC OCCULT BLOOD, ED: Fecal Occult Bld: POSITIVE — AB

## 2022-02-04 LAB — ABO/RH: ABO/RH(D): A NEG

## 2022-02-04 LAB — PREPARE RBC (CROSSMATCH)

## 2022-02-04 MED ORDER — SODIUM ZIRCONIUM CYCLOSILICATE 10 G PO PACK: 10.0000 g | PACK | Freq: Once | ORAL | Status: DC

## 2022-02-04 MED ORDER — PROTHROMBIN COMPLEX CONC HUMAN 500 UNITS IV KIT
3306.0000 [IU] | PACK | Status: AC
  Administered 2022-02-04: 3306 [IU] via INTRAVENOUS
  Filled 2022-02-04: qty 3306

## 2022-02-04 MED ORDER — DOCUSATE SODIUM 100 MG PO CAPS: 100.0000 mg | ORAL_CAPSULE | Freq: Two times a day (BID) | ORAL | Status: DC | PRN

## 2022-02-04 MED ORDER — SODIUM CHLORIDE 0.9 % IV SOLN: 10.0000 mL/h | Freq: Once | INTRAVENOUS | Status: DC

## 2022-02-04 MED ORDER — SODIUM CHLORIDE 0.9 % IV BOLUS
2500.0000 mL | Freq: Once | INTRAVENOUS | Status: AC
  Administered 2022-02-04: 2500 mL via INTRAVENOUS

## 2022-02-04 MED ORDER — INSULIN ASPART 100 UNIT/ML IJ SOLN
0.0000 [IU] | INTRAMUSCULAR | Status: DC
  Administered 2022-02-05 – 2022-02-07 (×3): 1 [IU] via SUBCUTANEOUS
  Administered 2022-02-08: 2 [IU] via SUBCUTANEOUS
  Administered 2022-02-08 – 2022-02-10 (×6): 1 [IU] via SUBCUTANEOUS
  Administered 2022-02-11: 2 [IU] via SUBCUTANEOUS
  Administered 2022-02-12 – 2022-02-13 (×3): 1 [IU] via SUBCUTANEOUS
  Administered 2022-02-14 – 2022-02-15 (×2): 2 [IU] via SUBCUTANEOUS

## 2022-02-04 MED ORDER — CHLORHEXIDINE GLUCONATE CLOTH 2 % EX PADS
6.0000 | MEDICATED_PAD | Freq: Every day | CUTANEOUS | Status: DC
  Administered 2022-02-05 – 2022-02-18 (×9): 6 via TOPICAL

## 2022-02-04 MED ORDER — PANTOPRAZOLE SODIUM 40 MG IV SOLR
40.0000 mg | Freq: Once | INTRAVENOUS | Status: AC
  Administered 2022-02-04: 40 mg via INTRAVENOUS
  Filled 2022-02-04: qty 10

## 2022-02-04 MED ORDER — POLYETHYLENE GLYCOL 3350 17 G PO PACK: 17.0000 g | PACK | Freq: Every day | ORAL | Status: DC | PRN

## 2022-02-04 MED ORDER — ORAL CARE MOUTH RINSE
15.0000 mL | OROMUCOSAL | Status: DC
  Administered 2022-02-05 (×3): 15 mL via OROMUCOSAL

## 2022-02-04 MED ORDER — SODIUM CHLORIDE 0.9 % IV SOLN
10.0000 mL/h | Freq: Once | INTRAVENOUS | Status: AC
Start: 2022-02-04 — End: 2022-02-05
  Administered 2022-02-05: 10 mL/h via INTRAVENOUS

## 2022-02-04 MED ORDER — ORAL CARE MOUTH RINSE: 15.0000 mL | OROMUCOSAL | Status: DC | PRN

## 2022-02-04 MED ORDER — PANTOPRAZOLE SODIUM 40 MG IV SOLR
40.0000 mg | Freq: Two times a day (BID) | INTRAVENOUS | Status: DC
  Administered 2022-02-05 – 2022-02-07 (×6): 40 mg via INTRAVENOUS
  Filled 2022-02-04 (×6): qty 10

## 2022-02-04 NOTE — ED Notes (Addendum)
NT unable to perform in and out cath, prostate enlarged.

## 2022-02-04 NOTE — ED Provider Notes (Signed)
[facility] DEPT Provider Note   CSN: [id number] Arrival date & time: 02/04/22  1741     History  Chief Complaint  Patient presents with   Weakness   Fatigue    Robert Beltran is a 88 y.o. male.  HPI Patient presents from his nursing care facility for evaluation of "decreased activity, decreased oral intake, and sleepiness."  He is unable to give any history.    Home Medications Prior to Admission medications   Medication Sig Start Date End Date Taking? Authorizing Provider  acetaminophen (TYLENOL) 500 MG tablet Take 500 mg by mouth every 6 (six) hours as needed for headache or moderate pain.   Yes [provider]  apixaban (ELIQUIS) 5 MG TABS tablet Take 1 tablet (5 mg total) by mouth 2 (two) times daily. 11/22/21  Yes Thurnell Lose, MD  candesartan (ATACAND) 8 MG tablet Take 8 mg by mouth every morning.   Yes [provider]  donepezil (ARICEPT) 5 MG tablet Take 5 mg by mouth at bedtime. 09/28/21  Yes [provider]  escitalopram (LEXAPRO) 5 MG tablet Take 2.5 mg by mouth every morning.   Yes [provider]  fluticasone (FLONASE) 50 MCG/ACT nasal spray Place 1 spray into both nostrils daily as needed for rhinitis.   Yes [provider]  levothyroxine (SYNTHROID) 75 MCG tablet Take 75 mcg by mouth daily before breakfast.   Yes [provider]  metFORMIN (GLUCOPHAGE) 500 MG tablet Take 500 mg by mouth daily with breakfast.   Yes [provider]  mirtazapine (REMERON) 7.5 MG tablet Take 7.5 mg by mouth at bedtime.   Yes [provider]  Multiple Vitamin (MULTIVITAMIN WITH MINERALS) TABS Take 1 tablet by mouth daily with breakfast.   Yes [provider]  OVER THE COUNTER MEDICATION Take 120 mLs by mouth See admin instructions. MedPass 2.0- Drink 120 ml's by mouth in the morning and at bedtime   Yes [provider]  pantoprazole (PROTONIX) 40 MG tablet Take 40 mg  by mouth in the morning.   Yes [provider]  polyethylene glycol (MIRALAX / GLYCOLAX) 17 g packet Take 17 g by mouth See admin instructions. Mix 17 grams of powder into 6-8 ounces of water or juice and drink every morning   Yes [provider]  rosuvastatin (CRESTOR) 10 MG tablet Take 10 mg by mouth daily.   Yes [provider]  apixaban (ELIQUIS) 5 MG TABS tablet Take 2 tablets (10 mg total) by mouth 2 (two) times daily for 5 days. Patient not taking: Reported on 02/04/2022 11/16/21 02/04/22  Thurnell Lose, MD      Allergies    Aspirin, Nsaids, Other, and Bactrim [sulfamethoxazole-trimethoprim]    Review of Systems   Review of Systems  Physical Exam Updated Vital Signs BP (!) 107/39   Pulse 100   Temp 99 F (37.2 C) (Rectal)   Resp (!) 23   Wt 71.5 kg   SpO2 99%   BMI 28.83 kg/m  Physical Exam Vitals and nursing note reviewed.  Constitutional:      General: He is not in acute distress.    Appearance: He is well-developed. He is not ill-appearing, toxic-appearing or diaphoretic.     Comments: Elderly, frail  HENT:     Head: Normocephalic and atraumatic.     Right Ear: External ear normal.     Left Ear: External ear normal.  Eyes:     Conjunctiva/sclera: Conjunctivae normal.  Pupils: Pupils are equal, round, and reactive to light.  Neck:     Trachea: Phonation normal.  Cardiovascular:     Rate and Rhythm: Normal rate and regular rhythm.     Heart sounds: Normal heart sounds.     Comments: Hypotensive Pulmonary:     Effort: Pulmonary effort is normal.     Breath sounds: Normal breath sounds.  Abdominal:     Palpations: Abdomen is soft.     Tenderness: There is no abdominal tenderness.  Genitourinary:    Comments: Normal anus.  Copious amount of thin dark stool on the [place] anal area.  Occult blood sample obtained by me Musculoskeletal:        General: Normal range of motion.     Cervical back: Normal range of motion and neck supple.   Skin:    General: Skin is warm and dry.     Coloration: Skin is not jaundiced or pale.  Neurological:     Mental Status: He is alert.     Cranial Nerves: No cranial nerve deficit.     Sensory: No sensory deficit.     Motor: No abnormal muscle tone.     Coordination: Coordination normal.  Psychiatric:        Mood and Affect: Mood normal.        Behavior: Behavior normal.     ED Results / Procedures / Treatments   Labs (all labs ordered are listed, but only abnormal results are displayed) Labs Reviewed  COMPREHENSIVE METABOLIC PANEL - Abnormal; Notable for the following components:      Result Value   Sodium 147 (*)    Potassium 6.0 (*)    Glucose, Bld 162 (*)    BUN 119 (*)    Creatinine, Ser 1.98 (*)    Total Protein 5.8 (*)    Albumin 3.0 (*)    GFR, Estimated 31 (*)    All other components within normal limits  LACTIC ACID, PLASMA - Abnormal; Notable for the following components:   Lactic Acid, Venous 2.3 (*)    All other components within normal limits  LACTIC ACID, PLASMA - Abnormal; Notable for the following components:   Lactic Acid, Venous 2.7 (*)    All other components within normal limits  CBC WITH DIFFERENTIAL/PLATELET - Abnormal; Notable for the following components:   WBC 31.3 (*)    RBC 1.92 (*)    Hemoglobin 6.0 (*)    HCT 19.1 (*)    RDW 15.8 (*)    Neutro Abs 28.1 (*)    Monocytes Absolute 2.2 (*)    Abs Immature Granulocytes 0.28 (*)    All other components within normal limits  PROTIME-INR - Abnormal; Notable for the following components:   Prothrombin Time 24.6 (*)    INR 2.2 (*)    All other components within normal limits  APTT - Abnormal; Notable for the following components:   aPTT 41 (*)    All other components within normal limits  CBG MONITORING, ED - Abnormal; Notable for the following components:   Glucose-Capillary 137 (*)    All other components within normal limits  POC OCCULT BLOOD, ED - Abnormal; Notable for the following  components:   Fecal Occult Bld POSITIVE (*)    All other components within normal limits  RESP PANEL BY RT-PCR (FLU A&B, COVID) ARPGX2  CULTURE, BLOOD (ROUTINE X 2)  CULTURE, BLOOD (ROUTINE X 2)  URINALYSIS, ROUTINE W REFLEX MICROSCOPIC  OCCULT BLOOD X 1 CARD  TO LAB, STOOL  MAGNESIUM  PHOSPHORUS  BASIC METABOLIC PANEL  MAGNESIUM  PHOSPHORUS  LIPASE, BLOOD  PREPARE RBC (CROSSMATCH)  TYPE AND SCREEN  PREPARE FRESH FROZEN PLASMA  ABO/RH    EKG EKG Interpretation  Date/Time:  Thursday February 04 2022 17:55:19 EDT Ventricular Rate:  98 PR Interval:  203 QRS Duration: 122 QT Interval:  359 QTC Calculation: 459 R Axis:   -36 Text Interpretation: Sinus rhythm Left bundle branch block since last tracing no significant change Confirmed by Daleen Bo [phone]) on 02/04/2022 7:23:44 PM  Radiology DG Chest Port 1 View  Result Date: 02/04/2022 CLINICAL DATA:  Increased lethargy EXAM: PORTABLE CHEST 1 VIEW COMPARISON:  Previous studies including the examination of 11/15/2021 FINDINGS: There is poor inspiration. Cardiac size is within normal limits. Thoracic aorta is tortuous and ectatic. There is improvement in the aeration of both lungs. There are residual linear densities in both lower lung fields. Right hemidiaphragm is elevated. There is no significant pleural effusion or pneumothorax. Severe degenerative changes are noted in both shoulders. IMPRESSION: There is interval improvement in aeration of both lungs. Residual linear densities in both lower lung fields suggest scarring or subsegmental atelectasis. No new focal infiltrates are seen. There is no pleural effusion. Severe degenerative changes are noted in both shoulders. Electronically Signed   By: Elmer Picker M.D.   On: 02/04/2022 19:00    Procedures Procedures    Medications Ordered in ED Medications  0.9 %  sodium chloride infusion (has no administration in time range)  sodium zirconium cyclosilicate (LOKELMA) packet  10 g (10 g Oral Not Given 02/04/22 2128)  0.9 %  sodium chloride infusion (has no administration in time range)  docusate sodium (COLACE) capsule 100 mg (has no administration in time range)  polyethylene glycol (MIRALAX / GLYCOLAX) packet 17 g (has no administration in time range)  pantoprazole (PROTONIX) injection 40 mg (has no administration in time range)  sodium chloride 0.9 % bolus 2,500 mL (0 mLs Intravenous Stopped 02/04/22 2100)  pantoprazole (PROTONIX) injection 40 mg (40 mg Intravenous Given 02/04/22 2046)  prothrombin complex conc human (KCENTRA) IVPB 3,306 Units (3,306 Units Intravenous New Bag/Given 02/04/22 2122)    ED Course/ Medical Decision Making/ A&P Clinical Course as of 02/04/22 2201  Thu Feb 04, 2022  1835 DG Chest [place] 1 View [EW]  2011 Repeat temperature improved to 99. [EW]  2011 Patient with likely GI bleed.  Blood transfusion 2 units ordered.  Protonix ordered. [EW]  2134 Patient's son Robert Beltran is now here, and states that the patient is "a little groggy now."  Devyon saw him earlier today at his facility. [EW]    Clinical Course User Index [EW] Daleen Bo, MD                           Medical Decision Making Patient presenting for 3-day illness, SIRS positive, with hypotension.  Will initiate resuscitation and order sepsis protocol.  Chest x-ray initially appears normal.  Patient has apparent rectal bleeding, over the last several days is currently anticoagulated with Eliquis for PE.  He has been on oral anticoagulants for 3 months.  He has not had any falls.  Amount and/or Complexity of Data Reviewed Independent Historian:     Details: Patient son by telephone, then in person.  He states that the patient has been having rectal bleeding for 3 days, and that his hemoglobin has dropped from 10-6.  He states that the  patient wants to have aggressive efforts to treat him unless his situation is futile. Labs: ordered.    Details: CBC, metabolic panel, lactate, blood  cultures, stool for occult blood, blood type Radiology: ordered and independent interpretation performed. Decision-making details documented in ED Course.    Details: Chest x-ray -- no infiltrate or edema Discussion of management or test interpretation with external provider(s): Consultation GI for assistance with management.  Patient will likely need upper endoscopy.  I suspect that he has an upper GI bleed.  Intensivist consulted for consideration of admission to their service.  Case discussed with gastroenterologist, Dr. Carol Ada.  He will see the patient as a Optometrist to help with management.  Risk Prescription drug management. Decision regarding hospitalization. Risk Details: Critically ill patient presenting with hypotension and tachycardia, found to have acute GI bleed with significant anemia.  Doubt severe sepsis.  Lactate elevated, and went somewhat higher after IV fluid resuscitation.  High white count likely secondary to demargination associated with acute blood loss.  Patient given Kcentra to reverse Eliquis anticoagulation.  Blood transfusion 2 units ordered.  FFP ordered.  Patient is currently full code.  GI consulted for help with management.  Critical Care Total time providing critical care: 75 minutes            Final Clinical Impression(s) / ED Diagnoses Final diagnoses:  Anemia, unspecified type  Gastrointestinal hemorrhage, unspecified gastrointestinal hemorrhage type  On continuous oral anticoagulation  Hyperkalemia  Leukocytosis, unspecified type    Rx / DC Orders ED Discharge Orders     None         Daleen Bo, MD 02/04/22 2201

## 2022-02-04 NOTE — H&P (Signed)
NAME:  Robert Beltran, MRN:  [id number], DOB:  [birth year], LOS: 0 ADMISSION DATE:  02/04/2022, CONSULTATION DATE:  02/04/22 REFERRING MD:  Christ Kick CHIEF COMPLAINT:  GI Bleed   History of Present Illness:  Robert Beltran is a 88 year old male with BPH, DMII, GERD, hypertension and recent admission 10/2021 for pneumonia and pulmonary emboli where he was started on eliquis. He presented today from his SNF where he was noted to have 3 days of decreased activity, oral intake and dark bowel movements. His hemoglobin was to be dropping over the past couple of days.   In the ER, labs showed hemoglobin of 6g/dL, wbc count 31.3, BUN 119, Cr 1.98 (baseline .9 - 1.01), K 6, Na 147 and lactic acid of 2.3 > 2.7. He was noted to have dark melanotic stool.   PCCM consulted for admission for concern of upper GI bleed.  Pertinent  Medical History   Past Medical History:  Diagnosis Date   Arthritis    Benign prostate hyperplasia    Cancer ([place])    testicular cancer 1964   Chronic back pain    Depression    Diabetes mellitus without complication (HCC)    DJD (degenerative joint disease)    GERD (gastroesophageal reflux disease)    HLD (hyperlipidemia)    Hypertension    sees Dr. Crist Infante,    Significant Hospital Events: Including procedures, antibiotic start and stop dates in addition to other pertinent events   8/10 admitted to ICU for upper GI bleed  Interim History / Subjective:   Patient without acute complaints Updated patient's son, Robert Beltran, via phone  Objective   Blood pressure (!) 117/49, pulse 77, temperature 99 F (37.2 C), resp. rate (!) 25, weight 71.5 kg, SpO2 99 %.       No intake or output data in the 24 hours ending 02/04/22 2324 Filed Weights   02/04/22 2035  Weight: 71.5 kg    Examination: General: elderly male, no acute distress, sleeping in bed HENT: [place]/AT, dry mucous membranes, sclera anicteric Lungs: clear to auscultation bilaterally, no  wheezing Cardiovascular: rrr, no murmurs Abdomen: soft, non-tender, non-distended, decreased bowel sounds, dark melanotic stool in diaper Extremities: warm, no edema Neuro: somnolent, awakes to verbal stimuli GU: n/a  Resolved Hospital Problem list     Assessment & Plan:   Acute GI Bleed Acute Blood Loss Anemia - Kaycentra given in ER - continue to hold eliquis - Pantoprazole '40mg'$  BID - He has 20g IV in left arm, will need a second large bore IV placed, if unable to place will need PICC line - GI consulted by ER team - 2 units PRBCs and 1 unit FFP ordered - Trend CBC q 6 hrs - transfuse for hemoglobin less than 7g/dL  Acute Kidney Injury Hypernatremia, mild Hyperkalemia - fluid resuscitation and blood transfusions - monitor UOP and Cr -given lokelma in ED - monitor potassium closely - renal US  Lactic Acidosis - resuscitation as above - has received 2.5L of fluids - continue to trend  Hx of Pulmonary Emboli - he had subsegmental PE 10/2021  - he does not require further anticoagulation for this  DMII - ssi  Chronic Diastolic Heart Failure Hx of HTN - monitor volume status - hold home antihypertensives at this time  Best Practice (right click and "Reselect all SmartList Selections" daily)   Diet/type: NPO DVT prophylaxis: not indicated GI prophylaxis: PPI Lines: N/A Foley:  N/A Code Status:  full code Last date  of multidisciplinary goals of care discussion [8/10 spoke with son on phone]  Labs   CBC: Recent Labs  Lab 02/04/22 1815  WBC 31.3*  NEUTROABS 28.1*  HGB 6.0*  HCT 19.1*  MCV 99.5  PLT 762    Basic Metabolic Panel: Recent Labs  Lab 02/04/22 1815  NA 147*  K 6.0*  CL 110  CO2 28  GLUCOSE 162*  BUN 119*  CREATININE 1.98*  CALCIUM 9.4   GFR: Estimated Creatinine Clearance: 19.4 mL/min (A) (by C-G formula based on SCr of 1.98 mg/dL (H)). Recent Labs  Lab 02/04/22 1815 02/04/22 2035  WBC 31.3*  --   LATICACIDVEN 2.3* 2.7*     Liver Function Tests: Recent Labs  Lab 02/04/22 1815  AST 23  ALT 10  ALKPHOS 44  BILITOT 0.4  PROT 5.8*  ALBUMIN 3.0*   No results for input(s): "LIPASE", "AMYLASE" in the last 168 hours. No results for input(s): "AMMONIA" in the last 168 hours.  ABG No results found for: "PHART", "PCO2ART", "PO2ART", "HCO3", "TCO2", "ACIDBASEDEF", "O2SAT"   Coagulation Profile: Recent Labs  Lab 02/04/22 1815  INR 2.2*    Cardiac Enzymes: No results for input(s): "CKTOTAL", "CKMB", "CKMBINDEX", "TROPONINI" in the last 168 hours.  HbA1C: Hgb A1c MFr Bld  Date/Time Value Ref Range Status  11/13/2021 04:11 AM 6.3 (H) 4.8 - 5.6 % Final    Comment:    (NOTE) Pre diabetes:          5.7%-6.4%  Diabetes:              >6.4%  Glycemic control for   <7.0% adults with diabetes     CBG: Recent Labs  Lab 02/04/22 1753  GLUCAP 137*    Review of Systems:   A 10 point review of systems completed and is otherwise negative  Past Medical History:  He,  has a past medical history of Arthritis, Benign prostate hyperplasia, Cancer ([place]), Chronic back pain, Depression, Diabetes mellitus without complication ([facility]), DJD (degenerative joint disease), GERD (gastroesophageal reflux disease), HLD (hyperlipidemia), and Hypertension.   Surgical History:   Past Surgical History:  Procedure Laterality Date   APPENDECTOMY     1995   EYE SURGERY     bilateral surgery   LUMBAR LAMINECTOMY/DECOMPRESSION MICRODISCECTOMY N/A 08/29/2012   Procedure: LUMBAR LAMINECTOMY/DECOMPRESSION MICRODISCECTOMY 2 LEVELS;  Surgeon: Eustace Moore, MD;  Location: [facility];  Service: Neurosurgery;  Laterality: N/A;   SHOULDER SURGERY     right     Social History:   reports that he has quit smoking. He has never used smokeless tobacco. He reports current alcohol use. He reports that he does not use drugs.   Family History:  His family history includes Colon cancer in his brother; Heart attack in his mother.    Allergies Allergies  Allergen Reactions   Aspirin Other (See Comments)    GI bleed, abdominal pain and "allergic," per MAR   Nsaids Other (See Comments)    GI distress and "allergic," per MAR   Other Nausea Only and Other (See Comments)    "Reaction to Coricidin"   Bactrim [Sulfamethoxazole-Trimethoprim] Rash     Home Medications  Prior to Admission medications   Medication Sig Start Date End Date Taking? Authorizing Provider  acetaminophen (TYLENOL) 500 MG tablet Take 500 mg by mouth every 6 (six) hours as needed for headache or moderate pain.   Yes [provider]  apixaban (ELIQUIS) 5 MG TABS tablet Take 1 tablet (5 mg  total) by mouth 2 (two) times daily. 11/22/21  Yes Thurnell Lose, MD  candesartan (ATACAND) 8 MG tablet Take 8 mg by mouth every morning.   Yes [provider]  donepezil (ARICEPT) 5 MG tablet Take 5 mg by mouth at bedtime. 09/28/21  Yes [provider]  escitalopram (LEXAPRO) 5 MG tablet Take 2.5 mg by mouth every morning.   Yes [provider]  fluticasone (FLONASE) 50 MCG/ACT nasal spray Place 1 spray into both nostrils daily as needed for rhinitis.   Yes [provider]  levothyroxine (SYNTHROID) 75 MCG tablet Take 75 mcg by mouth daily before breakfast.   Yes [provider]  metFORMIN (GLUCOPHAGE) 500 MG tablet Take 500 mg by mouth daily with breakfast.   Yes [provider]  mirtazapine (REMERON) 7.5 MG tablet Take 7.5 mg by mouth at bedtime.   Yes [provider]  Multiple Vitamin (MULTIVITAMIN WITH MINERALS) TABS Take 1 tablet by mouth daily with breakfast.   Yes [provider]  OVER THE COUNTER MEDICATION Take 120 mLs by mouth See admin instructions. MedPass 2.0- Drink 120 ml's by mouth in the morning and at bedtime   Yes [provider]  pantoprazole (PROTONIX) 40 MG tablet Take 40 mg by mouth in the morning.   Yes [provider]  polyethylene glycol  (MIRALAX / GLYCOLAX) 17 g packet Take 17 g by mouth See admin instructions. Mix 17 grams of powder into 6-8 ounces of water or juice and drink every morning   Yes [provider]  rosuvastatin (CRESTOR) 10 MG tablet Take 10 mg by mouth daily.   Yes [provider]  apixaban (ELIQUIS) 5 MG TABS tablet Take 2 tablets (10 mg total) by mouth 2 (two) times daily for 5 days. Patient not taking: Reported on 02/04/2022 11/16/21 02/04/22  Thurnell Lose, MD     Critical care time: 41 minutes    Freda Jackson, MD [place] Office: [phone]   See Amion for personal pager PCCM on call pager [phone] until 7pm. Please call Elink 7p-7a. [phone]

## 2022-02-04 NOTE — ED Triage Notes (Signed)
Patient from [facility] SNF for 3 days of decreased activity, decreased po intake and increased lethargy. Patient is alert to self, answers to voice. Mouth is very dry and lungs sounds junky.

## 2022-02-05 ENCOUNTER — Encounter (HOSPITAL_COMMUNITY): Payer: Self-pay | Admitting: Pulmonary Disease

## 2022-02-05 ENCOUNTER — Encounter (HOSPITAL_COMMUNITY)
Admission: EM | Disposition: A | Discharge status: Skilled Nursing Facility | Payer: Self-pay | Source: Skilled Nursing Facility | Attending: Family Medicine

## 2022-02-05 ENCOUNTER — Inpatient Hospital Stay (HOSPITAL_COMMUNITY): Payer: Medicare Other | Admitting: Anesthesiology

## 2022-02-05 DIAGNOSIS — G4730 Sleep apnea, unspecified: Secondary | ICD-10-CM

## 2022-02-05 DIAGNOSIS — D62 Acute posthemorrhagic anemia: Secondary | ICD-10-CM

## 2022-02-05 DIAGNOSIS — K269 Duodenal ulcer, unspecified as acute or chronic, without hemorrhage or perforation: Secondary | ICD-10-CM

## 2022-02-05 DIAGNOSIS — K449 Diaphragmatic hernia without obstruction or gangrene: Secondary | ICD-10-CM

## 2022-02-05 DIAGNOSIS — K922 Gastrointestinal hemorrhage, unspecified: Secondary | ICD-10-CM | POA: Diagnosis not present

## 2022-02-05 HISTORY — PX: ESOPHAGOGASTRODUODENOSCOPY (EGD) WITH PROPOFOL: SHX5813

## 2022-02-05 LAB — URINALYSIS, ROUTINE W REFLEX MICROSCOPIC
Bilirubin Urine: NEGATIVE
Glucose, UA: NEGATIVE mg/dL
Ketones, ur: NEGATIVE mg/dL
Nitrite: NEGATIVE
Protein, ur: NEGATIVE mg/dL
Specific Gravity, Urine: 1.014 (ref 1.005–1.030)
pH: 5 (ref 5.0–8.0)

## 2022-02-05 LAB — PHOSPHORUS
Phosphorus: 5.9 mg/dL — ABNORMAL HIGH (ref 2.5–4.6)
Phosphorus: 5.9 mg/dL — ABNORMAL HIGH (ref 2.5–4.6)

## 2022-02-05 LAB — LACTIC ACID, PLASMA: Lactic Acid, Venous: 1.5 mmol/L (ref 0.5–1.9)

## 2022-02-05 LAB — HEMOGLOBIN AND HEMATOCRIT, BLOOD
HCT: 25.6 % — ABNORMAL LOW (ref 39.0–52.0)
Hemoglobin: 8.2 g/dL — ABNORMAL LOW (ref 13.0–17.0)

## 2022-02-05 LAB — CBC
HCT: 29.1 % — ABNORMAL LOW (ref 39.0–52.0)
Hemoglobin: 9.4 g/dL — ABNORMAL LOW (ref 13.0–17.0)
MCH: 30.3 pg (ref 26.0–34.0)
MCHC: 32.3 g/dL (ref 30.0–36.0)
MCV: 93.9 fL (ref 80.0–100.0)
Platelets: 148 10*3/uL — ABNORMAL LOW (ref 150–400)
RBC: 3.1 MIL/uL — ABNORMAL LOW (ref 4.22–5.81)
RDW: 16.9 % — ABNORMAL HIGH (ref 11.5–15.5)
WBC: 18.1 10*3/uL — ABNORMAL HIGH (ref 4.0–10.5)
nRBC: 0.1 % (ref 0.0–0.2)

## 2022-02-05 LAB — BASIC METABOLIC PANEL
Anion gap: 8 (ref 5–15)
BUN: 110 mg/dL — ABNORMAL HIGH (ref 8–23)
CO2: 28 mmol/L (ref 22–32)
Calcium: 8.6 mg/dL — ABNORMAL LOW (ref 8.9–10.3)
Chloride: 113 mmol/L — ABNORMAL HIGH (ref 98–111)
Creatinine, Ser: 1.64 mg/dL — ABNORMAL HIGH (ref 0.61–1.24)
GFR, Estimated: 38 mL/min — ABNORMAL LOW (ref >60)
Glucose, Bld: 103 mg/dL — ABNORMAL HIGH (ref 70–99)
Potassium: 5 mmol/L (ref 3.5–5.1)
Sodium: 149 mmol/L — ABNORMAL HIGH (ref 135–145)

## 2022-02-05 LAB — MAGNESIUM
Magnesium: 2.1 mg/dL (ref 1.7–2.4)
Magnesium: 2.1 mg/dL (ref 1.7–2.4)

## 2022-02-05 LAB — GLUCOSE, CAPILLARY
Glucose-Capillary: 122 mg/dL — ABNORMAL HIGH (ref 70–99)
Glucose-Capillary: 99 mg/dL (ref 70–99)
Glucose-Capillary: 66 mg/dL — ABNORMAL LOW (ref 70–99)
Glucose-Capillary: 117 mg/dL — ABNORMAL HIGH (ref 70–99)
Glucose-Capillary: 80 mg/dL (ref 70–99)
Glucose-Capillary: 78 mg/dL (ref 70–99)
Glucose-Capillary: 63 mg/dL — ABNORMAL LOW (ref 70–99)
Glucose-Capillary: 97 mg/dL (ref 70–99)
Glucose-Capillary: 68 mg/dL — ABNORMAL LOW (ref 70–99)

## 2022-02-05 LAB — MRSA NEXT GEN BY PCR, NASAL: MRSA by PCR Next Gen: NOT DETECTED

## 2022-02-05 LAB — LIPASE, BLOOD: Lipase: 44 U/L (ref 11–51)

## 2022-02-05 SURGERY — ESOPHAGOGASTRODUODENOSCOPY (EGD) WITH PROPOFOL
Anesthesia: Monitor Anesthesia Care

## 2022-02-05 MED ORDER — DEXTROSE 50 % IV SOLN
INTRAVENOUS | Status: AC
  Filled 2022-02-05: qty 50

## 2022-02-05 MED ORDER — ALBUMIN HUMAN 5 % IV SOLN
25.0000 g | Freq: Once | INTRAVENOUS | Status: AC
  Administered 2022-02-06: 25 g via INTRAVENOUS
  Filled 2022-02-05: qty 500

## 2022-02-05 MED ORDER — SODIUM CHLORIDE 0.9 % IV SOLN
250.0000 mL | INTRAVENOUS | Status: DC
  Administered 2022-02-06: 250 mL via INTRAVENOUS

## 2022-02-05 MED ORDER — LIDOCAINE 2% (20 MG/ML) 5 ML SYRINGE
INTRAMUSCULAR | Status: DC | PRN
  Administered 2022-02-05: 60 mg via INTRAVENOUS

## 2022-02-05 MED ORDER — LACTATED RINGERS IV SOLN
INTRAVENOUS | Status: AC | PRN
  Administered 2022-02-05: 1000 mL via INTRAVENOUS

## 2022-02-05 MED ORDER — ORAL CARE MOUTH RINSE: 15.0000 mL | OROMUCOSAL | Status: DC | PRN

## 2022-02-05 MED ORDER — SODIUM CHLORIDE 0.9 % IV SOLN: INTRAVENOUS | Status: DC

## 2022-02-05 MED ORDER — NOREPINEPHRINE 4 MG/250ML-% IV SOLN
0.0000 ug/min | INTRAVENOUS | Status: DC
  Filled 2022-02-05: qty 250

## 2022-02-05 MED ORDER — DEXTROSE IN LACTATED RINGERS 5 % IV SOLN: INTRAVENOUS | Status: AC

## 2022-02-05 MED ORDER — DEXTROSE 50 % IV SOLN
12.5000 g | INTRAVENOUS | Status: AC
  Administered 2022-02-05: 12.5 g via INTRAVENOUS

## 2022-02-05 MED ORDER — PROPOFOL 10 MG/ML IV BOLUS
INTRAVENOUS | Status: DC | PRN
  Administered 2022-02-05: 10 mg via INTRAVENOUS

## 2022-02-05 MED ORDER — DEXTROSE 50 % IV SOLN
12.5000 g | Freq: Once | INTRAVENOUS | Status: AC
  Administered 2022-02-05: 12.5 g via INTRAVENOUS

## 2022-02-05 SURGICAL SUPPLY — 15 items

## 2022-02-05 NOTE — Anesthesia Preprocedure Evaluation (Addendum)
Anesthesia Evaluation  Patient identified by MRN, date of birth, ID band Patient confused  General Assessment Comment:Unresponsive to voice and sternal rub at baseline  Reviewed: Allergy & Precautions, NPO status , Patient's Chart, lab work & pertinent test results  Airway Mallampati: Unable to assess       Dental  (+) Dental Advisory Given   Pulmonary sleep apnea , former smoker, PE  recent admission 10/2021 for pneumonia and pulmonary emboli where he was started on eliquis    + decreased breath sounds      Cardiovascular hypertension (no meds), +CHF  + dysrhythmias Atrial Fibrillation  Rhythm:Regular Rate:Normal     Neuro/Psych PSYCHIATRIC DISORDERS Depression negative neurological ROS     GI/Hepatic Neg liver ROS, GERD  Controlled,GIB    Endo/Other  diabetes, Well Controlled, Type 2, Oral Hypoglycemic AgentsHypothyroidism a1c 6.3  Renal/GU Renal InsufficiencyRenal diseaseCr 1.64  negative genitourinary   Musculoskeletal  (+) Arthritis , Osteoarthritis,    Abdominal   Peds  Hematology  (+) Blood dyscrasia, anemia , Hb 8.2 this AM, from 6.0 yesterday    Anesthesia Other Findings   Reproductive/Obstetrics negative OB ROS                           Anesthesia Physical Anesthesia Plan  ASA: 4  Anesthesia Plan: MAC   Post-op Pain Management:    Induction:   PONV Risk Score and Plan: 2 and Propofol infusion and TIVA  Airway Management Planned: Natural Airway and Simple Face Mask  Additional Equipment: None  Intra-op Plan:   Post-operative Plan:   Informed Consent: I have reviewed the patients History and Physical, chart, labs and discussed the procedure including the risks, benefits and alternatives for the proposed anesthesia with the patient or authorized representative who has indicated his/her understanding and acceptance.     Dental advisory given and Consent reviewed with  POA  Plan Discussed with: CRNA  Anesthesia Plan Comments: (In preop, pt unarousable and very frail- spoke extensively w/ son who is POA- per son, during day he is often hypersomnolent. D/w son that patient is full code per his wishes. Discussed that he is high risk for anesthesia complications given his overall baseline)      Anesthesia Quick Evaluation

## 2022-02-05 NOTE — Transfer of Care (Signed)
Immediate Anesthesia Transfer of Care Note  Patient: Robert Beltran  Procedure(s) Performed: ESOPHAGOGASTRODUODENOSCOPY (EGD) WITH PROPOFOL  Patient Location: PACU  Anesthesia Type:MAC  Level of Consciousness: awake, alert  and oriented  Airway & Oxygen Therapy: Patient Spontanous Breathing and Patient connected to face mask oxygen  Post-op Assessment: Report given to RN and Post -op Vital signs reviewed and stable  Post vital signs: Reviewed and stable  Last Vitals:  Vitals Value Taken Time  BP 134/46 02/05/22 1210  Temp    Pulse 75 02/05/22 1211  Resp 21 02/05/22 1211  SpO2 100 % 02/05/22 1211  Vitals shown include unvalidated device data.  Last Pain:  Vitals:   02/05/22 1108  TempSrc: Temporal         Complications: No notable events documented.

## 2022-02-05 NOTE — Op Note (Signed)
[facility] Patient Name: Robert Beltran Procedure Date: 02/05/2022 MRN: [id number] Attending MD: Carol Ada , MD Date of Birth: 17-Dec-1923 CSN: [id number] Age: 88 Admit Type: Inpatient Procedure:                Upper GI endoscopy Indications:              Acute post hemorrhagic anemia, Melena Providers:                Carol Ada, MD, Ervin Knack, RN, [facility]                            Technician, Technician Referring MD:              Medicines:                Propofol per Anesthesia Complications:            No immediate complications. Estimated Blood Loss:     Estimated blood loss: none. Procedure:                Pre-Anesthesia Assessment:                           - Prior to the procedure, a History and Physical                            was performed, and patient medications and                            allergies were reviewed. The patient's tolerance of                            previous anesthesia was also reviewed. The risks                            and benefits of the procedure and the sedation                            options and risks were discussed with the patient.                            All questions were answered, and informed consent                            was obtained. Prior Anticoagulants: The patient has                            taken no previous anticoagulant or antiplatelet                            agents. ASA Grade Assessment: III - A patient with                            severe systemic disease. After reviewing the risks  and benefits, the patient was deemed in                            satisfactory condition to undergo the procedure.                           - Sedation was administered by an anesthesia                            professional. Deep sedation was attained.                           After obtaining informed consent, the endoscope was                            passed under  direct vision. Throughout the                            procedure, the patient's blood pressure, pulse, and                            oxygen saturations were monitored continuously. The                            [id number]-[id number] ([id number]) Olympus endoscope was introduced                            through the mouth, and advanced to the second part                            of duodenum. The upper GI endoscopy was                            accomplished without difficulty. The patient                            tolerated the procedure well. Scope In: Scope Out: Findings:      A 3 cm hiatal hernia was present.      The stomach was normal.      One non-bleeding superficial duodenal ulcer with a clean ulcer base       (Forrest Class III) was found in the duodenal bulb. The lesion was 6 mm       in largest dimension. Impression:               - 3 cm hiatal hernia.                           - Normal stomach.                           - Non-bleeding duodenal ulcer with a clean ulcer                            base (Forrest Class III).                           -  No specimens collected. Moderate Sedation:      Not Applicable - Patient had care per Anesthesia. Recommendation:           - Return patient to hospital ward for ongoing care.                           - Resume regular diet.                           - Continue present medications.                           - PPI QD indefinitely.                           - Hold anticoagulation for another 2-4 weeks, if                            possible.                           - Signing off. Procedure Code(s):        --- Professional ---                           [id number], Esophagogastroduodenoscopy, flexible,                            transoral; diagnostic, including collection of                            specimen(s) by brushing or washing, when performed                            (separate procedure) Diagnosis Code(s):        --- Professional  ---                           K26.9, Duodenal ulcer, unspecified as acute or                            chronic, without hemorrhage or perforation                           K44.9, Diaphragmatic hernia without obstruction or                            gangrene                           D62, Acute posthemorrhagic anemia                           K92.1, Melena (includes Hematochezia) CPT copyright 2019 [employer]. All rights reserved. The codes documented in this report are preliminary and upon coder review may  be revised to meet current compliance requirements. Carol Ada, MD Carol Ada, MD 02/05/2022 12:04:01 PM This report has been signed electronically. Number of  Addenda: 0

## 2022-02-05 NOTE — Consult Note (Signed)
UNASSIGNED CONSULT  Reason for Consult: GI Bleed Referring Physician: [facility]  Domenick Bookbinder HPI: This is a 88 year old male with a PMH of GERD, HTN, DM, PE (10/2021) and BPH admitted for an anemia, melenic stools, and decreased activity.  He was transferred from SNF after having melenic stools and a decline his HGB down to 6 g/dL.  On 11/15/2021 his HGB was at 13.0 g/dL, which is around his baseline.  The patient was started on Eliquis for a PE.  This history of obtained form the chart.  Past Medical History:  Diagnosis Date   Arthritis    Benign prostate hyperplasia    Cancer ([place])    testicular cancer 1964   Chronic back pain    Depression    Diabetes mellitus without complication (HCC)    DJD (degenerative joint disease)    GERD (gastroesophageal reflux disease)    HLD (hyperlipidemia)    Hypertension    sees Dr. Crist Infante,     Past Surgical History:  Procedure Laterality Date   APPENDECTOMY     1995   EYE SURGERY     bilateral surgery   LUMBAR LAMINECTOMY/DECOMPRESSION MICRODISCECTOMY N/A 08/29/2012   Procedure: LUMBAR LAMINECTOMY/DECOMPRESSION MICRODISCECTOMY 2 LEVELS;  Surgeon: Eustace Moore, MD;  Location: [place] [facility];  Service: Neurosurgery;  Laterality: N/A;   SHOULDER SURGERY     right    Family History  Problem Relation Age of Onset   Heart attack Mother    Colon cancer Brother     Social History:  reports that he has quit smoking. He has never used smokeless tobacco. He reports current alcohol use. He reports that he does not use drugs.  Allergies:  Allergies  Allergen Reactions   Aspirin Other (See Comments)    GI bleed, abdominal pain and "allergic," per [facility]   Nsaids Other (See Comments)    GI distress and "allergic," per MAR   Other Nausea Only and Other (See Comments)    "Reaction to Coricidin"   Bactrim [Sulfamethoxazole-Trimethoprim] Rash    Medications: Scheduled:  [MAR Hold] Chlorhexidine Gluconate Cloth  6 each Topical Daily    [MAR Hold] insulin aspart  0-9 Units Subcutaneous Q4H   [MAR Hold] mouth rinse  15 mL Mouth Rinse 4 times per day   [MAR Hold] pantoprazole (PROTONIX) IV  40 mg Intravenous Q12H   [MAR Hold] sodium zirconium cyclosilicate  10 g Oral Once   Continuous:  [MAR Hold] sodium chloride Stopped (02/05/22 0112)    Results for orders placed or performed during the hospital encounter of 02/04/22 (from the past 24 hour(s))  CBG monitoring, ED     Status: Abnormal   Collection Time: 02/04/22  5:53 PM  Result Value Ref Range   Glucose-Capillary 137 (H) 70 - 99 mg/dL  Comprehensive metabolic panel     Status: Abnormal   Collection Time: 02/04/22  6:15 PM  Result Value Ref Range   Sodium 147 (H) 135 - 145 mmol/L   Potassium 6.0 (H) 3.5 - 5.1 mmol/L   Chloride 110 98 - 111 mmol/L   CO2 28 22 - 32 mmol/L   Glucose, Bld 162 (H) 70 - 99 mg/dL   BUN 119 (H) 8 - 23 mg/dL   Creatinine, Ser 1.98 (H) 0.61 - 1.24 mg/dL   Calcium 9.4 8.9 - 10.3 mg/dL   Total Protein 5.8 (L) 6.5 - 8.1 g/dL   Albumin 3.0 (L) 3.5 - 5.0 g/dL   AST 23 15 -  41 U/L   ALT 10 0 - 44 U/L   Alkaline Phosphatase 44 38 - 126 U/L   Total Bilirubin 0.4 0.3 - 1.2 mg/dL   GFR, Estimated 31 (L) >60 mL/min   Anion gap 9 5 - 15  Lactic acid, plasma     Status: Abnormal   Collection Time: 02/04/22  6:15 PM  Result Value Ref Range   Lactic Acid, Venous 2.3 (HH) 0.5 - 1.9 mmol/L  CBC with Differential     Status: Abnormal   Collection Time: 02/04/22  6:15 PM  Result Value Ref Range   WBC 31.3 (H) 4.0 - 10.5 K/uL   RBC 1.92 (L) 4.22 - 5.81 MIL/uL   Hemoglobin 6.0 (LL) 13.0 - 17.0 g/dL   HCT 19.1 (L) 39.0 - 52.0 %   MCV 99.5 80.0 - 100.0 fL   MCH 31.3 26.0 - 34.0 pg   MCHC 31.4 30.0 - 36.0 g/dL   RDW 15.8 (H) 11.5 - 15.5 %   Platelets 215 150 - 400 K/uL   nRBC 0.0 0.0 - 0.2 %   Neutrophils Relative % 90 %   Neutro Abs 28.1 (H) 1.7 - 7.7 K/uL   Lymphocytes Relative 2 %   Lymphs Abs 0.7 0.7 - 4.0 K/uL   Monocytes Relative 7 %    Monocytes Absolute 2.2 (H) 0.1 - 1.0 K/uL   Eosinophils Relative 0 %   Eosinophils Absolute 0.0 0.0 - 0.5 K/uL   Basophils Relative 0 %   Basophils Absolute 0.0 0.0 - 0.1 K/uL   Immature Granulocytes 1 %   Abs Immature Granulocytes 0.28 (H) 0.00 - 0.07 K/uL  Protime-INR     Status: Abnormal   Collection Time: 02/04/22  6:15 PM  Result Value Ref Range   Prothrombin Time 24.6 (H) 11.4 - 15.2 seconds   INR 2.2 (H) 0.8 - 1.2  Culture, blood (Routine x 2)     Status: None (Preliminary result)   Collection Time: 02/04/22  6:15 PM   Specimen: BLOOD  Result Value Ref Range   Specimen Description      BLOOD LEFT ANTECUBITAL Performed at [facility], [place] [address] [place], [place] [address]    Special Requests      BOTTLES DRAWN AEROBIC AND ANAEROBIC Blood Culture adequate volume Performed at [facility] [address] [place], [place] [address]    Culture      NO GROWTH < 12 HOURS Performed at [facility] [address] [place], [place] [address]    Report Status PENDING   Resp Panel by RT-PCR (Flu A&B, Covid) Anterior Nasal Swab     Status: None   Collection Time: 02/04/22  6:32 PM   Specimen: Anterior Nasal Swab  Result Value Ref Range   SARS Coronavirus 2 by RT PCR NEGATIVE NEGATIVE   Influenza A by PCR NEGATIVE NEGATIVE   Influenza B by PCR NEGATIVE NEGATIVE  Culture, blood (Routine x 2)     Status: None (Preliminary result)   Collection Time: 02/04/22  7:10 PM   Specimen: BLOOD  Result Value Ref Range   Specimen Description      BLOOD SITE NOT SPECIFIED Performed at [facility] [address], [place], [place] [address]    Special Requests      BOTTLES DRAWN AEROBIC AND ANAEROBIC Blood Culture results may not be optimal due to an inadequate volume of blood received in culture bottles Performed at [facility], [place] [address], [place], [place] [address]    Culture      NO GROWTH <  12 HOURS Performed at [facility] [address] [place], [place] [address]    Report Status PENDING   Prepare RBC (crossmatch)     Status: None   Collection Time: 02/04/22  7:39 PM  Result Value Ref Range   Order Confirmation      ORDER PROCESSED BY BLOOD BANK Performed at [facility], [place] [address], [place], [place] [address]   ABO/Rh     Status: None   Collection Time: 02/04/22  8:00 PM  Result Value Ref Range   ABO/RH(D)      A NEG Performed at [facility] [address], [place], [place] [address]   APTT     Status: Abnormal   Collection Time: 02/04/22  8:15 PM  Result Value Ref Range   aPTT 41 (H) 24 - 36 seconds  Prepare fresh frozen plasma     Status: None (Preliminary result)   Collection Time: 02/04/22  8:19 PM  Result Value Ref Range   Unit Number [id number]    Blood Component Type THAWED PLASMA    Unit division 00    Status of Unit ISSUED    Transfusion Status      OK TO TRANSFUSE Performed at [facility] [address] [place] [address] [place], [place] [address]   POC occult blood, ED     Status: Abnormal   Collection Time: 02/04/22  8:20 PM  Result Value Ref Range   Fecal Occult Bld POSITIVE (A) NEGATIVE  Lactic acid, plasma     Status: Abnormal   Collection Time: 02/04/22  8:35 PM  Result Value Ref Range   Lactic Acid, Venous 2.7 (HH) 0.5 - 1.9 mmol/L  Type and screen     Status: None (Preliminary result)   Collection Time: 02/04/22  8:50 PM  Result Value Ref Range   ABO/RH(D) A NEG    Antibody Screen NEG    Sample Expiration 02/07/2022,2359    Unit Number [id number]    Blood Component Type RED CELLS,LR    Unit division 00    Status of Unit ISSUED    Transfusion Status OK TO TRANSFUSE    Crossmatch Result      Compatible Performed at [facility] [address] [place], [place] [address]    Unit Number [id number]    Blood Component Type RED CELLS,LR    Unit  division 00    Status of Unit ISSUED    Transfusion Status OK TO TRANSFUSE    Crossmatch Result Compatible   MRSA Next Gen by PCR, Nasal     Status: None   Collection Time: 02/04/22 10:23 PM   Specimen: Urine, Clean Catch; Nasal Swab  Result Value Ref Range   MRSA by PCR Next Gen NOT DETECTED NOT DETECTED  Glucose, capillary     Status: None   Collection Time: 02/05/22 12:29 AM  Result Value Ref Range   Glucose-Capillary 99 70 - 99 mg/dL   Comment 1 Notify RN    Comment 2 Document in Chart   Urinalysis, Routine w reflex microscopic Urine, Clean Catch     Status: Abnormal   Collection Time: 02/05/22  1:20 AM  Result Value Ref Range   Color, Urine YELLOW YELLOW   APPearance HAZY (A) CLEAR   Specific Gravity, Urine 1.014 1.005 - 1.030   pH 5.0 5.0 -  8.0   Glucose, UA NEGATIVE NEGATIVE mg/dL   Hgb urine dipstick MODERATE (A) NEGATIVE   Bilirubin Urine NEGATIVE NEGATIVE   Ketones, ur NEGATIVE NEGATIVE mg/dL   Protein, ur NEGATIVE NEGATIVE mg/dL   Nitrite NEGATIVE NEGATIVE   Leukocytes,Ua TRACE (A) NEGATIVE   RBC / HPF 0-5 0 - 5 RBC/hpf   WBC, UA 6-10 0 - 5 WBC/hpf   Bacteria, UA RARE (A) NONE SEEN   Squamous Epithelial / LPF 0-5 0 - 5   Mucus PRESENT    Hyaline Casts, UA PRESENT   Glucose, capillary     Status: Abnormal   Collection Time: 02/05/22  3:40 AM  Result Value Ref Range   Glucose-Capillary 122 (H) 70 - 99 mg/dL   Comment 1 Notify RN    Comment 2 Document in Chart   Glucose, capillary     Status: Abnormal   Collection Time: 02/05/22  8:06 AM  Result Value Ref Range   Glucose-Capillary 66 (L) 70 - 99 mg/dL   Comment 1 Notify RN    Comment 2 Document in Chart      US RENAL  Result Date: 02/05/2022 CLINICAL DATA:  [id number].  Acute kidney injury. EXAM: RENAL / URINARY TRACT ULTRASOUND COMPLETE COMPARISON:  None Available. FINDINGS: Technical note: Difficult exam due to body habitus, bowel gas, and level of patient responsiveness. Right Kidney: Renal measurements:  Small in length measuring 7.2 x 5.1 x 5.0 cm = volume: 97.6 mL. Echogenicity within normal limits but with slight general cortical thinning. No mass, stones or hydronephrosis visualized. Left Kidney: Renal measurements: 9.0 x 4.0 x 4.2 cm = volume: 79.9 mL. Echogenicity within normal limits. No mass or stones visualized. There is mild hydronephrosis. Bladder: Appears normal for degree of bladder distention, but does contain a small volume of dependent hypoechoic layering debris. Bladder volume measurements were not obtained. Ureteral jets were not seen during the study. Other: None. IMPRESSION: 1. Slightly small kidneys, minimal cortical thinning on the right but no increased cortical echogenicity either side. 2. Mild left hydronephrosis. No right hydronephrosis. No visible intrarenal stones. 3. No significant bladder thickening but there is layering debris in the bladder which could be hemorrhagic or infectious etiology. 4. Ureteral jets not seen during the exam. Electronically Signed   By: Telford Nab M.D.   On: 02/05/2022 00:39   DG Chest Port 1 View  Result Date: 02/04/2022 CLINICAL DATA:  Increased lethargy EXAM: PORTABLE CHEST 1 VIEW COMPARISON:  Previous studies including the examination of 11/15/2021 FINDINGS: There is poor inspiration. Cardiac size is within normal limits. Thoracic aorta is tortuous and ectatic. There is improvement in the aeration of both lungs. There are residual linear densities in both lower lung fields. Right hemidiaphragm is elevated. There is no significant pleural effusion or pneumothorax. Severe degenerative changes are noted in both shoulders. IMPRESSION: There is interval improvement in aeration of both lungs. Residual linear densities in both lower lung fields suggest scarring or subsegmental atelectasis. No new focal infiltrates are seen. There is no pleural effusion. Severe degenerative changes are noted in both shoulders. Electronically Signed   By: Elmer Picker  M.D.   On: 02/04/2022 19:00    ROS:  As stated above in the HPI otherwise negative.  Blood pressure 112/75, pulse 86, temperature 97.7 F (36.5 C), resp. rate 13, height '5\' 10"'$  (1.778 m), weight 69.2 kg, SpO2 95 %.    PE: Gen: Unresponsive to sternal rub Lungs: CTA Bilaterally CV: RRR without M/G/R ABD: Soft,  NTND, +BS Ext: No C/C/E  Assessment/Plan: 1) Melena. 2) Anemia. 3) Heme positive stool. 4) AMS.   The patient is somnolent and he does not respond to sternal rub.  He is full code and his son wishes to proceed.  Light sedation, per anesthesia will be applied from the EGD.  Plan: 1) EGD.  Franchon Ketterman D 02/05/2022, 8:15 AM

## 2022-02-05 NOTE — TOC Initial Note (Signed)
Transition of Care [facility]) - Initial/Assessment Note    Patient Details  Name: Robert Beltran MRN: [id number] Date of Birth: [birth year]  Transition [facility]) CM/SW Contact:    Robert Phi, RN Phone Number: 02/05/2022, 11:16 AM  Clinical Narrative:Confirmed w/[place] rep Robert Beltran/& Robert Beltran(son) return back to [facility] when stable. Await PT cons prior auth.                   Expected Discharge Plan: [place] Barriers to Discharge: Continued Medical Work up   Patient Goals and CMS Choice Patient states their goals for this hospitalization and ongoing recovery are:: Return back to [facility] SNF CMS Medicare.gov Compare Post Acute Care list provided to:: Patient Represenative (must comment) Robert Beltran) Choice offered to / list presented to : Adult Children Arts administrator))  Expected Discharge Plan and Services Expected Discharge Plan: [place]   Discharge Planning Services: CM Consult Post Acute Care Choice: [place] Living arrangements for the past 2 months: [place]                                      Prior Living Arrangements/Services Living arrangements for the past 2 months: [place] Lives with:: Facility Resident Patient language and need for interpreter reviewed:: Yes Do you feel safe going back to the place where you live?: Yes      Need for Family Participation in Patient Care: Yes (Comment) Care giver support system in place?: Yes (comment) Current home services: DME (w/c) Criminal Activity/Legal Involvement Pertinent to Current Situation/Hospitalization: No - Comment as needed  Activities of Daily Living      Permission Sought/Granted Permission sought to share information with : Case Manager Permission granted to share information with : Yes, Verbal Permission Granted  Share Information with NAME: Case Manager           Emotional Assessment Appearance::  Appears stated age Attitude/Demeanor/Rapport: Gracious Affect (typically observed): Accepting Orientation: : Oriented to Self Alcohol / Substance Use: Not Applicable Psych Involvement: No (comment)  Admission diagnosis:  Hyperkalemia [E87.5] GI bleed [K92.2] On continuous oral anticoagulation [Z79.01] Gastrointestinal hemorrhage, unspecified gastrointestinal hemorrhage type [K92.2] Anemia, unspecified type [D64.9] Leukocytosis, unspecified type [D72.829] Patient Active Problem List   Diagnosis Date Noted   GI bleed 02/04/2022   Severe sepsis ([place]) 11/13/2021   Acute respiratory failure with hypoxia ([place]) 11/13/2021   Fall at home, initial encounter 11/13/2021   HLD (hyperlipidemia)    Hypertension    Chronic diastolic CHF (congestive heart failure) ([place])    Multifocal pneumonia 11/12/2021   Severe obstructive sleep apnea-hypopnea syndrome 10/29/2019   Type 2 diabetes mellitus with stage 3a chronic kidney disease, without long-term current use of insulin ([place]) 09/17/2019   Excessive daytime sleepiness 09/17/2019   Snoring 09/17/2019   Muscle cramp, nocturnal 09/17/2019   Spinal stenosis of lumbar region with neurogenic claudication 09/17/2019   Nocturia more than twice per night 09/17/2019   Left bundle branch block 08/21/2012   PCP:  Garwin Brothers, MD Pharmacy:   [facility] - [place], [place] [address] Phone: [phone] Fax: [phone]     Social Determinants of Health (SDOH) Interventions    Readmission Risk Interventions     No data to display

## 2022-02-05 NOTE — Progress Notes (Signed)
eLink Physician-Brief Progress Note Patient Name: Robert Beltran DOB: July 03, 1923 MRN: [id number]   Date of Service  02/05/2022  HPI/Events of Note  Patient with minimal oral intake and marginal blood sugars.  eICU Interventions  D 5 % LR gtt ordered to run at 60 ml / hour.        Kerry Kass Lion Fernandez 02/05/2022, 10:11 PM

## 2022-02-05 NOTE — Progress Notes (Signed)
eLink Physician-Brief Progress Note Patient Name: Robert Beltran DOB: Apr 19, 1924 MRN: [id number]   Date of Service  02/05/2022  HPI/Events of Note  Patient with hypotension in the context of a large brown / black stool, no frank hemorrhage (BP 88/22, MAP 43). Last hemoglobin 9.4 gm / dl.  eICU Interventions  Albumin 5 % 500 ml iv bolus stat, stat H & H,  Peripheral Norepinephrine gtt, if Hemoglobin < 8 gm will transfuse PRBC.        Frederik Pear 02/05/2022, 11:43 PM

## 2022-02-05 NOTE — Anesthesia Postprocedure Evaluation (Signed)
Anesthesia Post Note  Patient: Robert Beltran  Procedure(s) Performed: ESOPHAGOGASTRODUODENOSCOPY (EGD) WITH PROPOFOL     Patient location during evaluation: PACU Anesthesia Type: MAC Level of consciousness: awake and alert Pain management: pain level controlled Vital Signs Assessment: post-procedure vital signs reviewed and stable Respiratory status: spontaneous breathing, nonlabored ventilation and respiratory function stable Cardiovascular status: blood pressure returned to baseline and stable Postop Assessment: no apparent nausea or vomiting Anesthetic complications: no   No notable events documented.  Last Vitals:  Vitals:   02/05/22 1210 02/05/22 1220  BP: (!) 134/46 (!) 121/40  Pulse: 83 85  Resp: (!) 23 (!) 21  Temp: 36.6 C   SpO2: 100% 100%    Last Pain:  Vitals:   02/05/22 1220  TempSrc:   PainSc: 0-No pain                 Pervis Hocking

## 2022-02-05 NOTE — Progress Notes (Signed)
eLink Physician-Brief Progress Note Patient Name: Robert Beltran DOB: April 01, 1924 MRN: [id number]   Date of Service  02/05/2022  HPI/Events of Note  12 Lead ECG done to evaluate irregular rhythm on bedside monitor reveals: Sinus rhythm with 1st degree A-V block with occasional Premature ventricular complexes Left axis deviation Low voltage QRS Septal infarct , age undetermined ST & T wave abnormality, consider lateral ischemia  eICU Interventions  Continue present management.      Intervention Category Major Interventions: Arrhythmia - evaluation and management  Sarely Stracener [place] 02/05/2022, 1:08 AM

## 2022-02-05 NOTE — Progress Notes (Signed)
PT Cancellation Note  Patient Details Name: HONORIO DEVOL MRN: [id number] DOB: 12/27/1923   Cancelled Treatment:    Reason Eval/Treat Not Completed: Medical issues which prohibited therapy, post EGD today, sleeping, comes from SNF.Will check back tomorrow.  [place] Office [phone] Weekend pager-[phone]    Claretha [place] 02/05/2022, 2:47 PM

## 2022-02-05 NOTE — Progress Notes (Signed)
NAME:  Robert Beltran, MRN:  [id number], DOB:  Oct 07, 1923, LOS: 1 ADMISSION DATE:  02/04/2022, CONSULTATION DATE:  02/04/22 REFERRING MD:  Christ Kick CHIEF COMPLAINT:  GI Bleed   History of Present Illness:  Robert Beltran is a 88 year old male with BPH, DMII, GERD, hypertension and recent admission 10/2021 for pneumonia and pulmonary emboli where he was started on eliquis. He presented today from his SNF where he was noted to have 3 days of decreased activity, oral intake and dark bowel movements. His hemoglobin was to be dropping over the past couple of days.   In the ER, labs showed hemoglobin of 6g/dL, wbc count 31.3, BUN 119, Cr 1.98 (baseline .9 - 1.01), K 6, Na 147 and lactic acid of 2.3 > 2.7. He was noted to have dark melanotic stool.   PCCM consulted for admission for concern of upper GI bleed.  Pertinent  Medical History  BPH  Arthritis  Testicular Cancer - 1964 Chronic Back Pain  DJD Depression  DM  GERD HTN  HLD   Significant Hospital Events: Including procedures, antibiotic start and stop dates in addition to other pertinent events   8/10 admitted to ICU for upper GI bleed  Interim History / Subjective:  Afebrile / WBC 31.3  On 2L [place]  Glucose trend - 103-122  I/O 420 ml UOP since admit  EGD pending   Objective   Blood pressure (!) 133/39, pulse 81, temperature (!) 96.3 F (35.7 C), temperature source Axillary, resp. rate (!) 21, height '5\' 10"'$  (1.778 m), weight 69.2 kg, SpO2 100 %.        Intake/Output Summary (Last 24 hours) at 02/05/2022 0933 Last data filed at 02/05/2022 0800 Gross per 24 hour  Intake 900 ml  Output 420 ml  Net 480 ml   Filed Weights   02/04/22 2035 02/05/22 0000 02/05/22 0500  Weight: 71.5 kg 67.6 kg 69.2 kg    Examination: General: frail elderly adult male lying in bed in NAD HEENT: MM pink/moist, anicteric, pupils 103m reactive  Neuro: Lying in bed with eyes closed, reportedly has days/nights switched at facility, sleeping / stirs  with touch but does not interact  CV: s1s2 RRR, 1st degree AVB with PVC's, no m/r/g PULM: non-labored at rest, [place] in mouth as sleeping with mouth open, lungs clear with good air entry bilaterlaly  GI: soft, bsx4 active  Extremities: warm/dry, no edema  Skin: no rashes or lesions  Resolved Hospital Problem list     Assessment & Plan:   Acute GI Bleed Acute Blood Loss Anemia -K-centra given in ER, 2 units PRBC + 1 FFP -hold anticoagulation  -appreciate GI evaluation, pending EGD 8/11  -PPI BID  -follow CBC Q6  -transfuse for Hgb <7% or further concerns for bleeding  -has two PIV's   Acute Kidney Injury Hypernatremia, mild Hyperkalemia -Fluid resuscitation with PRBC's  -s/p lokelma x1, improved K+ -improved renal function with resuscitation  -Trend BMP / urinary output -Replace electrolytes as indicated -Avoid nephrotoxic agents, ensure adequate renal perfusion -Renal [place]ore[place]ith mild left hydronephrosis, no right, no visible stones. Layering debris in bladder which could be infectious vs hemorrhagic   Lactic Acidosis -resuscitation as above -follow LA trend to clearance   Hx of Pulmonary Emboli Hx of subsegmental PE 10/2021  -he does not need further anticoagulation for this PE  DMII -SSI, sensitive scale   Chronic Diastolic Heart Failure Hx of HTN -monitor volume status  -hold home antihypertensives 8/11 -consider gentle  diuresis 8/12   Best Practice (right click and "Reselect all SmartList Selections" daily)  Diet/type: NPO DVT prophylaxis: SCD GI prophylaxis: PPI Lines: N/A Foley:  N/A Code Status:  full code Last date of multidisciplinary goals of care discussion: updated 8/10 via phone.  Will update on arrival 8/11, no family present on am rounds  Critical care time: [place], MSN, APRN, NP-C, AGACNP-BC [facility] 02/05/2022, 9:34 AM   Please see [email or web address] for pager details.   From 7A-7P if no response, please call  [phone] After hours, please call ELink [phone]

## 2022-02-06 ENCOUNTER — Encounter (HOSPITAL_COMMUNITY): Payer: Self-pay | Admitting: Gastroenterology

## 2022-02-06 DIAGNOSIS — Z86711 Personal history of pulmonary embolism: Secondary | ICD-10-CM | POA: Diagnosis not present

## 2022-02-06 DIAGNOSIS — I5032 Chronic diastolic (congestive) heart failure: Secondary | ICD-10-CM | POA: Diagnosis not present

## 2022-02-06 DIAGNOSIS — E870 Hyperosmolality and hypernatremia: Secondary | ICD-10-CM

## 2022-02-06 DIAGNOSIS — D696 Thrombocytopenia, unspecified: Secondary | ICD-10-CM

## 2022-02-06 DIAGNOSIS — D72829 Elevated white blood cell count, unspecified: Secondary | ICD-10-CM

## 2022-02-06 DIAGNOSIS — N179 Acute kidney failure, unspecified: Secondary | ICD-10-CM

## 2022-02-06 DIAGNOSIS — E872 Acidosis, unspecified: Secondary | ICD-10-CM

## 2022-02-06 DIAGNOSIS — Z7189 Other specified counseling: Secondary | ICD-10-CM

## 2022-02-06 DIAGNOSIS — Z515 Encounter for palliative care: Secondary | ICD-10-CM

## 2022-02-06 DIAGNOSIS — E875 Hyperkalemia: Secondary | ICD-10-CM

## 2022-02-06 DIAGNOSIS — K922 Gastrointestinal hemorrhage, unspecified: Secondary | ICD-10-CM | POA: Diagnosis not present

## 2022-02-06 LAB — GLUCOSE, CAPILLARY
Glucose-Capillary: 105 mg/dL — ABNORMAL HIGH (ref 70–99)
Glucose-Capillary: 106 mg/dL — ABNORMAL HIGH (ref 70–99)
Glucose-Capillary: 48 mg/dL — ABNORMAL LOW (ref 70–99)
Glucose-Capillary: 78 mg/dL (ref 70–99)
Glucose-Capillary: 89 mg/dL (ref 70–99)
Glucose-Capillary: 94 mg/dL (ref 70–99)
Glucose-Capillary: 96 mg/dL (ref 70–99)

## 2022-02-06 LAB — CBC
HCT: 25.3 % — ABNORMAL LOW (ref 39.0–52.0)
HCT: 30.2 % — ABNORMAL LOW (ref 39.0–52.0)
Hemoglobin: 8.1 g/dL — ABNORMAL LOW (ref 13.0–17.0)
Hemoglobin: 9.7 g/dL — ABNORMAL LOW (ref 13.0–17.0)
MCH: 30.7 pg (ref 26.0–34.0)
MCH: 30.7 pg (ref 26.0–34.0)
MCHC: 32 g/dL (ref 30.0–36.0)
MCHC: 32.1 g/dL (ref 30.0–36.0)
MCV: 95.6 fL (ref 80.0–100.0)
MCV: 95.8 fL (ref 80.0–100.0)
Platelets: 142 10*3/uL — ABNORMAL LOW (ref 150–400)
Platelets: 142 10*3/uL — ABNORMAL LOW (ref 150–400)
RBC: 2.64 MIL/uL — ABNORMAL LOW (ref 4.22–5.81)
RBC: 3.16 MIL/uL — ABNORMAL LOW (ref 4.22–5.81)
RDW: 16.2 % — ABNORMAL HIGH (ref 11.5–15.5)
RDW: 16.8 % — ABNORMAL HIGH (ref 11.5–15.5)
WBC: 13.1 10*3/uL — ABNORMAL HIGH (ref 4.0–10.5)
WBC: 14 10*3/uL — ABNORMAL HIGH (ref 4.0–10.5)
nRBC: 0 % (ref 0.0–0.2)
nRBC: 0 % (ref 0.0–0.2)

## 2022-02-06 LAB — PREPARE FRESH FROZEN PLASMA: Unit division: 0

## 2022-02-06 LAB — BASIC METABOLIC PANEL
Anion gap: 8 (ref 5–15)
BUN: 89 mg/dL — ABNORMAL HIGH (ref 8–23)
CO2: 28 mmol/L (ref 22–32)
Calcium: 8.6 mg/dL — ABNORMAL LOW (ref 8.9–10.3)
Chloride: 117 mmol/L — ABNORMAL HIGH (ref 98–111)
Creatinine, Ser: 1.43 mg/dL — ABNORMAL HIGH (ref 0.61–1.24)
GFR, Estimated: 45 mL/min — ABNORMAL LOW (ref >60)
Glucose, Bld: 111 mg/dL — ABNORMAL HIGH (ref 70–99)
Potassium: 4.2 mmol/L (ref 3.5–5.1)
Sodium: 153 mmol/L — ABNORMAL HIGH (ref 135–145)

## 2022-02-06 LAB — PREPARE RBC (CROSSMATCH)

## 2022-02-06 LAB — BPAM FFP
Blood Product Expiration Date: 202308152359
ISSUE DATE / TIME: 202308110045
Unit Type and Rh: 600

## 2022-02-06 MED ORDER — HALOPERIDOL LACTATE 5 MG/ML IJ SOLN
INTRAMUSCULAR | Status: AC
  Filled 2022-02-06: qty 1

## 2022-02-06 MED ORDER — HALOPERIDOL LACTATE 5 MG/ML IJ SOLN
1.0000 mg | Freq: Four times a day (QID) | INTRAMUSCULAR | Status: DC | PRN
  Administered 2022-02-06 – 2022-02-15 (×7): 1 mg via INTRAVENOUS
  Filled 2022-02-06 (×6): qty 1

## 2022-02-06 MED ORDER — DEXTROSE 50 % IV SOLN
25.0000 g | INTRAVENOUS | Status: AC
  Administered 2022-02-06: 25 g via INTRAVENOUS

## 2022-02-06 MED ORDER — SODIUM CHLORIDE 0.9% IV SOLUTION: Freq: Once | INTRAVENOUS | Status: AC

## 2022-02-06 MED ORDER — DEXTROSE 50 % IV SOLN
INTRAVENOUS | Status: AC
  Filled 2022-02-06: qty 50

## 2022-02-06 NOTE — Evaluation (Signed)
Clinical/Bedside Swallow Evaluation Patient Details  Name: Robert Beltran MRN: [id number] Date of Birth: 1923/09/30  Today's Date: 02/06/2022 Time: SLP Start Time (ACUTE ONLY): 34 SLP Stop Time (ACUTE ONLY): 5465 SLP Time Calculation (min) (ACUTE ONLY): 42 min  Past Medical History:  Past Medical History:  Diagnosis Date   Arthritis    Benign prostate hyperplasia    Cancer ([place])    testicular cancer 1964   Chronic back pain    Depression    Diabetes mellitus without complication (HCC)    DJD (degenerative joint disease)    GERD (gastroesophageal reflux disease)    HLD (hyperlipidemia)    Hypertension    sees Dr. Crist Infante,    Past Surgical History:  Past Surgical History:  Procedure Laterality Date   APPENDECTOMY     1995   ESOPHAGOGASTRODUODENOSCOPY (EGD) WITH PROPOFOL N/A 02/05/2022   Procedure: ESOPHAGOGASTRODUODENOSCOPY (EGD) WITH PROPOFOL;  Surgeon: Carol Ada, MD;  Location: [facility];  Service: Gastroenterology;  Laterality: N/A;   EYE SURGERY     bilateral surgery   LUMBAR LAMINECTOMY/DECOMPRESSION MICRODISCECTOMY N/A 08/29/2012   Procedure: LUMBAR LAMINECTOMY/DECOMPRESSION MICRODISCECTOMY 2 LEVELS;  Surgeon: Eustace Moore, MD;  Location: [facility];  Service: Neurosurgery;  Laterality: N/A;   SHOULDER SURGERY     right   HPI:  88 yo male adm to [facility] with GI bleed.  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  Swallow eval ordered.  Most recent MBS 10/2021 showed worsening pharyngeal swallow marked by delay, reduced tongue base retraction, pharyngeal constriction and anterior laryngeal movement - resulting in penetration and aspirtion *silent and audible*.  Throat clearing and delayed cough inconsistent with aspiration and cued cough ineffective to expel aspirate.  Chin tuck not helpful.  Small single boluses prevented  aspiration and dry swallow swallow assisted to decrease retention.  Per son, pt was on a regular/thin diet PTA.    Assessment / Plan / Recommendation  Clinical Impression  Patient know to have h/o dysphagia. obstructive in nature, due to his cervical osteophytes.  Today, he is fully alert and cooperative with frequent laughing and joking during session.  He is hyponasal and voice and speech are clear.  Pt's position is suboptimal as he is kyphotic - likely contribuiting to potential impaired esophageal clearance as well as mechanical respiratory compromise.  No focal CN defictis - but pt appears to have contusion on right lateral tongue as well as ulceration of lower right lip - causing SLP to question if he bit his tongue.  He denies discomfort.  Various textures provided after oral care including ice chips, water, orange juice, applesauce and graham cracker.  Significantly delayed swallow with single ice chips noted as pt states "I swallowed".  Frequent eructation noted during all intake - without initial s/s of aspiration with ice, tsps thin, nectar, applesauce nor graham cracker.  Once thin liquid introduced, pt with wet voice, frequent throat clearing and finally explosve coughing causing his face to turn red with over 2 minutes required for him to recover.  Question if pt may have slight worsening dysphagia from temporary edema from EGD *? if worsened due to osteophytes* but also question worsening esophageal deficits given recurrent eructation.  At this time, recommend be be npo x small single ice chips and needed medications with applesauce.  Will follow up next date to determine if pt is  appropriate for po diet and/or repeated instrumental swallow evaluation. SLP Visit Diagnosis: Dysphagia, pharyngeal phase (R13.13)    Aspiration Risk    severe   Diet Recommendation NPO;Ice chips PRN after oral care   Supervision: Full supervision/cueing for compensatory strategies    Other  Recommendations  Oral Care Recommendations: Oral care QID    Recommendations for follow up therapy are one component of a multi-disciplinary discharge planning process, led by the attending physician.  Recommendations may be updated based on patient status, additional functional criteria and insurance authorization.  Follow up Recommendations Follow physician's recommendations for discharge plan and follow up therapies      Assistance Recommended at Discharge Frequent or constant Supervision/Assistance  Functional Status Assessment Patient has had a recent decline in their functional status and/or demonstrates limited ability to make significant improvements in function in a reasonable and predictable amount of time  Frequency and Duration min 2x/week  2 weeks       Prognosis Prognosis for Safe Diet Advancement: Fair Barriers to Reach Goals: Severity of deficits;Cognitive deficits      Swallow Study   General Date of Onset: 02/06/22 HPI: 88 yo male adm to [facility] with GI bleed.  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  Swallow eval ordered.  Most recent MBS 10/2021 showed worsening pharyngeal swallow marked by delay, reduced tongue base retraction, pharyngeal constriction and anterior laryngeal movement - resulting in penetration and aspirtion *silent and audible*.  Throat clearing and delayed cough inconsistent with aspiration and cued cough ineffective to expel aspirate.  Chin tuck not helpful.  Small single boluses prevented aspiration and dry swallow swallow assisted to decrease retention.  Per son, pt was on a regular/thin diet PTA. Type of Study: Bedside Swallow Evaluation Previous Swallow Assessment: see HPI 10/2021 MBS and 05/2021 MBS Diet Prior to this Study: Regular;Thin liquids Temperature Spikes Noted: No Respiratory Status: Nasal cannula History of Recent  Intubation: No Behavior/Cognition: Alert;Cooperative Oral Cavity Assessment: Within Functional Limits Oral Care Completed by SLP: Yes (pt brushed his teeth with SLP providing assist) Oral Cavity - Dentition: Adequate natural dentition (some dentition missing) Vision: Functional for self-feeding Self-Feeding Abilities: Able to feed self Patient Positioning: Upright in bed Baseline Vocal Quality: Low vocal intensity Volitional Cough: Strong Volitional Swallow: Able to elicit (with effort)    Oral/Motor/Sensory Function Overall Oral Motor/Sensory Function: Within functional limits   Ice Chips Ice chips: Impaired Presentation: Spoon Pharyngeal Phase Impairments: Suspected delayed Swallow   Thin Liquid Thin Liquid: Impaired Presentation: Cup;Self Fed;Spoon Pharyngeal  Phase Impairments: Cough - Delayed Other Comments: explosive coughing toware end of evaluation, causing pt's face to turn red and obvious respiratory discomfort    Nectar Thick Nectar Thick Liquid: Within functional limits Presentation: Cup;Self Fed;Spoon   Honey Thick Honey Thick Liquid: Not tested   Puree Puree: Within functional limits Presentation: Self Fed;Spoon   Solid     Solid: Within functional limits Presentation: Self Fredirick Lathe 02/06/2022,5:28 PM  [name], MS [facility] SLP [place] Office [phone] Pager [phone]

## 2022-02-06 NOTE — Consult Note (Signed)
Palliative Care Consult Note                                  Date: 02/06/2022   Patient Name: Robert Beltran  DOB: 1923/09/21  MRN: [id number]  Age / Sex: 88 y.o., male  PCP: Robert Brothers, MD Referring Physician: Aline August, MD  Reason for Consultation: Establishing goals of care  HPI/Patient Profile: 88 y.o. male  with past medical history of diabetes type 2, GERD, BPH, hypertension, and recent admission in May 2023 for pneumonia and PE for which she was started on Eliquis.  Presented to [facility] ED on 02/04/2022 from SNF where he was noted to have 3 days of decreased activity, decreased oral intake, and dark bowel movements.  In the ED, labs showed hemoglobin of 6, WBC 31.3, BUN 119, creatinine 1.98.  He was noted to have dark melanotic stool. He was admitted to PCCM with concern of upper GI bleed.  He was started on IV PPI, Kcentra, and transfused 2 units PPR C and 1 unit FFP.  He underwent EGD on 02/05/2022 which showed nonbleeding duodenal ulcer.  [facility].  Palliative Medicine has been consulted for goals of care.  Past Medical History:  Diagnosis Date   Arthritis    Benign prostate hyperplasia    Cancer ([place])    testicular cancer 09/13/62   Chronic back pain    Depression    Diabetes mellitus without complication (HCC)    DJD (degenerative joint disease)    GERD (gastroesophageal reflux disease)    HLD (hyperlipidemia)    Hypertension    sees Dr. Crist Beltran,     Subjective:   I have reviewed medical records including EPIC notes, labs and imaging, and assessed the patient at bedside.  He is awake and alert.  He is asking for something to eat.  He is in bilateral soft mittens due to trying to pull out his IV last night.  I met with his son Robert Beltran in the waiting room to discuss diagnosis, prognosis, GOC, EOL wishes, disposition, and options.  I introduced Palliative Medicine as specialized medical care  for people living with serious illness. It focuses on providing relief from the symptoms and stress of a serious illness.   Created space and opportunity for family to explore thoughts and feelings regarding current medical situation. Values and goals of care important to patient and family were attempted to be elicited.  A discussion was had today regarding advanced directives. Concepts specific to code status, artifical feeding and hydration, continued IV antibiotics and rehospitalization was had.  The MOST form was introduced and discussed.  Questions and concerns addressed. Family encouraged to call with questions or concerns.    Life Review: Robert Beltran reflects that his father has had a great life.  He was a World War II veteran, serving in the [employer] on [employer] front.  After service, he worked for [employer] for 35 years.  He retired in 13-Sep-1982.  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  He also loves music and was part of a Glass blower/designer.  His wife passed away in 09/13/2004.  Then in 2012-09-13, he transitioned to [employer].  Functional Status: Robert Beltran shares that his father's functional status has been declining over the past year and a half.  After his  hospitalization in May 2023, he went to rehab at [facility].  At that time it became clear that he needed a higher level of care and therefore stayed at [facility] for long-term care.  He is now nonambulatory and requires full assistance for ADLs, other than he can feed himself.  Discussion: We discussed his current illness and what it means in the larger context of her ongoing co-morbidities. Discussed the natural trajectory of chronic illness, emphasizing that it is non-curable and progressive. Discussed that chronic illness results in decreased functional status over time, as patients do not usually return to previous baseline after having an exacerbation.   The difference  between full scope medical intervention and comfort care was considered. Robert Beltran shares that his father and living will that wants wishes for full scope interventions until he is at the point that he cannot interact with family.  We did discuss code status. Provided information on evidenced based poor outcomes in similar hospitalized patients, as the cause of the arrest is likely associated with chronic/terminal disease rather than a reversible acute cardio-pulmonary event. I explained that DNR/DNI can be a protective measure to keep [place] from harming the patient in their last moments of life. Robert Beltran does not agree with DNR as he does not feel this is consistent with his father's wishes.   Robert Beltran is adamant that his father does not want DNR status, and is clear in his desire to continue full code status.  Provided copy of "Hard Choices" book.  Discussed the importance of continued conversation with the medical team regarding overall plan of care and treatment options, ensuring decisions are within the context of the patients values and GOCs.   Review of Systems  Unable to perform ROS   Objective:   Primary Diagnoses: Present on Admission:  GI bleed  Hypertension  Chronic diastolic CHF (congestive heart failure) ([place])   Physical Exam Vitals reviewed.  Constitutional:      General: He is not in acute distress.    Comments: Frail, chronically ill-appearing  Pulmonary:     Effort: Pulmonary effort is normal.  Neurological:     Mental Status: He is alert. He is confused.     Motor: Weakness present.     Vital Signs:  BP (!) 131/50   Pulse 83   Temp 98.2 F (36.8 C) (Axillary)   Resp 19   Ht 5' 10"  (1.778 m)   Wt 67.1 kg   SpO2 95%   BMI 21.23 kg/m   Palliative Assessment/Data: PPS 20%     Assessment & Plan:   SUMMARY OF RECOMMENDATIONS   Full code Continue full scope interventions [facility]al [facility] Patient has a living will  that outlines specific wishes - I have asked Robert Beltran to bring a copy to the hospital PMT will continue to follow   Primary Decision Maker: Son Robert Beltran  Prognosis:  Unable to determine    Thank you for allowing [place] to participate in the care of Robert Beltran  MDM - High   Signed by: Elie Confer, NP Palliative Medicine Team  Team Phone # [phone]  For individual providers, please see AMION

## 2022-02-06 NOTE — Progress Notes (Signed)
An USGPIV (ultrasound guided PIV) has been placed for short-term vasopressor infusion. A correctly placed ivWatch must be used when administering Vasopressors. Should this treatment be needed beyond 72 hours, central line access should be obtained.  It will be the responsibility of the bedside nurse to follow best practice to prevent extravasations.   ?

## 2022-02-06 NOTE — Progress Notes (Signed)
PT Cancellation Note  Patient Details Name: Robert Beltran MRN: [id number] DOB: 1923/08/30   Cancelled Treatment:     PT order received but eval deferred this date.  Pt upset about NPO status and refuses participation, "Where is my soup, I'm to weak to move! Where is my soup?  I'll move with you when I'm ready and it won't be today!  Where is my soup?".  Will follow.   Robert Beltran 02/06/2022, 12:29 PM

## 2022-02-06 NOTE — Progress Notes (Signed)
PROGRESS NOTE    Robert Beltran  VFI:[id number] DOB: 02-May-1924 DOA: 02/04/2022 PCP: Garwin Brothers, MD   Brief Narrative:  88 year old male with history of BPH, diabetes mellitus type 2, hypertension, recent admission in 10/2021 for pneumonia and pulmonary embolism for which he was started on oral Eliquis presented with decreased activity/oral intake and dark bowel movements.  On presentation, hemoglobin was 6 with WBC of 31.3, creatinine of 1.98 (baseline of 0.9-1.01), potassium of 6, sodium 147, lactic acid of 2.3 then 2.7.  He was noted to have dark melanotic stool.  He was admitted to ICU under PCCM service.  He was started on IV PPI.  He was given Kcentra in the ER along with 2 units of PRBC and 1 unit of FFP.  GI was consulted.  He underwent EGD on 02/05/2022 which showed nonbleeding duodenal ulcer.  He was transfused 1 more unit of PRBC on 02/06/2022.  His care was transferred to [facility] from 02/06/2022 onwards.  Assessment & Plan:   Upper GI bleeding/nonbleeding duodenal ulcer Acute blood loss anemia -Status post 3 units PRBC and 1 unit FFP transfusion during the hospitalization.  Hemoglobin was 8.1 earlier this morning for which she received his third unit of PRBC.  Hemoglobin 9.7 this morning.  Monitor H&H -Currently on Protonix IV every 12 hours.  GI following.  Had EGD on 02/05/2022 which showed nonbleeding duodenal ulcer with clean ulcer base.  GI recommends to continue PPI daily indefinitely and to hold anticoagulation for another 2 to 4 weeks if possible.  Leukocytosis Possibly reactive.  Improving.  Monitor  Thrombocytopenia -Monitor.    Hypernatremia Possibly from poor oral intake.  Continue current IV fluids with dextrose.  Repeat Pete a.m. labs.  Acute kidney injury -Baseline creatinine of 0.9-1.01.  Presented with creatinine of 1.98.  Improving to 1.43 today.  Repeat a.m. labs. -Renal ultrasound showed mild left-sided hydronephrosis with no visible stones, layering debris in  bladder which could be infectious versus hemorrhagic.  Might need outpatient follow-up with urology  Lactic acidosis -Treated with IV fluids.  Improving.  Hyperkalemia -Resolved  History of pulmonary emboli -History of subsegmental PE in 10/2021. -No need for further anticoagulation for this PE as per pulmonary.  Diabetes mellitus type 2 with hypoglycemia -Continue CBGs with SSI  Chronic diastolic heart failure -Strict input and output.  Daily weights.  Currently compensated.  Hypertension -Blood pressure currently stable.  Antihypertensives on hold.  Acute metabolic encephalopathy/delirium Possible undiagnosed underlying dementia Goals of care -Patient has had issues with delirium/sundowning and apparently happens in the SNF that he is currently made.  Monitor mental status.  PT eval.  Follow cultures. -Palliative care consultation for goals of care discussion.  Currently listed as full code. -Currently remains NPO.  SLP evaluation.  DVT prophylaxis: SCDs Code Status: Full Family Communication: None at bedside Disposition Plan: Status is: Inpatient Remains inpatient appropriate because: Of severity of illness  Consultants: GI/PCCM  Procedures: EGD  Antimicrobials: None   Subjective: Patient seen and examined at bedside.  Awake, poor historian.  No overnight fever, seizures or vomiting reported.  Nursing staff reported some dark stool overnight.  Objective: Vitals:   02/06/22 0700 02/06/22 0800 02/06/22 0842 02/06/22 0900  BP: 139/68   (!) 154/53  Pulse: 86 85 (!) 160 88  Resp: (!) 24     Temp:   (!) 97.2 F (36.2 C)   TempSrc:   Oral   SpO2: 96% 90% 92% 98%  Weight:  Height:        Intake/Output Summary (Last 24 hours) at 02/06/2022 1106 Last data filed at 02/06/2022 0600 Gross per 24 hour  Intake 1521.86 ml  Output 1150 ml  Net 371.86 ml   Filed Weights   02/05/22 0000 02/05/22 0500 02/06/22 0447  Weight: 67.6 kg 69.2 kg 67.1 kg     Examination:  General exam: Appears calm and comfortable.  Currently on 2 L oxygen via nasal cannula.  Elderly male lying in bed.   Respiratory system: Bilateral decreased breath sounds at bases with some scattered crackles Cardiovascular system: S1 & S2 heard, Rate controlled Gastrointestinal system: Abdomen is nondistended, soft and nontender. Normal bowel sounds heard. Extremities: No cyanosis, clubbing; trace lower extremity edema Central nervous system: Alert, respond, poor historian.  No focal neurological deficits. Moving extremities Skin: No rashes, lesions or ulcers Psychiatry: Mostly flat affect.  Currently not agitated.  Data Reviewed: I have personally reviewed following labs and imaging studies  CBC: Recent Labs  Lab 02/04/22 1815 02/05/22 0747 02/05/22 1616 02/06/22 0028 02/06/22 0950  WBC 31.3*  --  18.1* 14.0* 13.1*  NEUTROABS 28.1*  --   --   --   --   HGB 6.0* 8.2* 9.4* 8.1* 9.7*  HCT 19.1* 25.6* 29.1* 25.3* 30.2*  MCV 99.5  --  93.9 95.8 95.6  PLT 215  --  148* 142* 086*   Basic Metabolic Panel: Recent Labs  Lab 02/04/22 1815 02/05/22 0747 02/06/22 0028  NA 147* 149* 153*  K 6.0* 5.0 4.2  CL 110 113* 117*  CO2 '28 28 28  '$ GLUCOSE 162* 103* 111*  BUN 119* 110* 89*  CREATININE 1.98* 1.64* 1.43*  CALCIUM 9.4 8.6* 8.6*  MG  --  2.1  2.1  --   PHOS  --  5.9*  5.9*  --    GFR: Estimated Creatinine Clearance: 29.3 mL/min (A) (by C-G formula based on SCr of 1.43 mg/dL (H)). Liver Function Tests: Recent Labs  Lab 02/04/22 1815  AST 23  ALT 10  ALKPHOS 44  BILITOT 0.4  PROT 5.8*  ALBUMIN 3.0*   Recent Labs  Lab 02/05/22 0747  LIPASE 44   No results for input(s): "AMMONIA" in the last 168 hours. Coagulation Profile: Recent Labs  Lab 02/04/22 1815  INR 2.2*   Cardiac Enzymes: No results for input(s): "CKTOTAL", "CKMB", "CKMBINDEX", "TROPONINI" in the last 168 hours. BNP (last 3 results) No results for input(s): "PROBNP" in the last  8760 hours. HbA1C: No results for input(s): "HGBA1C" in the last 72 hours. CBG: Recent Labs  Lab 02/05/22 2034 02/05/22 2106 02/06/22 0014 02/06/22 0347 02/06/22 0803  GLUCAP 68* 97 89 96 78   Lipid Profile: No results for input(s): "CHOL", "HDL", "LDLCALC", "TRIG", "CHOLHDL", "LDLDIRECT" in the last 72 hours. Thyroid Function Tests: No results for input(s): "TSH", "T4TOTAL", "FREET4", "T3FREE", "THYROIDAB" in the last 72 hours. Anemia Panel: No results for input(s): "VITAMINB12", "FOLATE", "FERRITIN", "TIBC", "IRON", "RETICCTPCT" in the last 72 hours. Sepsis Labs: Recent Labs  Lab 02/04/22 1815 02/04/22 2035 02/05/22 1616  LATICACIDVEN 2.3* 2.7* 1.5    Recent Results (from the past 240 hour(s))  Culture, blood (Routine x 2)     Status: None (Preliminary result)   Collection Time: 02/04/22  6:15 PM   Specimen: BLOOD  Result Value Ref Range Status   Specimen Description   Final    BLOOD LEFT ANTECUBITAL Performed at [facility] [address], [place], [place] [address]  Special Requests   Final    BOTTLES DRAWN AEROBIC AND ANAEROBIC Blood Culture adequate volume Performed at [facility] [address], [place], [place] [address]    Culture   Final    NO GROWTH 2 DAYS Performed at [facility] [address], [place], [place] [address]    Report Status PENDING  Incomplete  Resp Panel by RT-PCR (Flu A&B, Covid) Anterior Nasal Swab     Status: None   Collection Time: 02/04/22  6:32 PM   Specimen: Anterior Nasal Swab  Result Value Ref Range Status   SARS Coronavirus 2 by RT PCR NEGATIVE NEGATIVE Final    Comment: (NOTE) SARS-CoV-2 target nucleic acids are NOT DETECTED.  The SARS-CoV-2 RNA is generally detectable in upper respiratory specimens during the acute phase of infection. The lowest concentration of SARS-CoV-2 viral copies this assay can detect is 138 copies/mL. A negative result does not preclude  SARS-Cov-2 infection and should not be used as the sole basis for treatment or other patient management decisions. A negative result may occur with  improper specimen collection/handling, submission of specimen other than nasopharyngeal swab, presence of viral mutation(s) within the areas targeted by this assay, and inadequate number of viral copies(<138 copies/mL). A negative result must be combined with clinical observations, patient history, and epidemiological information. The expected result is Negative.  Fact Sheet for Patients:  EntrepreneurPulse.com.au  Fact Sheet for Healthcare Providers:  IncredibleEmployment.be  This test is no t yet approved or cleared by the [place] FDA and  has been authorized for detection and/or diagnosis of SARS-CoV-2 by FDA under an Emergency Use Authorization (EUA). This EUA will remain  in effect (meaning this test can be used) for the duration of the COVID-19 declaration under Section 564(b)(1) of the Act, 21 U.S.C.section 360bbb-3(b)(1), unless the authorization is terminated  or revoked sooner.       Influenza A by PCR NEGATIVE NEGATIVE Final   Influenza B by PCR NEGATIVE NEGATIVE Final    Comment: (NOTE) The Xpert Xpress SARS-CoV-2/FLU/RSV plus assay is intended as an aid in the diagnosis of influenza from Nasopharyngeal swab specimens and should not be used as a sole basis for treatment. Nasal washings and aspirates are unacceptable for Xpert Xpress SARS-CoV-2/FLU/RSV testing.  Fact Sheet for Patients: EntrepreneurPulse.com.au  Fact Sheet for Healthcare Providers: IncredibleEmployment.be  This test is not yet approved or cleared by the [place] FDA and has been authorized for detection and/or diagnosis of SARS-CoV-2 by FDA under an Emergency Use Authorization (EUA). This EUA will remain in effect (meaning this test can be used) for the duration of  the COVID-19 declaration under Section 564(b)(1) of the Act, 21 U.S.C. section 360bbb-3(b)(1), unless the authorization is terminated or revoked.  Performed at [facility], [place] [address] [place], [place] [address]   Culture, blood (Routine x 2)     Status: None (Preliminary result)   Collection Time: 02/04/22  7:10 PM   Specimen: BLOOD  Result Value Ref Range Status   Specimen Description   Final    BLOOD SITE NOT SPECIFIED Performed at [facility] [address] [place], [place] [address]    Special Requests   Final    BOTTLES DRAWN AEROBIC AND ANAEROBIC Blood Culture results may not be optimal due to an inadequate volume of blood received in culture bottles Performed at [facility] [address], [place], [place] [address]    Culture  Final    NO GROWTH 2 DAYS Performed at [facility], [place] [address], [place], [place] [address]    Report Status PENDING  Incomplete  MRSA Next Gen by PCR, Nasal     Status: None   Collection Time: 02/04/22 10:23 PM   Specimen: Urine, Clean Catch; Nasal Swab  Result Value Ref Range Status   MRSA by PCR Next Gen NOT DETECTED NOT DETECTED Final    Comment: (NOTE) The GeneXpert MRSA Assay (FDA approved for NASAL specimens only), is one component of a comprehensive MRSA colonization surveillance program. It is not intended to diagnose MRSA infection nor to guide or monitor treatment for MRSA infections. Test performance is not FDA approved in patients less than 64 years old. Performed at [facility], [place] [address] [place], [place] [address]          Radiology Studies: US RENAL  Result Date: 02/05/2022 CLINICAL DATA:  [id number].  Acute kidney injury. EXAM: RENAL / URINARY TRACT ULTRASOUND COMPLETE COMPARISON:  None Available. FINDINGS: Technical note: Difficult exam due to body habitus, bowel gas, and level of patient responsiveness. Right Kidney:  Renal measurements: Small in length measuring 7.2 x 5.1 x 5.0 cm = volume: 97.6 mL. Echogenicity within normal limits but with slight general cortical thinning. No mass, stones or hydronephrosis visualized. Left Kidney: Renal measurements: 9.0 x 4.0 x 4.2 cm = volume: 79.9 mL. Echogenicity within normal limits. No mass or stones visualized. There is mild hydronephrosis. Bladder: Appears normal for degree of bladder distention, but does contain a small volume of dependent hypoechoic layering debris. Bladder volume measurements were not obtained. Ureteral jets were not seen during the study. Other: None. IMPRESSION: 1. Slightly small kidneys, minimal cortical thinning on the right but no increased cortical echogenicity either side. 2. Mild left hydronephrosis. No right hydronephrosis. No visible intrarenal stones. 3. No significant bladder thickening but there is layering debris in the bladder which could be hemorrhagic or infectious etiology. 4. Ureteral jets not seen during the exam. Electronically Signed   By: Telford Nab M.D.   On: 02/05/2022 00:39   DG Chest Port 1 View  Result Date: 02/04/2022 CLINICAL DATA:  Increased lethargy EXAM: PORTABLE CHEST 1 VIEW COMPARISON:  Previous studies including the examination of 11/15/2021 FINDINGS: There is poor inspiration. Cardiac size is within normal limits. Thoracic aorta is tortuous and ectatic. There is improvement in the aeration of both lungs. There are residual linear densities in both lower lung fields. Right hemidiaphragm is elevated. There is no significant pleural effusion or pneumothorax. Severe degenerative changes are noted in both shoulders. IMPRESSION: There is interval improvement in aeration of both lungs. Residual linear densities in both lower lung fields suggest scarring or subsegmental atelectasis. No new focal infiltrates are seen. There is no pleural effusion. Severe degenerative changes are noted in both shoulders. Electronically Signed   By:  Elmer Picker M.D.   On: 02/04/2022 19:00        Scheduled Meds:  Chlorhexidine Gluconate Cloth  6 each Topical Daily   insulin aspart  0-9 Units Subcutaneous Q4H   pantoprazole (PROTONIX) IV  40 mg Intravenous Q12H   Continuous Infusions:  sodium chloride 10 mL/hr at 02/06/22 0600   dextrose 5% lactated ringers 50 mL/hr at 02/06/22 0600   norepinephrine (LEVOPHED) Adult infusion            Aline August, MD [facility] 02/06/2022, 11:06 AM

## 2022-02-06 NOTE — Progress Notes (Signed)
eLink Physician-Brief Progress Note Patient Name: Robert Beltran DOB: Nov 07, 1923 MRN: [id number]   Date of Service  02/06/2022  HPI/Events of Note  Hemoglobin 8.1, BP 126/25, MAP 60 after albumin bolus.  eICU Interventions  Will transfuse 1 unit of PRBC.        [name] 02/06/2022, 1:33 AM

## 2022-02-06 NOTE — Progress Notes (Signed)
Notified by staff pt with extreme agitation. Known h/o dementia. Wish to avoid BZDs and also informed pt unable to swallow. Haldol 1 mg IV q 6hrs prn ordered. Qtc on EKG this admit 469 ms.. Will repeat EKG in am

## 2022-02-07 DIAGNOSIS — E119 Type 2 diabetes mellitus without complications: Secondary | ICD-10-CM

## 2022-02-07 DIAGNOSIS — E870 Hyperosmolality and hypernatremia: Secondary | ICD-10-CM

## 2022-02-07 DIAGNOSIS — K922 Gastrointestinal hemorrhage, unspecified: Secondary | ICD-10-CM | POA: Diagnosis not present

## 2022-02-07 DIAGNOSIS — I5032 Chronic diastolic (congestive) heart failure: Secondary | ICD-10-CM

## 2022-02-07 DIAGNOSIS — N179 Acute kidney failure, unspecified: Secondary | ICD-10-CM

## 2022-02-07 DIAGNOSIS — R410 Disorientation, unspecified: Secondary | ICD-10-CM

## 2022-02-07 DIAGNOSIS — Z86711 Personal history of pulmonary embolism: Secondary | ICD-10-CM

## 2022-02-07 LAB — GLUCOSE, CAPILLARY
Glucose-Capillary: 134 mg/dL — ABNORMAL HIGH (ref 70–99)
Glucose-Capillary: 132 mg/dL — ABNORMAL HIGH (ref 70–99)
Glucose-Capillary: 106 mg/dL — ABNORMAL HIGH (ref 70–99)
Glucose-Capillary: 120 mg/dL — ABNORMAL HIGH (ref 70–99)

## 2022-02-07 LAB — CBC WITH DIFFERENTIAL/PLATELET
Abs Immature Granulocytes: 0.14 10*3/uL — ABNORMAL HIGH (ref 0.00–0.07)
Basophils Absolute: 0 10*3/uL (ref 0.0–0.1)
Basophils Relative: 0 %
Eosinophils Absolute: 0.1 10*3/uL (ref 0.0–0.5)
Eosinophils Relative: 1 %
HCT: 30.5 % — ABNORMAL LOW (ref 39.0–52.0)
Hemoglobin: 9.6 g/dL — ABNORMAL LOW (ref 13.0–17.0)
Immature Granulocytes: 2 %
Lymphocytes Relative: 7 %
Lymphs Abs: 0.6 10*3/uL — ABNORMAL LOW (ref 0.7–4.0)
MCH: 30.4 pg (ref 26.0–34.0)
MCHC: 31.5 g/dL (ref 30.0–36.0)
MCV: 96.5 fL (ref 80.0–100.0)
Monocytes Absolute: 0.7 10*3/uL (ref 0.1–1.0)
Monocytes Relative: 8 %
Neutro Abs: 7.2 10*3/uL (ref 1.7–7.7)
Neutrophils Relative %: 82 %
Platelets: 147 10*3/uL — ABNORMAL LOW (ref 150–400)
RBC: 3.16 MIL/uL — ABNORMAL LOW (ref 4.22–5.81)
RDW: 16.1 % — ABNORMAL HIGH (ref 11.5–15.5)
WBC: 8.8 10*3/uL (ref 4.0–10.5)
nRBC: 0 % (ref 0.0–0.2)

## 2022-02-07 LAB — BASIC METABOLIC PANEL
Anion gap: 4 — ABNORMAL LOW (ref 5–15)
BUN: 55 mg/dL — ABNORMAL HIGH (ref 8–23)
CO2: 29 mmol/L (ref 22–32)
Calcium: 8.8 mg/dL — ABNORMAL LOW (ref 8.9–10.3)
Chloride: 122 mmol/L — ABNORMAL HIGH (ref 98–111)
Creatinine, Ser: 1.24 mg/dL (ref 0.61–1.24)
GFR, Estimated: 54 mL/min — ABNORMAL LOW (ref >60)
Glucose, Bld: 148 mg/dL — ABNORMAL HIGH (ref 70–99)
Potassium: 3.4 mmol/L — ABNORMAL LOW (ref 3.5–5.1)
Sodium: 155 mmol/L — ABNORMAL HIGH (ref 135–145)

## 2022-02-07 LAB — MAGNESIUM: Magnesium: 2.1 mg/dL (ref 1.7–2.4)

## 2022-02-07 MED ORDER — ORAL CARE MOUTH RINSE
15.0000 mL | OROMUCOSAL | Status: DC
  Administered 2022-02-07 – 2022-02-18 (×34): 15 mL via OROMUCOSAL

## 2022-02-07 MED ORDER — MIRTAZAPINE 15 MG PO TABS
7.5000 mg | ORAL_TABLET | Freq: Every day | ORAL | Status: DC
  Administered 2022-02-08: 7.5 mg via ORAL
  Filled 2022-02-07: qty 1

## 2022-02-07 MED ORDER — ORAL CARE MOUTH RINSE: 15.0000 mL | OROMUCOSAL | Status: DC | PRN

## 2022-02-07 MED ORDER — DEXTROSE 5 % IV SOLN
INTRAVENOUS | Status: DC
Start: 2022-02-07 — End: 2022-02-12

## 2022-02-07 NOTE — Progress Notes (Signed)
PROGRESS NOTE    Robert Beltran  PTW:[id number] DOB: 23-Sep-1923 DOA: 02/04/2022 PCP: Garwin Brothers, MD   Brief Narrative:  88 year old male with history of BPH, diabetes mellitus type 2, hypertension, recent admission in 10/2021 for pneumonia and pulmonary embolism for which he was started on oral Eliquis presented with decreased activity/oral intake and dark bowel movements.  On presentation, hemoglobin was 6 with WBC of 31.3, creatinine of 1.98 (baseline of 0.9-1.01), potassium of 6, sodium 147, lactic acid of 2.3 then 2.7.  He was noted to have dark melanotic stool.  He was admitted to ICU under PCCM service.  He was started on IV PPI.  He was given Kcentra in the ER along with 2 units of PRBC and 1 unit of FFP.  GI was consulted.  He underwent EGD on 02/05/2022 which showed nonbleeding duodenal ulcer.  He was transfused 1 more unit of PRBC on 02/06/2022.  His care was transferred to [facility] from 02/06/2022 onwards.  Palliative care was consulted for goals of care discussion.  Assessment & Plan:   Upper GI bleeding/nonbleeding duodenal ulcer Acute blood loss anemia -Status post 3 units PRBC and 1 unit FFP transfusion during the hospitalization.  Hemoglobin was 8.1 earlier this morning for which she received his third unit of PRBC.  Hemoglobin 9.6 this morning.  Monitor H&H -Currently on Protonix IV every 12 hours.  GI following.  Had EGD on 02/05/2022 which showed nonbleeding duodenal ulcer with clean ulcer base.  GI recommends to continue PPI daily indefinitely and to hold anticoagulation for another 2 to 4 weeks if possible.  Leukocytosis -Resolved  Thrombocytopenia -Monitor.    Hypernatremia Possibly from poor oral intake.  Continue current IV fluids with dextrose.  Repeat  a.m. labs.  Acute kidney injury -Baseline creatinine of 0.9-1.01.  Presented with creatinine of 1.98.  Improving to 1.24 today.  Repeat a.m. labs. -Renal ultrasound showed mild left-sided hydronephrosis with no visible  stones, layering debris in bladder which could be infectious versus hemorrhagic.  Might need outpatient follow-up with urology  Lactic acidosis -Treated with IV fluids.  Improving.  Hyperkalemia -Resolved  Hypokalemia -Mild.  Repeat a.m. labs  History of pulmonary emboli -History of subsegmental PE in 10/2021. -No need for further anticoagulation for this PE as per pulmonary.  Diabetes mellitus type 2 with hypoglycemia -Continue CBGs with SSI  Chronic diastolic heart failure -Strict input and output.  Daily weights.  Currently compensated.  Hypertension -Blood pressure currently stable.  Antihypertensives on hold.  Acute metabolic encephalopathy/delirium Possible undiagnosed underlying dementia Goals of care -Patient has had issues with delirium/sundowning and apparently happens in the SNF that he is currently made.  Monitor mental status.  PT eval.  Follow cultures. -Palliative care following for goals of care.  Currently remains full code. -Currently remains NPO.  SLP following -Patient was extremely agitated overnight requiring IV Haldol  DVT prophylaxis: SCDs Code Status: Full Family Communication: None at bedside Disposition Plan: Status is: Inpatient Remains inpatient appropriate because: Of severity of illness  Consultants: GI/PCCM/palliative care  Procedures: EGD  Antimicrobials: None   Subjective: Patient seen and examined at bedside.  Awake, poor historian.  Patient was agitated overnight requiring IV Haldol as per nursing staff.  No fever, seizures, vomiting reported. Objective: Vitals:   02/06/22 2300 02/06/22 2330 02/07/22 0400 02/07/22 0500  BP:  (!) 155/24 (!) 143/80   Pulse: (!) 51  87   Resp: (!) 32 (!) 27 15   Temp: 98.6 F (37 C)  98.7 F (37.1 C)   TempSrc: Oral  Oral   SpO2: 99%  100%   Weight:    67.4 kg  Height:        Intake/Output Summary (Last 24 hours) at 02/07/2022 0736 Last data filed at 02/07/2022 0700 Gross per 24 hour   Intake 614.25 ml  Output 400 ml  Net 214.25 ml    Filed Weights   02/05/22 0500 02/06/22 0447 02/07/22 0500  Weight: 69.2 kg 67.1 kg 67.4 kg    Examination:  General: On room air.  No distress.  Elderly male lying in bed.   ENT/neck: No thyromegaly.  JVD is not elevated  respiratory: Decreased breath sounds at bases bilaterally with some crackles; no wheezing CVS: S1-S2 heard, rate controlled Abdominal: Soft, nontender, slightly distended; no organomegaly, bowel sounds are heard Extremities: Trace lower extremity edema; no cyanosis  CNS: Awake, slow to respond.  Confused.  Poor historian.  No focal neurologic deficit.  Moves extremities Lymph: No obvious lymphadenopathy Skin: No obvious ecchymosis/lesions  psych: Flat affect.  No signs of agitation currently.   Musculoskeletal: No obvious joint swelling/deformity   Data Reviewed: I have personally reviewed following labs and imaging studies  CBC: Recent Labs  Lab 02/04/22 1815 02/05/22 0747 02/05/22 1616 02/06/22 0028 02/06/22 0950 02/07/22 0327  WBC 31.3*  --  18.1* 14.0* 13.1* 8.8  NEUTROABS 28.1*  --   --   --   --  7.2  HGB 6.0* 8.2* 9.4* 8.1* 9.7* 9.6*  HCT 19.1* 25.6* 29.1* 25.3* 30.2* 30.5*  MCV 99.5  --  93.9 95.8 95.6 96.5  PLT 215  --  148* 142* 142* 147*    Basic Metabolic Panel: Recent Labs  Lab 02/04/22 1815 02/05/22 0747 02/06/22 0028 02/07/22 0327  NA 147* 149* 153* 155*  K 6.0* 5.0 4.2 3.4*  CL 110 113* 117* 122*  CO2 '28 28 28 29  '$ GLUCOSE 162* 103* 111* 148*  BUN 119* 110* 89* 55*  CREATININE 1.98* 1.64* 1.43* 1.24  CALCIUM 9.4 8.6* 8.6* 8.8*  MG  --  2.1  2.1  --  2.1  PHOS  --  5.9*  5.9*  --   --     GFR: Estimated Creatinine Clearance: 34 mL/min (by C-G formula based on SCr of 1.24 mg/dL). Liver Function Tests: Recent Labs  Lab 02/04/22 1815  AST 23  ALT 10  ALKPHOS 44  BILITOT 0.4  PROT 5.8*  ALBUMIN 3.0*    Recent Labs  Lab 02/05/22 0747  LIPASE 44    No  results for input(s): "AMMONIA" in the last 168 hours. Coagulation Profile: Recent Labs  Lab 02/04/22 1815  INR 2.2*    Cardiac Enzymes: No results for input(s): "CKTOTAL", "CKMB", "CKMBINDEX", "TROPONINI" in the last 168 hours. BNP (last 3 results) No results for input(s): "PROBNP" in the last 8760 hours. HbA1C: No results for input(s): "HGBA1C" in the last 72 hours. CBG: Recent Labs  Lab 02/06/22 0803 02/06/22 1202 02/06/22 1254 02/06/22 1657 02/06/22 1934  GLUCAP 78 48* 94 105* 106*    Lipid Profile: No results for input(s): "CHOL", "HDL", "LDLCALC", "TRIG", "CHOLHDL", "LDLDIRECT" in the last 72 hours. Thyroid Function Tests: No results for input(s): "TSH", "T4TOTAL", "FREET4", "T3FREE", "THYROIDAB" in the last 72 hours. Anemia Panel: No results for input(s): "VITAMINB12", "FOLATE", "FERRITIN", "TIBC", "IRON", "RETICCTPCT" in the last 72 hours. Sepsis Labs: Recent Labs  Lab 02/04/22 1815 02/04/22 2035 02/05/22 1616  LATICACIDVEN 2.3* 2.7* 1.5  Recent Results (from the past 240 hour(s))  Culture, blood (Routine x 2)     Status: None (Preliminary result)   Collection Time: 02/04/22  6:15 PM   Specimen: BLOOD  Result Value Ref Range Status   Specimen Description   Final    BLOOD LEFT ANTECUBITAL Performed at [facility] [address], [place], [place] [address]    Special Requests   Final    BOTTLES DRAWN AEROBIC AND ANAEROBIC Blood Culture adequate volume Performed at [facility] [address], [place], [place] [address]    Culture   Final    NO GROWTH 2 DAYS Performed at [facility] [address] [place], [place] [address]    Report Status PENDING  Incomplete  Resp Panel by RT-PCR (Flu A&B, Covid) Anterior Nasal Swab     Status: None   Collection Time: 02/04/22  6:32 PM   Specimen: Anterior Nasal Swab  Result Value Ref Range Status   SARS Coronavirus 2 by RT PCR NEGATIVE NEGATIVE Final     Comment: (NOTE) SARS-CoV-2 target nucleic acids are NOT DETECTED.  The SARS-CoV-2 RNA is generally detectable in upper respiratory specimens during the acute phase of infection. The lowest concentration of SARS-CoV-2 viral copies this assay can detect is 138 copies/mL. A negative result does not preclude SARS-Cov-2 infection and should not be used as the sole basis for treatment or other patient management decisions. A negative result may occur with  improper specimen collection/handling, submission of specimen other than nasopharyngeal swab, presence of viral mutation(s) within the areas targeted by this assay, and inadequate number of viral copies(<138 copies/mL). A negative result must be combined with clinical observations, patient history, and epidemiological information. The expected result is Negative.  Fact Sheet for Patients:  EntrepreneurPulse.com.au  Fact Sheet for Healthcare Providers:  IncredibleEmployment.be  This test is no t yet approved or cleared by the [place] FDA and  has been authorized for detection and/or diagnosis of SARS-CoV-2 by FDA under an Emergency Use Authorization (EUA). This EUA will remain  in effect (meaning this test can be used) for the duration of the COVID-19 declaration under Section 564(b)(1) of the Act, 21 U.S.C.section 360bbb-3(b)(1), unless the authorization is terminated  or revoked sooner.       Influenza A by PCR NEGATIVE NEGATIVE Final   Influenza B by PCR NEGATIVE NEGATIVE Final    Comment: (NOTE) The Xpert Xpress SARS-CoV-2/FLU/RSV plus assay is intended as an aid in the diagnosis of influenza from Nasopharyngeal swab specimens and should not be used as a sole basis for treatment. Nasal washings and aspirates are unacceptable for Xpert Xpress SARS-CoV-2/FLU/RSV testing.  Fact Sheet for Patients: EntrepreneurPulse.com.au  Fact Sheet for Healthcare  Providers: IncredibleEmployment.be  This test is not yet approved or cleared by the [place] FDA and has been authorized for detection and/or diagnosis of SARS-CoV-2 by FDA under an Emergency Use Authorization (EUA). This EUA will remain in effect (meaning this test can be used) for the duration of the COVID-19 declaration under Section 564(b)(1) of the Act, 21 U.S.C. section 360bbb-3(b)(1), unless the authorization is terminated or revoked.  Performed at [facility], [address] [place], [place] [address]   Culture, blood (Routine x 2)     Status: None (Preliminary result)   Collection Time: 02/04/22  7:10 PM   Specimen: BLOOD  Result Value Ref Range Status   Specimen Description   Final    BLOOD SITE  NOT SPECIFIED Performed at [facility] [address] [place], [place] [address]    Special Requests   Final    BOTTLES DRAWN AEROBIC AND ANAEROBIC Blood Culture results may not be optimal due to an inadequate volume of blood received in culture bottles Performed at [facility] [address] [place], [place] [address]    Culture   Final    NO GROWTH 2 DAYS Performed at [facility] [address], [place], [place] [address]    Report Status PENDING  Incomplete  MRSA Next Gen by PCR, Nasal     Status: None   Collection Time: 02/04/22 10:23 PM   Specimen: Urine, Clean Catch; Nasal Swab  Result Value Ref Range Status   MRSA by PCR Next Gen NOT DETECTED NOT DETECTED Final    Comment: (NOTE) The GeneXpert MRSA Assay (FDA approved for NASAL specimens only), is one component of a comprehensive MRSA colonization surveillance program. It is not intended to diagnose MRSA infection nor to guide or monitor treatment for MRSA infections. Test performance is not FDA approved in patients less than 14 years old. Performed at [facility], [place] [address], [place],   [address]          Radiology Studies: No results found.      Scheduled Meds:  Chlorhexidine Gluconate Cloth  6 each Topical Daily   insulin aspart  0-9 Units Subcutaneous Q4H   pantoprazole (PROTONIX) IV  40 mg Intravenous Q12H   Continuous Infusions:  sodium chloride Stopped (02/06/22 1643)   norepinephrine (LEVOPHED) Adult infusion            Aline August, MD [facility] 02/07/2022, 7:36 AM

## 2022-02-07 NOTE — Progress Notes (Signed)
PT Cancellation Note  Patient Details Name: Robert Beltran MRN: [id number] DOB: 03-03-1924   Cancelled Treatment:     PT order received but eval deferred at request of pt's son.  Son states pt has been difficult to rouse and would not be able to participate this date.  Will follow.   Chantel Teti 02/07/2022, 3:26 PM

## 2022-02-07 NOTE — Progress Notes (Signed)
Speech Language Pathology Treatment: Dysphagia  Patient Details Name: OSBORN PULLIN MRN: [id number] DOB: [birth year] Today's Date: 02/07/2022 Time: 3716-9678 SLP Time Calculation (min) (ACUTE ONLY): 25 min  Assessment / Plan / Recommendation Clinical Impression  Patient seen by SLP for skilled treatment focused on dysphagia goals. Patient's son was in the room and SLP provided education and discussed plan of care regarding swallow function. Patient was alert but lethargic and kept eyes mostly closed. Per son, he typically is more alert. After getting patient repositioned in bed and completing oral care, SLP assessed his toleration of thin liquids and nectar thick liquids at bedside. All liquids given via spoon sips. With thin liquids (water), patient exhibited consistent and immediate and delayed throat clearing followed by wet sounding voice but with nectar thick liquids, patient exhibited only one instance of throat clearing but no change in vocal quality. He does not appear ready for MBS at this time as he is not able to maintain adequate alertness and with his known h/o dysphagia, he would likely not do well. SLP is recommending hold off on repeat MBS until at least tomorrow but in the meantime, allow patient to have spoon sips nectar thick liquids PRN after oral care.  SLP will f/u next date to determine readiness for repeat MBS.   HPI HPI: 88 yo male adm to [facility] with GI bleed.  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  Swallow eval ordered.  Most recent MBS 10/2021 showed worsening pharyngeal swallow marked by delay, reduced tongue base retraction, pharyngeal constriction and anterior laryngeal movement - resulting in penetration and aspirtion *silent and audible*.  Throat clearing and delayed cough inconsistent with aspiration and cued cough ineffective to  expel aspirate.  Chin tuck not helpful.  Small single boluses prevented aspiration and dry swallow swallow assisted to decrease retention.  Per son, pt was on a regular/thin diet PTA.      SLP Plan  Continue with current plan of care      Recommendations for follow up therapy are one component of a multi-disciplinary discharge planning process, led by the attending physician.  Recommendations may be updated based on patient status, additional functional criteria and insurance authorization.    Recommendations  Diet recommendations: NPO;Other(comment) (PRN spoon sips nectar liquids when alert) Medication Administration: Via alternative means                Oral Care Recommendations: Oral care QID;Oral care BID;Staff/trained caregiver to provide oral care Follow Up Recommendations: Follow physician's recommendations for discharge plan and follow up therapies SLP Visit Diagnosis: Dysphagia, pharyngeal phase (R13.13) Plan: Continue with current plan of care         Sonia Baller, MA, CCC-SLP Speech Therapy

## 2022-02-08 ENCOUNTER — Inpatient Hospital Stay (HOSPITAL_COMMUNITY): Payer: Medicare Other

## 2022-02-08 ENCOUNTER — Other Ambulatory Visit: Payer: Self-pay

## 2022-02-08 DIAGNOSIS — Z86711 Personal history of pulmonary embolism: Secondary | ICD-10-CM | POA: Diagnosis not present

## 2022-02-08 DIAGNOSIS — K922 Gastrointestinal hemorrhage, unspecified: Secondary | ICD-10-CM | POA: Diagnosis not present

## 2022-02-08 DIAGNOSIS — R410 Disorientation, unspecified: Secondary | ICD-10-CM

## 2022-02-08 DIAGNOSIS — N179 Acute kidney failure, unspecified: Secondary | ICD-10-CM | POA: Diagnosis not present

## 2022-02-08 LAB — BPAM RBC
Blood Product Expiration Date: 202308292359
Blood Product Expiration Date: 202309012359
Blood Product Expiration Date: 202309072359
ISSUE DATE / TIME: 202308110019
ISSUE DATE / TIME: 202308110244
ISSUE DATE / TIME: 202308120201
Unit Type and Rh: 600
Unit Type and Rh: 600
Unit Type and Rh: 600

## 2022-02-08 LAB — TYPE AND SCREEN
ABO/RH(D): A NEG
Antibody Screen: NEGATIVE
Unit division: 0
Unit division: 0
Unit division: 0

## 2022-02-08 LAB — CBC WITH DIFFERENTIAL/PLATELET
Abs Immature Granulocytes: 0.26 10*3/uL — ABNORMAL HIGH (ref 0.00–0.07)
Basophils Absolute: 0.1 10*3/uL (ref 0.0–0.1)
Basophils Relative: 1 %
Eosinophils Absolute: 0.2 10*3/uL (ref 0.0–0.5)
Eosinophils Relative: 2 %
HCT: 31.6 % — ABNORMAL LOW (ref 39.0–52.0)
Hemoglobin: 9.7 g/dL — ABNORMAL LOW (ref 13.0–17.0)
Immature Granulocytes: 3 %
Lymphocytes Relative: 10 %
Lymphs Abs: 0.9 10*3/uL (ref 0.7–4.0)
MCH: 29.9 pg (ref 26.0–34.0)
MCHC: 30.7 g/dL (ref 30.0–36.0)
MCV: 97.5 fL (ref 80.0–100.0)
Monocytes Absolute: 0.6 10*3/uL (ref 0.1–1.0)
Monocytes Relative: 7 %
Neutro Abs: 7 10*3/uL (ref 1.7–7.7)
Neutrophils Relative %: 77 %
Platelets: 156 10*3/uL (ref 150–400)
RBC: 3.24 MIL/uL — ABNORMAL LOW (ref 4.22–5.81)
RDW: 15.8 % — ABNORMAL HIGH (ref 11.5–15.5)
WBC: 9 10*3/uL (ref 4.0–10.5)
nRBC: 0 % (ref 0.0–0.2)

## 2022-02-08 LAB — OCCULT BLOOD X 1 CARD TO LAB, STOOL: Fecal Occult Bld: POSITIVE — AB

## 2022-02-08 LAB — COMPREHENSIVE METABOLIC PANEL
ALT: 11 U/L (ref 0–44)
AST: 20 U/L (ref 15–41)
Albumin: 2.8 g/dL — ABNORMAL LOW (ref 3.5–5.0)
Alkaline Phosphatase: 46 U/L (ref 38–126)
Anion gap: 6 (ref 5–15)
BUN: 32 mg/dL — ABNORMAL HIGH (ref 8–23)
CO2: 29 mmol/L (ref 22–32)
Calcium: 8.4 mg/dL — ABNORMAL LOW (ref 8.9–10.3)
Chloride: 114 mmol/L — ABNORMAL HIGH (ref 98–111)
Creatinine, Ser: 0.9 mg/dL (ref 0.61–1.24)
GFR, Estimated: >60 mL/min (ref >60)
Glucose, Bld: 115 mg/dL — ABNORMAL HIGH (ref 70–99)
Potassium: 3.1 mmol/L — ABNORMAL LOW (ref 3.5–5.1)
Sodium: 149 mmol/L — ABNORMAL HIGH (ref 135–145)
Total Bilirubin: 0.5 mg/dL (ref 0.3–1.2)
Total Protein: 5.3 g/dL — ABNORMAL LOW (ref 6.5–8.1)

## 2022-02-08 LAB — GLUCOSE, CAPILLARY
Glucose-Capillary: 105 mg/dL — ABNORMAL HIGH (ref 70–99)
Glucose-Capillary: 118 mg/dL — ABNORMAL HIGH (ref 70–99)
Glucose-Capillary: 122 mg/dL — ABNORMAL HIGH (ref 70–99)
Glucose-Capillary: 123 mg/dL — ABNORMAL HIGH (ref 70–99)
Glucose-Capillary: 127 mg/dL — ABNORMAL HIGH (ref 70–99)
Glucose-Capillary: 132 mg/dL — ABNORMAL HIGH (ref 70–99)
Glucose-Capillary: 173 mg/dL — ABNORMAL HIGH (ref 70–99)

## 2022-02-08 LAB — TSH: TSH: 3.266 u[IU]/mL (ref 0.350–4.500)

## 2022-02-08 LAB — MAGNESIUM: Magnesium: 1.7 mg/dL (ref 1.7–2.4)

## 2022-02-08 LAB — AMMONIA: Ammonia: <10 umol/L (ref 9–35)

## 2022-02-08 LAB — VITAMIN B12: Vitamin B-12: 885 pg/mL (ref 180–914)

## 2022-02-08 MED ORDER — POTASSIUM CHLORIDE 10 MEQ/100ML IV SOLN
10.0000 meq | INTRAVENOUS | Status: AC
  Administered 2022-02-08 (×4): 10 meq via INTRAVENOUS
  Filled 2022-02-08 (×4): qty 100

## 2022-02-08 MED ORDER — PANTOPRAZOLE SODIUM 40 MG IV SOLR
40.0000 mg | INTRAVENOUS | Status: DC
  Administered 2022-02-08 – 2022-02-15 (×8): 40 mg via INTRAVENOUS
  Filled 2022-02-08 (×8): qty 10

## 2022-02-08 NOTE — Evaluation (Signed)
Physical Therapy Evaluation Patient Details Name: Robert Beltran MRN: [id number] DOB: August 25, 1923 Today's Date: 02/08/2022  History of Present Illness  88 year old male with history of BPH, diabetes mellitus type 2, hypertension, recent admission in 10/2021 for pneumonia and pulmonary embolism for which he was started on oral Eliquis presented 02/04/22 with decreased activity/oral intake and dark bowel movements.  On presentation, hemoglobin was 6. He underwent EGD on 02/05/2022 which showed nonbleeding duodenal ulcer. Patient  from [facility]  SNF.  Clinical Impression  The patient  admitted for above medical issues, patient comes from SNF/rehab. Patient currently requires total assistance of 2 for  ADL's and mobility. Attempted standing x 3 wit AD and 2  total  assistance, unable to stand to transfer  OOB. Pt admitted with above diagnosis.   Pt currently with functional limitations due to the deficits listed below (see PT Problem List). Pt will benefit from skilled PT to increase their independence and safety with mobility to allow discharge to the venue listed below.   '       Recommendations for follow up therapy are one component of a multi-disciplinary discharge planning process, led by the attending physician.  Recommendations may be updated based on patient status, additional functional criteria and insurance authorization.  Follow Up Recommendations Skilled nursing-short term rehab (<3 hours/day) Can patient physically be transported by private vehicle: No    Assistance Recommended at Discharge Frequent or constant Supervision/Assistance  Patient can return home with the following  Two people to help with walking and/or transfers;A lot of help with bathing/dressing/bathroom;Help with stairs or ramp for entrance;Assist for transportation    Equipment Recommendations None recommended by PT  Recommendations for Other Services       Functional Status Assessment Patient has had a recent  decline in their functional status and/or demonstrates limited ability to make significant improvements in function in a reasonable and predictable amount of time     Precautions / Restrictions Precautions Precautions: Fall      Mobility  Bed Mobility Overal bed mobility: Needs Assistance Bed Mobility: Rolling, Supine to Sit, Sit to Supine Rolling: Max assist   Supine to sit: Max assist, HOB elevated Sit to supine: Total assist, +2 for physical assistance   General bed mobility comments: patient able to assist with reaching for rail once turning,  max assist for legs and trunk to sit upright. assist with both legs  and trunk to return to supine    Transfers Overall transfer level: Needs assistance Equipment used: Rolling walker (2 wheels) Transfers: Sit to/from Stand Sit to Stand: From elevated surface, +2 safety/equipment, +2 physical assistance, Total assist           General transfer comment: attempted at Rw then STEDY x 2 , patient unable to clear buttocks.    Ambulation/Gait                  Stairs            Wheelchair Mobility    Modified Rankin (Stroke Patients Only)       Balance Overall balance assessment: Needs assistance Sitting-balance support: Feet supported, Bilateral upper extremity supported Sitting balance-Leahy Scale: Fair Sitting balance - Comments: able to tolerate mouth care by RN while seated on bed edge.                                     Pertinent Vitals/Pain Pain Assessment  Pain Assessment: No/denies pain    Home Living Family/patient expects to be discharged to:: Skilled nursing facility                        Prior Function               Mobility Comments: Patient unable to report  if he is in therapy or ambulates       Hand Dominance   Dominant Hand: Right    Extremity/Trunk Assessment   Upper Extremity Assessment Upper Extremity Assessment:  (bilateral shoulder flexion  limitations)    Lower Extremity Assessment Lower Extremity Assessment: Generalized weakness (does not fully bear weight to stnad)    Cervical / Trunk Assessment Cervical / Trunk Assessment: Kyphotic  Communication   Communication: HOH  Cognition Arousal/Alertness: Awake/alert Behavior During Therapy: WFL for tasks assessed/performed Overall Cognitive Status: History of cognitive impairments - at baseline                                 General Comments: follows simple directions        General Comments      Exercises     Assessment/Plan    PT Assessment Patient needs continued PT services  PT Problem List Decreased mobility;Decreased range of motion;Decreased cognition;Decreased activity tolerance;Decreased balance       PT Treatment Interventions DME instruction;Therapeutic activities;Cognitive remediation;Gait training;Therapeutic exercise;Patient/family education;Functional mobility training;Balance training    PT Goals (Current goals can be found in the Care Plan section)  Acute Rehab PT Goals Patient Stated Goal: agreed to OOB trial PT Goal Formulation: With patient/family Time For Goal Achievement: 02/22/22 Potential to Achieve Goals: Fair    Frequency Min 2X/week     Co-evaluation               AM-PAC PT "6 Clicks" Mobility  Outcome Measure Help needed turning from your back to your side while in a flat bed without using bedrails?: Total Help needed moving from lying on your back to sitting on the side of a flat bed without using bedrails?: Total Help needed moving to and from a bed to a chair (including a wheelchair)?: Total Help needed standing up from a chair using your arms (e.g., wheelchair or bedside chair)?: Total Help needed to walk in hospital room?: Total Help needed climbing 3-5 steps with a railing? : Total 6 Click Score: 6    End of Session Equipment Utilized During Treatment: Gait belt Activity Tolerance: Patient  tolerated treatment well Patient left: in bed;with call bell/phone within reach;with bed alarm set Nurse Communication: Mobility status;Need for lift equipment PT Visit Diagnosis: Muscle weakness (generalized) (M62.81);Difficulty in walking, not elsewhere classified (R26.2)    Time: 8250-0370 PT Time Calculation (min) (ACUTE ONLY): 33 min   Charges:   PT Evaluation $PT Eval Low Complexity: 1 Low PT Treatments $Therapeutic Activity: 8-22 mins       Tresa Endo PT Acute Rehabilitation Services Office [phone] Weekend [phone]   Claretha Cooper 02/08/2022, 10:29 AM

## 2022-02-08 NOTE — Progress Notes (Signed)
Modified Barium Swallow Progress Note  Patient Details  Name: Robert Beltran MRN: [id number] Date of Birth: [birth year]  Today's Date: 02/08/2022  Modified Barium Swallow completed.  Full report located under Chart Review in the Imaging Section.  Brief recommendations include the following:  Clinical Impression  Patient's swallow function does not appear to have changed significantly since previous one complete 3 months ago. He exhibited oral delays with all consistencies but more pronounced with puree solids as he would appear to be chewing puree and had decreased bolus cohesion, leading to anterior to posterior transit delays. During pharyngeal phase of swallow, he exhibited swallow initiation delays at level of vallecular sinus with puree solids, nectar thick liquids and small cup sips of thin liquids but with larger cup sips of thin liquids, patient exhibited swallow initiation delay at level of pyriform sinus. Only a trace amount of PO residuals observed in vallecular sinus with thin and nectar thick liquids and with puree solids and all residuals cleared pharynx with subsequent swallows. Patient did exhibit any instance of flash penetration (PAS 2) with nectar thick liquids when drinking with a straw but no penetration or aspiration of nectar thick liquids via cup sips. With thin liquids, patient exhibited penetration that did not clear (PAS 3) with larger sip but only flash penetration (PAS 2) with small controlled cup sips. 13 mm barium tablet taken with puree solids transited through UES without significant difficulty and esophageal sweep did not reveal any significant dysmotility or retrograde movement of barium. SLP is recommending to initiate a conservative diet of Dys 1 (puree) solids and nectar thick liquids with the option to have thin liquids in between meals. SLP contacted patient's son via phone call to discuss results and plan. SLP will continue to follow for diet toleration and ability  to advance with solids and liquids. Also recommending patient resume SLP therapy when back at SNF.   Swallow Evaluation Recommendations       SLP Diet Recommendations: Dysphagia 1 (Puree) solids;Nectar thick liquid   Liquid Administration via: Cup;No straw   Medication Administration: Whole meds with puree   Supervision: Patient able to self feed;Full supervision/cueing for compensatory strategies;Staff to assist with self feeding   Compensations: Small sips/bites;Minimize environmental distractions;Slow rate       Oral Care Recommendations: Oral care BID;Staff/trained caregiver to provide oral care   Other Recommendations: Order thickener from pharmacy;Prohibited food (jello, ice cream, thin soups);Have oral suction available;Clarify dietary restrictions    Sonia Baller, MA, CCC-SLP Speech Therapy

## 2022-02-08 NOTE — Care Management Important Message (Signed)
Important Message  Patient Details IM Letter placed in Patients room. Name: Robert Beltran MRN: [id number] Date of Birth: March 28, 1924   Medicare Important Message Given:  Yes     Kerin Salen 02/08/2022, 10:59 AM

## 2022-02-08 NOTE — Consult Note (Signed)
  88 year old male with history of BPH, diabetes mellitus type 2, hypertension, recent admission in 10/2021 for pneumonia and pulmonary embolism for which he was started on oral Eliquis presented 02/04/22 with decreased activity/oral intake and dark bowel movements.  On presentation, hemoglobin was 6. He underwent EGD on 02/05/2022 which showed nonbleeding duodenal ulcer. Patient  from [facility]  SNF.  Psych consult placed for extreme agitation.   Patient is seen and attempted to assess.  However patient is unable to provide history and or answer questions with formality.  Patient presents with confusion, likely at baseline.  He is alert and oriented x 1, self only.  He believes we are at [employer], unable to provide any additional orientation questions.  Patient is noted to be sitting upright in bed, pleasantly confused, and will follow also commands.  He does not present with any overt agitation, aggression, combativeness. Patient very agitated and aggressive last night despite having a sitter at the bedside.  He made no attempts to leave and or get up out of the bed.  He was compliant with nurse tech attempt to obtain vital signs, and cooperative with physical therapy.  Patient with known history of dementia, suspect probable sundowning versus delirium from hospitalization.  Today upon evaluation patient is appropriate and alert, although he will not open his eyes at request. He does acknowledge my presence, and engages well with me. He responses are slow and delayed, yet appropriate. He denies any other psychosocial stressors and or triggers that could be contributing to his level of agitation.  He does not appear to have insight or judgment at this time.   He also denies any threat or intent to harm anyone else.   Overall patient reports much improvement in his symptoms at this time as evident by his interaction with team, decrease in behavioral disturbances and agitation.  He currently denies any  depression or anxiety.  He denies any suicidal thoughts, homicidal thoughts, and or auditory visual hallucinations.  He does not appear to be responding to internal stimuli and or external stimuli.  Discussed with nurse tech, who further confirms patient has not been disruptive and or agitated.   Psychiatry will sign off at this time.  -Recommend refraining from benzodiazepines due to paradoxical worsening of agitation and her neuropsychiatric symptoms.  -Given the risk for injury to self and others, I recommend continuing low dose Haldol 1 mg q6hr for extreme agitation. -Recommend initiate in delirium precautions. -Consider safety sitter while asleep (nighttime).  As noted above patient was not displaying any disruptive behaviors, agitation, or aggression. -Once patient is medically stable, recommend discharging home/residence for resolution of suspected delirium.

## 2022-02-08 NOTE — Progress Notes (Addendum)
PROGRESS NOTE    Robert Beltran  SEG:[id number] DOB: [birth year] DOA: 02/04/2022 PCP: Garwin Brothers, MD   Brief Narrative:  88 year old male with history of BPH, diabetes mellitus type 2, hypertension, recent admission in 10/2021 for pneumonia and pulmonary embolism for which he was started on oral Eliquis presented with decreased activity/oral intake and dark bowel movements.  On presentation, hemoglobin was 6 with WBC of 31.3, creatinine of 1.98 (baseline of 0.9-1.01), potassium of 6, sodium 147, lactic acid of 2.3 then 2.7.  He was noted to have dark melanotic stool.  He was admitted to ICU under PCCM service.  He was started on IV PPI.  He was given Kcentra in the ER along with 2 units of PRBC and 1 unit of FFP.  GI was consulted.  He underwent EGD on 02/05/2022 which showed nonbleeding duodenal ulcer.  He was transfused 1 more unit of PRBC on 02/06/2022.  His care was transferred to [facility] from 02/06/2022 onwards.  Palliative care was consulted for goals of care discussion.  Assessment & Plan:   Upper GI bleeding/nonbleeding duodenal ulcer Acute blood loss anemia -Status post 3 units PRBC and 1 unit FFP transfusion during the hospitalization.  Hemoglobin was 8.1 earlier this morning for which she received his third unit of PRBC.  Hemoglobin 9.7 this morning.  Monitor H&H -Currently on Protonix IV every 12 hours.  GI has signed off.  Had EGD on 02/05/2022 which showed nonbleeding duodenal ulcer with clean ulcer base.  GI recommends to continue PPI daily indefinitely and to hold anticoagulation for another 2 to 4 weeks if possible.  Leukocytosis -Resolved  Thrombocytopenia -Monitor.    Hypernatremia -Possibly from poor oral intake.  Continue current IV fluids with dextrose.  Improving.  Repeat  a.m. labs.  Acute kidney injury -Baseline creatinine of 0.9-1.01.  Presented with creatinine of 1.98.  Improving to 0.9 today.  Repeat a.m. labs. -Renal ultrasound showed mild left-sided hydronephrosis with  no visible stones, layering debris in bladder which could be infectious versus hemorrhagic.  Might need outpatient follow-up with urology  Lactic acidosis -Treated with IV fluids.  Improving.  Hyperkalemia -Resolved  Hypokalemia -Replace.  Repeat a.m. labs  History of pulmonary emboli -History of subsegmental PE in 10/2021. -No need for further anticoagulation for this PE as per pulmonary.  Diabetes mellitus type 2 with hypoglycemia -Continue CBGs with SSI  Chronic diastolic heart failure -Strict input and output.  Daily weights.  Currently compensated.  Hypertension -Blood pressure currently stable.  Antihypertensives on hold.  Acute metabolic encephalopathy/delirium Possible undiagnosed underlying dementia Goals of care -Patient has had issues with delirium/sundowning and apparently happens in the SNF that he is currently made.  Monitor mental status.  PT eval.  Follow cultures. -Palliative care following for goals of care.  Currently remains full code. -Currently remains NPO.  SLP following -Patient was still extremely agitated overnight requiring IV Haldol.  We will consult psychiatry for extreme agitation.  DVT prophylaxis: SCDs Code Status: Full Family Communication: None at bedside Disposition Plan: Status is: Inpatient Remains inpatient appropriate because: Of severity of illness  Consultants: GI/PCCM/palliative care  Procedures: EGD  Antimicrobials: None   Subjective: Patient seen and examined at bedside. Sleepy, wakes up very slightly, poor historian.  Patient still required IV Haldol overnight for severe agitation.  No fever, vomiting reported. objective: Vitals:   02/07/22 1300 02/07/22 1529 02/07/22 2119 02/08/22 0403  BP: (!) 128/56 (!) 178/68 (!) 168/63 (!) 142/65  Pulse: 73 78 85 87  Resp: (!) '23 16 18 14  '$ Temp:  97.6 F (36.4 C) 98.4 F (36.9 C) 98.5 F (36.9 C)  TempSrc:  Oral Oral Oral  SpO2: 100% 98% 90% 93%  Weight:      Height:         Intake/Output Summary (Last 24 hours) at 02/08/2022 0820 Last data filed at 02/08/2022 0701 Gross per 24 hour  Intake 1151.8 ml  Output 1200 ml  Net -48.2 ml    Filed Weights   02/05/22 0500 02/06/22 0447 02/07/22 0500  Weight: 69.2 kg 67.1 kg 67.4 kg    Examination:  General: No acute distress.  Currently on 2 L oxygen via nasal cannula.  Elderly male lying in bed.   ENT/neck: No palpable neck masses or JVD elevation noted  respiratory: Bilateral breath sounds at bases with scattered crackles, no wheezing CVS: Rate controlled currently; S1 and S2 are heard Abdominal: Soft, nontender, distal and mildly; no organomegaly, bowel sounds are heard normally  extremities: No clubbing; mild lower extremity edema present  CNS: Sleepy, wakes up slightly, confused.  Poor historian.  No focal neurologic deficit.  Moving extremities Lymph: No cervical lymphadenopathy noted  skin: No obvious rashes/petechiae psych: Currently not agitated.  Extremely flat affect. Musculoskeletal: No obvious joint swelling/deformity   Data Reviewed: I have personally reviewed following labs and imaging studies  CBC: Recent Labs  Lab 02/04/22 1815 02/05/22 0747 02/05/22 1616 02/06/22 0028 02/06/22 0950 02/07/22 0327 02/08/22 0527  WBC 31.3*  --  18.1* 14.0* 13.1* 8.8 9.0  NEUTROABS 28.1*  --   --   --   --  7.2 7.0  HGB 6.0*   < > 9.4* 8.1* 9.7* 9.6* 9.7*  HCT 19.1*   < > 29.1* 25.3* 30.2* 30.5* 31.6*  MCV 99.5  --  93.9 95.8 95.6 96.5 97.5  PLT 215  --  148* 142* 142* 147* 156   < > = values in this interval not displayed.    Basic Metabolic Panel: Recent Labs  Lab 02/04/22 1815 02/05/22 0747 02/06/22 0028 02/07/22 0327 02/08/22 0527  NA 147* 149* 153* 155* 149*  K 6.0* 5.0 4.2 3.4* 3.1*  CL 110 113* 117* 122* 114*  CO2 '28 28 28 29 29  '$ GLUCOSE 162* 103* 111* 148* 115*  BUN 119* 110* 89* 55* 32*  CREATININE 1.98* 1.64* 1.43* 1.24 0.90  CALCIUM 9.4 8.6* 8.6* 8.8* 8.4*  MG  --  2.1   2.1  --  2.1 1.7  PHOS  --  5.9*  5.9*  --   --   --     GFR: Estimated Creatinine Clearance: 46.8 mL/min (by C-G formula based on SCr of 0.9 mg/dL). Liver Function Tests: Recent Labs  Lab 02/04/22 1815 02/08/22 0527  AST 23 20  ALT 10 11  ALKPHOS 44 46  BILITOT 0.4 0.5  PROT 5.8* 5.3*  ALBUMIN 3.0* 2.8*    Recent Labs  Lab 02/05/22 0747  LIPASE 44    Recent Labs  Lab 02/08/22 0527  AMMONIA <10   Coagulation Profile: Recent Labs  Lab 02/04/22 1815  INR 2.2*    Cardiac Enzymes: No results for input(s): "CKTOTAL", "CKMB", "CKMBINDEX", "TROPONINI" in the last 168 hours. BNP (last 3 results) No results for input(s): "PROBNP" in the last 8760 hours. HbA1C: No results for input(s): "HGBA1C" in the last 72 hours. CBG: Recent Labs  Lab 02/07/22 1654 02/07/22 2114 02/08/22 0020 02/08/22 0355 02/08/22 0759  GLUCAP 106* 120* 127* 105* 118*  Lipid Profile: No results for input(s): "CHOL", "HDL", "LDLCALC", "TRIG", "CHOLHDL", "LDLDIRECT" in the last 72 hours. Thyroid Function Tests: Recent Labs    02/08/22 0527  TSH 3.266   Anemia Panel: Recent Labs    02/08/22 0527  VITAMINB12 885   Sepsis Labs: Recent Labs  Lab 02/04/22 1815 02/04/22 2035 02/05/22 1616  LATICACIDVEN 2.3* 2.7* 1.5     Recent Results (from the past 240 hour(s))  Culture, blood (Routine x 2)     Status: None (Preliminary result)   Collection Time: 02/04/22  6:15 PM   Specimen: BLOOD  Result Value Ref Range Status   Specimen Description   Final    BLOOD LEFT ANTECUBITAL Performed at [facility], [place] [address], [place], [place] [address]    Special Requests   Final    BOTTLES DRAWN AEROBIC AND ANAEROBIC Blood Culture adequate volume Performed at [facility] [address] [place], [place] [address]    Culture   Final    NO GROWTH 4 DAYS Performed at [facility], [place] [address], [place], [place] [address]    Report  Status PENDING  Incomplete  Resp Panel by RT-PCR (Flu A&B, Covid) Anterior Nasal Swab     Status: None   Collection Time: 02/04/22  6:32 PM   Specimen: Anterior Nasal Swab  Result Value Ref Range Status   SARS Coronavirus 2 by RT PCR NEGATIVE NEGATIVE Final    Comment: (NOTE) SARS-CoV-2 target nucleic acids are NOT DETECTED.  The SARS-CoV-2 RNA is generally detectable in upper respiratory specimens during the acute phase of infection. The lowest concentration of SARS-CoV-2 viral copies this assay can detect is 138 copies/mL. A negative result does not preclude SARS-Cov-2 infection and should not be used as the sole basis for treatment or other patient management decisions. A negative result may occur with  improper specimen collection/handling, submission of specimen other than nasopharyngeal swab, presence of viral mutation(s) within the areas targeted by this assay, and inadequate number of viral copies(<138 copies/mL). A negative result must be combined with clinical observations, patient history, and epidemiological information. The expected result is Negative.  Fact Sheet for Patients:  EntrepreneurPulse.com.au  Fact Sheet for Healthcare Providers:  IncredibleEmployment.be  This test is no t yet approved or cleared by the [place] FDA and  has been authorized for detection and/or diagnosis of SARS-CoV-2 by FDA under an Emergency Use Authorization (EUA). This EUA will remain  in effect (meaning this test can be used) for the duration of the COVID-19 declaration under Section 564(b)(1) of the Act, 21 U.S.C.section 360bbb-3(b)(1), unless the authorization is terminated  or revoked sooner.       Influenza A by PCR NEGATIVE NEGATIVE Final   Influenza B by PCR NEGATIVE NEGATIVE Final    Comment: (NOTE) The Xpert Xpress SARS-CoV-2/FLU/RSV plus assay is intended as an aid in the diagnosis of influenza from Nasopharyngeal swab specimens  and should not be used as a sole basis for treatment. Nasal washings and aspirates are unacceptable for Xpert Xpress SARS-CoV-2/FLU/RSV testing.  Fact Sheet for Patients: EntrepreneurPulse.com.au  Fact Sheet for Healthcare Providers: IncredibleEmployment.be  This test is not yet approved or cleared by the [place] FDA and has been authorized for detection and/or diagnosis of SARS-CoV-2 by FDA under an Emergency Use Authorization (EUA). This EUA will remain in effect (meaning this test can be used) for the duration of the COVID-19 declaration under Section 564(b)(1) of the Act, 21 U.S.C. section  360bbb-3(b)(1), unless the authorization is terminated or revoked.  Performed at [facility], [place] [address] [place], [place] [address]   Culture, blood (Routine x 2)     Status: None (Preliminary result)   Collection Time: 02/04/22  7:10 PM   Specimen: BLOOD  Result Value Ref Range Status   Specimen Description   Final    BLOOD SITE NOT SPECIFIED Performed at [facility] [address] [place], [place] [address]    Special Requests   Final    BOTTLES DRAWN AEROBIC AND ANAEROBIC Blood Culture results may not be optimal due to an inadequate volume of blood received in culture bottles Performed at [facility] [address], [place], [place] [address]    Culture   Final    NO GROWTH 4 DAYS Performed at [facility], [place] [address] [place], [place] [address]    Report Status PENDING  Incomplete  MRSA Next Gen by PCR, Nasal     Status: None   Collection Time: 02/04/22 10:23 PM   Specimen: Urine, Clean Catch; Nasal Swab  Result Value Ref Range Status   MRSA by PCR Next Gen NOT DETECTED NOT DETECTED Final    Comment: (NOTE) The GeneXpert MRSA Assay (FDA approved for NASAL specimens only), is one component of a comprehensive MRSA colonization surveillance program. It is not  intended to diagnose MRSA infection nor to guide or monitor treatment for MRSA infections. Test performance is not FDA approved in patients less than 36 years old. Performed at [facility], [place] [address], [place], [place] [address]          Radiology Studies: No results found.      Scheduled Meds:  Chlorhexidine Gluconate Cloth  6 each Topical Daily   insulin aspart  0-9 Units Subcutaneous Q4H   mirtazapine  7.5 mg Oral QHS   mouth rinse  15 mL Mouth Rinse 4 times per day   pantoprazole (PROTONIX) IV  40 mg Intravenous Q12H   Continuous Infusions:  sodium chloride Stopped (02/06/22 1643)   dextrose 75 mL/hr at 02/08/22 0701          Shawnda Mauney Starla Link, MD [facility] 02/08/2022, 8:20 AM

## 2022-02-09 ENCOUNTER — Inpatient Hospital Stay (HOSPITAL_COMMUNITY): Payer: Medicare Other

## 2022-02-09 DIAGNOSIS — D696 Thrombocytopenia, unspecified: Secondary | ICD-10-CM

## 2022-02-09 DIAGNOSIS — I5032 Chronic diastolic (congestive) heart failure: Secondary | ICD-10-CM | POA: Diagnosis not present

## 2022-02-09 DIAGNOSIS — Z86711 Personal history of pulmonary embolism: Secondary | ICD-10-CM | POA: Diagnosis not present

## 2022-02-09 DIAGNOSIS — N179 Acute kidney failure, unspecified: Secondary | ICD-10-CM | POA: Diagnosis not present

## 2022-02-09 DIAGNOSIS — K922 Gastrointestinal hemorrhage, unspecified: Secondary | ICD-10-CM | POA: Diagnosis not present

## 2022-02-09 LAB — CBC WITH DIFFERENTIAL/PLATELET
Abs Immature Granulocytes: 0.21 10*3/uL — ABNORMAL HIGH (ref 0.00–0.07)
Basophils Absolute: 0 10*3/uL (ref 0.0–0.1)
Basophils Relative: 0 %
Eosinophils Absolute: 0.3 10*3/uL (ref 0.0–0.5)
Eosinophils Relative: 2 %
HCT: 31 % — ABNORMAL LOW (ref 39.0–52.0)
Hemoglobin: 10.1 g/dL — ABNORMAL LOW (ref 13.0–17.0)
Immature Granulocytes: 2 %
Lymphocytes Relative: 7 %
Lymphs Abs: 0.9 10*3/uL (ref 0.7–4.0)
MCH: 31.5 pg (ref 26.0–34.0)
MCHC: 32.6 g/dL (ref 30.0–36.0)
MCV: 96.6 fL (ref 80.0–100.0)
Monocytes Absolute: 0.8 10*3/uL (ref 0.1–1.0)
Monocytes Relative: 6 %
Neutro Abs: 11.5 10*3/uL — ABNORMAL HIGH (ref 1.7–7.7)
Neutrophils Relative %: 83 %
Platelets: 175 10*3/uL (ref 150–400)
RBC: 3.21 MIL/uL — ABNORMAL LOW (ref 4.22–5.81)
RDW: 15.3 % (ref 11.5–15.5)
WBC: 13.8 10*3/uL — ABNORMAL HIGH (ref 4.0–10.5)
nRBC: 0 % (ref 0.0–0.2)

## 2022-02-09 LAB — BASIC METABOLIC PANEL
Anion gap: 5 (ref 5–15)
BUN: 23 mg/dL (ref 8–23)
CO2: 28 mmol/L (ref 22–32)
Calcium: 8.4 mg/dL — ABNORMAL LOW (ref 8.9–10.3)
Chloride: 111 mmol/L (ref 98–111)
Creatinine, Ser: 1.01 mg/dL (ref 0.61–1.24)
GFR, Estimated: >60 mL/min (ref >60)
Glucose, Bld: 123 mg/dL — ABNORMAL HIGH (ref 70–99)
Potassium: 3.3 mmol/L — ABNORMAL LOW (ref 3.5–5.1)
Sodium: 144 mmol/L (ref 135–145)

## 2022-02-09 LAB — CULTURE, BLOOD (ROUTINE X 2)
Culture: NO GROWTH
Culture: NO GROWTH
Special Requests: ADEQUATE

## 2022-02-09 LAB — GLUCOSE, CAPILLARY
Glucose-Capillary: 102 mg/dL — ABNORMAL HIGH (ref 70–99)
Glucose-Capillary: 110 mg/dL — ABNORMAL HIGH (ref 70–99)
Glucose-Capillary: 115 mg/dL — ABNORMAL HIGH (ref 70–99)
Glucose-Capillary: 117 mg/dL — ABNORMAL HIGH (ref 70–99)
Glucose-Capillary: 126 mg/dL — ABNORMAL HIGH (ref 70–99)
Glucose-Capillary: 126 mg/dL — ABNORMAL HIGH (ref 70–99)
Glucose-Capillary: 127 mg/dL — ABNORMAL HIGH (ref 70–99)

## 2022-02-09 LAB — MAGNESIUM: Magnesium: 1.7 mg/dL (ref 1.7–2.4)

## 2022-02-09 MED ORDER — FUROSEMIDE 10 MG/ML IJ SOLN
40.0000 mg | Freq: Once | INTRAMUSCULAR | Status: AC
  Administered 2022-02-09: 40 mg via INTRAVENOUS
  Filled 2022-02-09: qty 4

## 2022-02-09 MED ORDER — POTASSIUM CHLORIDE 10 MEQ/100ML IV SOLN
10.0000 meq | INTRAVENOUS | Status: AC
  Administered 2022-02-09 (×4): 10 meq via INTRAVENOUS
  Filled 2022-02-09 (×4): qty 100

## 2022-02-09 MED ORDER — SODIUM CHLORIDE 0.9 % IV SOLN
3.0000 g | Freq: Four times a day (QID) | INTRAVENOUS | Status: DC
  Administered 2022-02-09 – 2022-02-14 (×20): 3 g via INTRAVENOUS
  Filled 2022-02-09 (×21): qty 8

## 2022-02-09 MED ORDER — FOOD THICKENER (SIMPLYTHICK)
1.0000 | ORAL | Status: DC | PRN
Start: 2022-02-09 — End: 2022-02-18
  Filled 2022-02-09: qty 1

## 2022-02-09 NOTE — Progress Notes (Signed)
Pharmacy Antibiotic Note  Assessment: Robert Beltran is a 24 YOM admitted on 02/04/2022 with a duodenal ulcer and upper GI bleed. A modified barium swallow completed on 8/14 was concerning for dysphagia. On 8/15, he was noted to have a wet cough, and subsequent CXR showed opacity at the lung bases (particularly at the right lung base) concerning for aspiration pneumonia. Pharmacy is consulted for Unasyn initiation and dosing.  Today, 02/09/22: - WBC 13.8, elevated - Baseline LFTs WNL - SCr 1.01  Plan: - Start Unasyn 3 g IV q6h - Monitor CBC, CMP daily - Follow-up on ability to transition to PO antibiotics  Height: '5\' 10"'$  (177.8 cm) Weight: 71.5 kg (157 lb 10.1 oz) IBW/kg (Calculated) : 73  Temp (24hrs), Avg:98.1 F (36.7 C), Min:97.7 F (36.5 C), Max:98.7 F (37.1 C)  Recent Labs  Lab 02/04/22 1815 02/04/22 2035 02/05/22 0747 02/05/22 1616 02/06/22 0028 02/06/22 0950 02/07/22 0327 02/08/22 0527 02/09/22 0752  WBC 31.3*  --   --  18.1* 14.0* 13.1* 8.8 9.0 13.8*  CREATININE 1.98*  --  1.64*  --  1.43*  --  1.24 0.90 1.01  LATICACIDVEN 2.3* 2.7*  --  1.5  --   --   --   --   --     Estimated Creatinine Clearance: 44.2 mL/min (by C-G formula based on SCr of 1.01 mg/dL).    Allergies  Allergen Reactions   Aspirin Other (See Comments)    GI bleed, abdominal pain and "allergic," per [facility]   Nsaids Other (See Comments)    GI distress and "allergic," per [facility]   Other Nausea Only and Other (See Comments)    "Reaction to Coricidin"   Bactrim [Sulfamethoxazole-Trimethoprim] Rash    Antimicrobials this admission: N/A  Microbiology results: N/A  Thank you for allowing pharmacy to be a part of this patient's care.  Cannon Kettle, PharmD Candidate 02/09/2022 11:22 AM

## 2022-02-09 NOTE — Progress Notes (Signed)
Speech Language Pathology Treatment: Dysphagia  Patient Details Name: Robert Beltran MRN: [id number] DOB: 1923/09/19 Today's Date: 02/09/2022 Time: 1660-6301 SLP Time Calculation (min) (ACUTE ONLY): 25 min  Assessment / Plan / Recommendation Clinical Impression  Pt seen today for skilled SLP to address dysphagia - Note events of last night with concerns for potential aspiration of applesauce last night without ability to clear and results of CXR today.   Pt with gurgly phonation and severely dysarthric at beginning of session.   Upon repositioning with NT assist and seating pt upright - copius coughing observed without pt expelling secretions.  Suspect aspiration of secretions with overt coughing.  Pt then provided with oral care and po trials including thin, nectar and puree with hand over hand assist initially.  Delayed multiple swallows noted but no indication of aspiration or discomfort.  Mr [name] tends to talk without before swallowing despite cues to refrain from talking until he has swallowed- elevating aspiration risk.    Reviewed MBS with pt's son - showing decreased oral control, delay in pharyngeal swallow - but strong pharyngeal muscle contraction fortunately.  Pt is also quite kyphotic which likely contributes to impaired esophageal clearance and decreased cough mechanics - making it difficult for [name] to clear if he does aspirate.  Of note, pt currently in optimal position, sitting fully upright and alert.   At this time, Dr Starla Link has re-engaged palliative team for [name], appreciative of this re-consult.  Recommend floor stock of nectar liquids and medications with applesauce pending palliative meeting outcomes.    Using teach back and posting swallow precautions, educated pt's son to recommendations.  At this time, the patient does not have the cognitive abilities in this SLPs opinion to understand his dysphagia.    HPI HPI: 88 yo male adm to [facility] with GI bleed.  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  Swallow eval ordered.  Most recent MBS 10/2021 showed worsening pharyngeal swallow marked by delay, reduced tongue base retraction, pharyngeal constriction and anterior laryngeal movement - resulting in penetration and aspirtion *silent and audible*.  Throat clearing and delayed cough inconsistent with aspiration and cued cough ineffective to expel aspirate.  Chin tuck not helpful.  Small single boluses prevented aspiration and dry swallow swallow assisted to decrease retention.  Per son, pt was on a regular/thin diet PTA.      SLP Plan  Continue with current plan of care      Recommendations for follow up therapy are one component of a multi-disciplinary discharge planning process, led by the attending physician.  Recommendations may be updated based on patient status, additional functional criteria and insurance authorization.    Recommendations  Diet recommendations: Other(comment) (floor stock only) Liquids provided via: Cup;Teaspoon Medication Administration: Crushed with puree Supervision: Staff to assist with self feeding Compensations: Small sips/bites;Minimize environmental distractions;Slow rate (observe swallow prior to giving more) Postural Changes and/or Swallow Maneuvers: Seated upright 90 degrees;Upright 30-60 min after meal                Oral Care Recommendations: Oral care QID;Oral care BID;Staff/trained caregiver to provide oral care Follow Up Recommendations: Skilled nursing-short term rehab (<3 hours/day) Assistance recommended at discharge: Frequent or constant Supervision/Assistance Plan: Continue with current plan of care          [name], MS Robert Beltran [phone]  Pager [phone]  [name]  02/09/2022, 5:36 PM

## 2022-02-09 NOTE — Progress Notes (Signed)
Speech Language Pathology Treatment: Dysphagia  Patient Details Name: Robert Beltran MRN: [id number] DOB: 09-03-1923 Today's Date: 02/09/2022 Time: 9628-3662 SLP Time Calculation (min) (ACUTE ONLY): 34 min  Assessment / Plan / Recommendation Clinical Impression  SLP conducted separate session to address pt's other risk factors besides his dysphagia.  Educated pt's son to pt's suboptimal positioning likely negatively impacting esophageal clearance and ability to expel potential aspirated material.  Discussed pt's reliance on others for feeding and delay in swallow (up to 15 seconds to transit and swallow small bolus of applesauce) increasing his aspiration risk.  Bedridden status, fall 11/12/2021 with hospital admission, advanced age with diminished functional reserve also reviewed.  Advised son that feeding tubes do not produce benefits that outweigh risks per research with pt's that are over the age of 67 with cognitive deficits.  Anticipate pt will continue to have episodic aspiration with currently impaired tolerance.  With this pt, functional goal may be po diet with mitigation strategies.  Robert Beltran expressed gratitude for information and indicates variable information provided by SLP team.  Reviewed pt's status change since aspiration episode last night modifying current condition and multiple other risk factors for aspiration pneumonias including lack of ambulation, advanced age, and progresive decline - encouraged Robert Beltran to see the "big picture".   He advised that at this time, plan is remains status quo - meaning full code.  Will follow up for dysphagia management, education and tolerance of floor stock po intake.    HPI HPI: 88 yo male adm to [facility] with GI bleed.  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  Swallow eval ordered.  Most recent MBS 10/2021 showed  worsening pharyngeal swallow marked by delay, reduced tongue base retraction, pharyngeal constriction and anterior laryngeal movement - resulting in penetration and aspirtion *silent and audible*.  Throat clearing and delayed cough inconsistent with aspiration and cued cough ineffective to expel aspirate.  Chin tuck not helpful.  Small single boluses prevented aspiration and dry swallow swallow assisted to decrease retention.  Per son, pt was on a regular/thin diet PTA.      SLP Plan  Continue with current plan of care      Recommendations for follow up therapy are one component of a multi-disciplinary discharge planning process, led by the attending physician.  Recommendations may be updated based on patient status, additional functional criteria and insurance authorization.    Recommendations  Diet recommendations: Other(comment) (floor stock) Liquids provided via: Cup;Teaspoon Medication Administration: Crushed with puree Supervision: Staff to assist with self feeding Compensations: Small sips/bites;Minimize environmental distractions;Slow rate Postural Changes and/or Swallow Maneuvers: Seated upright 90 degrees;Upright 30-60 min after meal                Oral Care Recommendations: Oral care QID;Oral care BID;Staff/trained caregiver to provide oral care Follow Up Recommendations: Skilled nursing-short term rehab (<3 hours/day) Assistance recommended at discharge: Frequent or constant Supervision/Assistance SLP Visit Diagnosis: Dysphagia, oropharyngeal phase (R13.12) Plan: Continue with current plan of care         Robert Lime, MS [place] Office [phone] Pager [phone]   Robert Beltran  02/09/2022, 7:23 PM

## 2022-02-09 NOTE — [place] FL2 (Signed)
[place] MEDICAID FL2 LEVEL OF CARE SCREENING TOOL     IDENTIFICATION  Patient Name: Robert Beltran Birthdate: [birth year] Sex: male Admission Date (Current Location): 02/04/2022  [facility] and [place] Number:  Herbalist and Address:  [facility],  [address], [place]      Provider Number: [id number]  Attending Physician Name and Address:  Aline August, MD  Relative Name and Phone Number:  [place], [place] [phone]  [phone]    Current Level of Care: Hospital Recommended Level of Care: [place] Prior Approval Number:    Date Approved/Denied:   PASRR Number: [id number]  Discharge Plan: SNF    Current Diagnoses: Patient Active Problem List   Diagnosis Date Noted   Delirium 02/07/2022   Hyperkalemia 02/06/2022   Leukocytosis 02/06/2022   Thrombocytopenia ([place]) 02/06/2022   Hypernatremia 02/06/2022   AKI (acute kidney injury) ([place]) 02/06/2022   Lactic acidosis 02/06/2022   History of pulmonary embolism 02/06/2022   GI bleed 02/04/2022   Severe sepsis ([place]) 11/13/2021   Acute respiratory failure with hypoxia ([place]) 11/13/2021   Fall at home, initial encounter 11/13/2021   HLD (hyperlipidemia)    Hypertension    Chronic diastolic CHF (congestive heart failure) ([place])    Multifocal pneumonia 11/12/2021   Severe obstructive sleep apnea-hypopnea syndrome 10/29/2019   Controlled type 2 diabetes mellitus without complication, without long-term current use of insulin (HCC) 09/17/2019   Excessive daytime sleepiness 09/17/2019   Snoring 09/17/2019   Muscle cramp, nocturnal 09/17/2019   Spinal stenosis of lumbar region with neurogenic claudication 09/17/2019   Nocturia more than twice per night 09/17/2019   Left bundle branch block 08/21/2012    Orientation RESPIRATION BLADDER Height & Weight     Self  O2 Incontinent Weight: 157 lb 10.1 oz (71.5 kg) Height:  '5\' 10"'$  (177.8 cm)  BEHAVIORAL SYMPTOMS/MOOD  NEUROLOGICAL BOWEL NUTRITION STATUS      Incontinent Diet  AMBULATORY STATUS COMMUNICATION OF NEEDS Skin   Limited Assist Verbally                         Personal Care Assistance Level of Assistance  Bathing, Feeding, Dressing Bathing Assistance: Limited assistance Feeding assistance: Limited assistance Dressing Assistance: Limited assistance     Functional Limitations Info  Sight, Hearing, Speech Sight Info: Adequate Hearing Info: Adequate Speech Info: Adequate    SPECIAL CARE FACTORS FREQUENCY  PT (By licensed PT), OT (By licensed OT)     PT Frequency: Minimum 5x a week OT Frequency: Minimum 5x a week            Contractures Contractures Info: Not present    Additional Factors Info  Code Status, Allergies, Insulin Sliding Scale Code Status Info: Full Code Allergies Info: Aspirin   Nsaids   Other   Bactrim (Sulfamethoxazole-trimethoprim)   Insulin Sliding Scale Info: insulin aspart (novoLOG) injection 0-9 Units 4x a day.       Current Medications (02/09/2022):  This is the current hospital active medication list Current Facility-Administered Medications  Medication Dose Route Frequency Provider Last Rate Last Admin   0.9 %  sodium chloride infusion  250 mL Intravenous Continuous Frederik Pear, MD   Stopped at 02/06/22 1643   Ampicillin-Sulbactam (UNASYN) 3 g in sodium chloride 0.9 % 100 mL IVPB  3 g Intravenous Q6H Bauswell, Hannah, Student-PharmD 200 mL/hr at 02/09/22 1512 3 g at 02/09/22 1512   Chlorhexidine Gluconate Cloth 2 % PADS  6 each  6 each Topical Daily Carol Ada, MD   6 each at 02/09/22 0937   dextrose 5 % solution   Intravenous Continuous Aline August, MD 50 mL/hr at 02/09/22 0845 Rate Change at 02/09/22 0845   docusate sodium (COLACE) capsule 100 mg  100 mg Oral BID PRN Carol Ada, MD       haloperidol lactate (HALDOL) injection 1 mg  1 mg Intravenous Q6H PRN Samella Parr, NP   1 mg at 02/08/22 0412   insulin aspart (novoLOG)  injection 0-9 Units  0-9 Units Subcutaneous Q4H Carol Ada, MD   1 Units at 02/09/22 1237   Oral care mouth rinse  15 mL Mouth Rinse 4 times per day Aline August, MD   15 mL at 02/09/22 1512   Oral care mouth rinse  15 mL Mouth Rinse PRN Aline August, MD       pantoprazole (PROTONIX) injection 40 mg  40 mg Intravenous Q24H Alekh, Kshitiz, MD   40 mg at 02/09/22 1059   polyethylene glycol (MIRALAX / GLYCOLAX) packet 17 g  17 g Oral Daily PRN Carol Ada, MD         Discharge Medications: Please see discharge summary for a list of discharge medications.  Relevant Imaging Results:  Relevant Lab Results:   Additional Information SSN [id number]  Ross Ludwig, LCSW

## 2022-02-09 NOTE — Progress Notes (Signed)
PROGRESS NOTE    Robert Beltran  GQQ:[id number] DOB: Dec 22, 1923 DOA: 02/04/2022 PCP: Garwin Brothers, MD   Brief Narrative:  88 year old male with history of BPH, diabetes mellitus type 2, hypertension, recent admission in 10/2021 for pneumonia and pulmonary embolism for which he was started on oral Eliquis presented with decreased activity/oral intake and dark bowel movements.  On presentation, hemoglobin was 6 with WBC of 31.3, creatinine of 1.98 (baseline of 0.9-1.01), potassium of 6, sodium 147, lactic acid of 2.3 then 2.7.  He was noted to have dark melanotic stool.  He was admitted to ICU under PCCM service.  He was started on IV PPI.  He was given Kcentra in the ER along with 2 units of PRBC and 1 unit of FFP.  GI was consulted.  He underwent EGD on 02/05/2022 which showed nonbleeding duodenal ulcer.  He was transfused 1 more unit of PRBC on 02/06/2022.  His care was transferred to [facility] from 02/06/2022 onwards.  Palliative care was consulted for goals of care discussion.  Assessment & Plan:   Upper GI bleeding/nonbleeding duodenal ulcer Acute blood loss anemia -Status post 3 units PRBC and 1 unit FFP transfusion during the hospitalization.  Hemoglobin was 8.1 earlier this morning for which she received his third unit of PRBC.  Hemoglobin 9.7 on 02/08/2022.  Pending this morning.  Monitor H&H -GI has signed off.  Had EGD on 02/05/2022 which showed nonbleeding duodenal ulcer with clean ulcer base.  GI recommended to continue PPI daily indefinitely and to hold anticoagulation for another 2 to 4 weeks if possible.  Currently on PPI IV daily.  Leukocytosis -Resolved  Thrombocytopenia -Resolved  Hypernatremia -Possibly from poor oral intake.  Decrease dextrose to 50 cc an hour.  Improving.  Labs pending for today.  Repeat  a.m. labs.  Acute kidney injury -Baseline creatinine of 0.9-1.01.  Presented with creatinine of 1.98.  Improving to 0.9 on 02/08/2022.  Pending for today.  Repeat a.m. labs. -Renal  ultrasound showed mild left-sided hydronephrosis with no visible stones, layering debris in bladder which could be infectious versus hemorrhagic.  Might need outpatient follow-up with urology  Lactic acidosis -Treated with IV fluids.  Improving.  Hypokalemia -Labs pending for today  History of pulmonary emboli -History of subsegmental PE in 10/2021. -No need for further anticoagulation for this PE as per pulmonary.  Diabetes mellitus type 2 with hypoglycemia -Continue CBGs with SSI  Chronic diastolic heart failure -Strict input and output.  Daily weights.  Currently compensated.  Hypertension -Blood pressure currently stable.  Antihypertensives on hold.  Acute metabolic encephalopathy/delirium Possible undiagnosed underlying dementia Goals of care -Patient has had issues with delirium/sundowning and apparently happens in the SNF that he is currently made.  Monitor mental status.  PT eval.  Follow cultures. -Palliative care following for goals of care.  Currently remains full code. -Has required intermittent IV Haldol during this hospitalization.  Psych consulted on 02/08/2022 and recommended to continue IV Haldol as needed and signed off. -Patient was placed on diet as per SLP recommendations on 02/08/2022.  Diet was subsequently held overnight for wet cough with attempts at oral intake.  We will follow further SLP recommendations.  Will check chest x-ray. -We will reengage palliative care as patient's condition is not improving and still is intermittently agitated with poor oral intake.  DVT prophylaxis: SCDs Code Status: Full Family Communication: None at bedside Disposition Plan: Status is: Inpatient Remains inpatient appropriate because: Of severity of illness  Consultants: GI/PCCM/palliative care  Procedures: EGD  Antimicrobials: None   Subjective: Patient seen and examined at bedside.  Poor historian.  Slightly confused.  Nursing staff reports cough overnight with  attempts to oral intake.  No fever, vomiting reported.   Objective: Vitals:   02/08/22 1424 02/08/22 2133 02/09/22 0632 02/09/22 0700  BP:  (!) 127/51 (!) 120/52   Pulse: 72 74 75   Resp:  20 16   Temp:  98.7 F (37.1 C) 97.7 F (36.5 C)   TempSrc:  Oral Oral   SpO2: 90% 95% 91%   Weight:    71.5 kg  Height:        Intake/Output Summary (Last 24 hours) at 02/09/2022 0808 Last data filed at 02/09/2022 0700 Gross per 24 hour  Intake 816.18 ml  Output 400 ml  Net 416.18 ml    Filed Weights   02/06/22 0447 02/07/22 0500 02/09/22 0700  Weight: 67.1 kg 67.4 kg 71.5 kg    Examination:  General: On 2 L oxygen via nasal cannula.  No distress.  Elderly male lying in bed.   ENT/neck: No obvious JVD elevation noted.  No palpable thyromegaly respiratory: Decreased breath sounds at bases bilaterally, scattered crackles  CVS: S1-S2 heard; mostly rate controlled Abdominal: Soft, nontender, still slightly distended; no organomegaly, normal bowel sounds are heard  extremities: Trace lower extremity edema present; no cyanosis  CNS: Wakes up very slightly; confused.  Poor historian.  No focal neurologic deficit.  Moves extremities Lymph: No obvious lymphadenopathy palpable  skin: No obvious masses/lesions psych: Flat affect.  Not agitated currently.   Musculoskeletal: No obvious joint swelling/deformity   Data Reviewed: I have personally reviewed following labs and imaging studies  CBC: Recent Labs  Lab 02/04/22 1815 02/05/22 0747 02/05/22 1616 02/06/22 0028 02/06/22 0950 02/07/22 0327 02/08/22 0527  WBC 31.3*  --  18.1* 14.0* 13.1* 8.8 9.0  NEUTROABS 28.1*  --   --   --   --  7.2 7.0  HGB 6.0*   < > 9.4* 8.1* 9.7* 9.6* 9.7*  HCT 19.1*   < > 29.1* 25.3* 30.2* 30.5* 31.6*  MCV 99.5  --  93.9 95.8 95.6 96.5 97.5  PLT 215  --  148* 142* 142* 147* 156   < > = values in this interval not displayed.    Basic Metabolic Panel: Recent Labs  Lab 02/04/22 1815 02/05/22 0747  02/06/22 0028 02/07/22 0327 02/08/22 0527  NA 147* 149* 153* 155* 149*  K 6.0* 5.0 4.2 3.4* 3.1*  CL 110 113* 117* 122* 114*  CO2 '28 28 28 29 29  '$ GLUCOSE 162* 103* 111* 148* 115*  BUN 119* 110* 89* 55* 32*  CREATININE 1.98* 1.64* 1.43* 1.24 0.90  CALCIUM 9.4 8.6* 8.6* 8.8* 8.4*  MG  --  2.1  2.1  --  2.1 1.7  PHOS  --  5.9*  5.9*  --   --   --     GFR: Estimated Creatinine Clearance: 49.7 mL/min (by C-G formula based on SCr of 0.9 mg/dL). Liver Function Tests: Recent Labs  Lab 02/04/22 1815 02/08/22 0527  AST 23 20  ALT 10 11  ALKPHOS 44 46  BILITOT 0.4 0.5  PROT 5.8* 5.3*  ALBUMIN 3.0* 2.8*    Recent Labs  Lab 02/05/22 0747  LIPASE 44    Recent Labs  Lab 02/08/22 0527  AMMONIA <10    Coagulation Profile: Recent Labs  Lab 02/04/22 1815  INR 2.2*    Cardiac Enzymes: No results for  input(s): "CKTOTAL", "CKMB", "CKMBINDEX", "TROPONINI" in the last 168 hours. BNP (last 3 results) No results for input(s): "PROBNP" in the last 8760 hours. HbA1C: No results for input(s): "HGBA1C" in the last 72 hours. CBG: Recent Labs  Lab 02/08/22 1634 02/08/22 2010 02/09/22 0023 02/09/22 0405 02/09/22 0752  GLUCAP 123* 173* 102* 126* 115*    Lipid Profile: No results for input(s): "CHOL", "HDL", "LDLCALC", "TRIG", "CHOLHDL", "LDLDIRECT" in the last 72 hours. Thyroid Function Tests: Recent Labs    02/08/22 0527  TSH 3.266    Anemia Panel: Recent Labs    02/08/22 0527  VITAMINB12 885    Sepsis Labs: Recent Labs  Lab 02/04/22 1815 02/04/22 2035 02/05/22 1616  LATICACIDVEN 2.3* 2.7* 1.5     Recent Results (from the past 240 hour(s))  Culture, blood (Routine x 2)     Status: None (Preliminary result)   Collection Time: 02/04/22  6:15 PM   Specimen: BLOOD  Result Value Ref Range Status   Specimen Description   Final    BLOOD LEFT ANTECUBITAL Performed at [facility], [place] [address] [place], [place] [address]    Special  Requests   Final    BOTTLES DRAWN AEROBIC AND ANAEROBIC Blood Culture adequate volume Performed at [facility] [address], [place], [place] [address]    Culture   Final    NO GROWTH 4 DAYS Performed at [facility], [place] [address] [place],  [address]    Report Status PENDING  Incomplete  Resp Panel by RT-PCR (Flu A&B, Covid) Anterior Nasal Swab     Status: None   Collection Time: 02/04/22  6:32 PM   Specimen: Anterior Nasal Swab  Result Value Ref Range Status   SARS Coronavirus 2 by RT PCR NEGATIVE NEGATIVE Final    Comment: (NOTE) SARS-CoV-2 target nucleic acids are NOT DETECTED.  The SARS-CoV-2 RNA is generally detectable in upper respiratory specimens during the acute phase of infection. The lowest concentration of SARS-CoV-2 viral copies this assay can detect is 138 copies/mL. A negative result does not preclude SARS-Cov-2 infection and should not be used as the sole basis for treatment or other patient management decisions. A negative result may occur with  improper specimen collection/handling, submission of specimen other than nasopharyngeal swab, presence of viral mutation(s) within the areas targeted by this assay, and inadequate number of viral copies(<138 copies/mL). A negative result must be combined with clinical observations, patient history, and epidemiological information. The expected result is Negative.  Fact Sheet for Patients:  EntrepreneurPulse.com.au  Fact Sheet for Healthcare Providers:  IncredibleEmployment.be  This test is no t yet approved or cleared by the [place] FDA and  has been authorized for detection and/or diagnosis of SARS-CoV-2 by FDA under an Emergency Use Authorization (EUA). This EUA will remain  in effect (meaning this test can be used) for the duration of the COVID-19 declaration under Section 564(b)(1) of the Act, 21 U.S.C.section 360bbb-3(b)(1), unless  the authorization is terminated  or revoked sooner.       Influenza A by PCR NEGATIVE NEGATIVE Final   Influenza B by PCR NEGATIVE NEGATIVE Final    Comment: (NOTE) The Xpert Xpress SARS-CoV-2/FLU/RSV plus assay is intended as an aid in the diagnosis of influenza from Nasopharyngeal swab specimens and should not be used as a sole basis for treatment. Nasal washings and aspirates are unacceptable for Xpert Xpress SARS-CoV-2/FLU/RSV testing.  Fact Sheet for Patients: EntrepreneurPulse.com.au  Fact Sheet for Healthcare  Providers: IncredibleEmployment.be  This test is not yet approved or cleared by the [place] and has been authorized for detection and/or diagnosis of SARS-CoV-2 by FDA under an Emergency Use Authorization (EUA). This EUA will remain in effect (meaning this test can be used) for the duration of the COVID-19 declaration under Section 564(b)(1) of the Act, 21 U.S.C. section 360bbb-3(b)(1), unless the authorization is terminated or revoked.  Performed at [facility], [place] [address], [place], [place] [address]   Culture, blood (Routine x 2)     Status: None (Preliminary result)   Collection Time: 02/04/22  7:10 PM   Specimen: BLOOD  Result Value Ref Range Status   Specimen Description   Final    BLOOD SITE NOT SPECIFIED Performed at [facility] [address], [place], [place] [address]    Special Requests   Final    BOTTLES DRAWN AEROBIC AND ANAEROBIC Blood Culture results may not be optimal due to an inadequate volume of blood received in culture bottles Performed at [facility] [address] [place], [place] [address]    Culture   Final    NO GROWTH 4 DAYS Performed at [facility], [place] [address], [place], [place] [address]    Report Status PENDING  Incomplete  MRSA Next Gen by PCR, Nasal     Status: None   Collection Time: 02/04/22 10:23 PM    Specimen: Urine, Clean Catch; Nasal Swab  Result Value Ref Range Status   MRSA by PCR Next Gen NOT DETECTED NOT DETECTED Final    Comment: (NOTE) The GeneXpert MRSA Assay (FDA approved for NASAL specimens only), is one component of a comprehensive MRSA colonization surveillance program. It is not intended to diagnose MRSA infection nor to guide or monitor treatment for MRSA infections. Test performance is not FDA approved in patients less than 52 years old. Performed at [facility], [place] [address] [place], [place] [address]          Radiology Studies: DG Swallowing Func-Speech Pathology  Result Date: 02/08/2022 Table formatting from the original result was not included. Objective Swallowing Evaluation: Type of Study: MBS-Modified Barium Swallow Study  Patient Details Name: Robert Beltran MRN: [id number] Date of Birth: 1924-05-08 Today's Date: 02/08/2022 Time: SLP Start Time (ACUTE ONLY): 53 -SLP Stop Time (ACUTE ONLY): 1435 SLP Time Calculation (min) (ACUTE ONLY): 20 min Past Medical History: Past Medical History: Diagnosis Date  Arthritis   Benign prostate hyperplasia   Cancer ([place])   testicular cancer 1964  Chronic back pain   Depression   Diabetes mellitus without complication (HCC)   DJD (degenerative joint disease)   GERD (gastroesophageal reflux disease)   HLD (hyperlipidemia)   Hypertension   sees Dr. Crist Infante,  Past Surgical History: Past Surgical History: Procedure Laterality Date  APPENDECTOMY    1995  ESOPHAGOGASTRODUODENOSCOPY (EGD) WITH PROPOFOL N/A 02/05/2022  Procedure: ESOPHAGOGASTRODUODENOSCOPY (EGD) WITH PROPOFOL;  Surgeon: Carol Ada, MD;  Location: [facility] ENDOSCOPY;  Service: Gastroenterology;  Laterality: N/A;  EYE SURGERY    bilateral surgery  LUMBAR LAMINECTOMY/DECOMPRESSION MICRODISCECTOMY N/A 08/29/2012  Procedure: LUMBAR LAMINECTOMY/DECOMPRESSION MICRODISCECTOMY 2 LEVELS;  Surgeon: Eustace Moore, MD;  Location: [facility]URO ORS;  Service: Neurosurgery;   Laterality: N/A;  SHOULDER SURGERY    right HPI: 88 yo male adm to [facility] with GI bleed.  Pt is s/p EGD- he has h/o cognitive deficits - resides at [name], pna, dysphagia, GERD, HLD, HTN, has cervical  ostephytes near C4-C6 with moderate anterior protrusioni into pharynx.  Swallow eval ordered.  Most recent MBS 10/2021 showed worsening pharyngeal swallow marked by delay, reduced tongue base retraction, pharyngeal constriction and anterior laryngeal movement - resulting in penetration and aspirtion *silent and audible*.  Throat clearing and delayed cough inconsistent with aspiration and cued cough ineffective to expel aspirate.  Chin tuck not helpful.  Small single boluses prevented aspiration and dry swallow swallow assisted to decrease retention.  Per son, pt was on a regular/thin diet PTA.  Subjective: awake, pleasant, cooperative  Recommendations for follow up therapy are one component of a multi-disciplinary discharge planning process, led by the attending physician.  Recommendations may be updated based on patient status, additional functional criteria and insurance authorization. Assessment / Plan / Recommendation   02/08/2022   2:54 PM Clinical Impressions Clinical Impression Patient's swallow function does not appear to have changed significantly since previous one complete 3 months ago. He exhibited oral delays with all consistencies but more pronounced with puree solids as he would appear to be chewing puree and had decreased bolus cohesion, leading to anterior to posterior transit delays. During pharyngeal phase of swallow, he exhibited swallow initiation delays at level of vallecular sinus with puree solids, nectar thick liquids and small cup sips of thin liquids but with larger cup sips of thin liquids, patient exhibited swallow initiation delay at level of pyriform sinus. Only a trace amount of PO residuals observed in vallecular sinus with thin and nectar thick liquids and with puree solids and all  residuals cleared pharynx with subsequent swallows. Patient did exhibit any instance of flash penetration (PAS 2) with nectar thick liquids when drinking with a straw but no penetration or aspiration of nectar thick liquids via cup sips. With thin liquids, patient exhibited penetration that did not clear (PAS 3) with larger sip but only flash penetration (PAS 2) with small controlled cup sips. 13 mm barium tablet taken with puree solids transited through UES without significant difficulty and esophageal sweep did not reveal any significant dysmotility or retrograde movement of barium. SLP is recommending to initiate a conservative diet of Dys 1 (puree) solids and nectar thick liquids with the option to have thin liquids in between meals. SLP contacted patient's son via phone call to discuss results and plan. SLP will continue to follow for diet toleration and ability to advance with solids and liquids. Also recommending patient resume SLP therapy when back at SNF. SLP Visit Diagnosis Dysphagia, oropharyngeal phase (R13.12) Impact on safety and function Mild aspiration risk     02/08/2022   2:54 PM Treatment Recommendations Treatment Recommendations Therapy as outlined in treatment plan below     02/08/2022   3:00 PM Prognosis Prognosis for Safe Diet Advancement Good Barriers to Reach Goals Severity of deficits;Cognitive deficits   02/08/2022   2:54 PM Diet Recommendations SLP Diet Recommendations Dysphagia 1 (Puree) solids;Nectar thick liquid Liquid Administration via Cup;No straw Medication Administration Whole meds with puree Compensations Small sips/bites;Minimize environmental distractions;Slow rate     02/08/2022   2:54 PM Other Recommendations Oral Care Recommendations Oral care BID;Staff/trained caregiver to provide oral care Other Recommendations Order thickener from pharmacy;Prohibited food (jello, ice cream, thin soups);Have oral suction available;Clarify dietary restrictions Follow Up Recommendations Skilled  nursing-short term rehab (<3 hours/day) Assistance recommended at discharge Frequent or constant Supervision/Assistance   02/08/2022   2:54 PM Frequency and Duration  Speech Therapy Frequency (ACUTE ONLY) min 2x/week Treatment Duration 1 week     02/08/2022  2:41 PM Oral Phase Oral Phase Impaired Oral - Nectar Cup Reduced posterior propulsion;Weak lingual manipulation Oral - Nectar Straw Reduced posterior propulsion;Weak lingual manipulation Oral - Thin Cup Delayed oral transit;Reduced posterior propulsion;Weak lingual manipulation;Premature spillage Oral - Puree Weak lingual manipulation;Reduced posterior propulsion;Delayed oral transit Oral - Pill [facility]    02/08/2022   2:42 PM Pharyngeal Phase Pharyngeal Phase Impaired Pharyngeal- Nectar Cup Delayed swallow initiation-vallecula;Pharyngeal residue - valleculae Pharyngeal- Nectar Straw Delayed swallow initiation-vallecula;Reduced airway/laryngeal closure;Pharyngeal residue - valleculae;Penetration/Aspiration during swallow Pharyngeal Material enters airway, remains ABOVE vocal cords then ejected out Pharyngeal- Thin Cup Delayed swallow initiation-vallecula;Delayed swallow initiation-pyriform sinuses;Reduced airway/laryngeal closure;Penetration/Aspiration during swallow;Pharyngeal residue - valleculae Pharyngeal Material enters airway, remains ABOVE vocal cords and not ejected out Pharyngeal- Puree Delayed swallow initiation-vallecula;Pharyngeal residue - valleculae Pharyngeal- Pill [facility]    02/08/2022   2:44 PM Cervical Esophageal Phase  Cervical Esophageal Phase [facility] Sonia Baller, MA, CCC-SLP Speech Therapy                          Scheduled Meds:  Chlorhexidine Gluconate Cloth  6 each Topical Daily   insulin aspart  0-9 Units Subcutaneous Q4H   mirtazapine  7.5 mg Oral QHS   mouth rinse  15 mL Mouth Rinse 4 times per day   pantoprazole (PROTONIX) IV  40 mg Intravenous Q24H   Continuous Infusions:  sodium chloride Stopped (02/06/22 1643)   dextrose 75  mL/hr at 02/08/22 1320          Evadne Ose, MD [facility] 02/09/2022, 8:08 AM

## 2022-02-09 NOTE — TOC Progression Note (Signed)
Transition of Care [facility]) - Progression Note    Patient Details  Name: Robert Beltran MRN: [id number] Date of Birth: [birth year]  [facility] CM/SW Contact  Ross Ludwig, [place] Phone Number: 02/09/2022, 4:10 PM  Clinical Narrative:     Patient is from [facility] SN[facility] under short term rehab benefits.  CSW spoke to [facility] and they said patient can return once medically ready, but insurance authorization will have to be started again.  CSW sent updated clinicals to [facility], CSW to continue to follow throughout discharge planning.   Expected Discharge Plan: [place] Barriers to Discharge: Continued Medical Work up  Expected Discharge Plan and Services Expected Discharge Plan: [place]   Discharge Planning Services: CM Consult Post Acute Care Choice: [place] Living arrangements for the past 2 months: [place]                                       Social Determinants of Health (SDOH) Interventions    Readmission Risk Interventions     No data to display

## 2022-02-09 NOTE — Progress Notes (Signed)
    OVERNIGHT PROGRESS REPORT  Notified by RN for concern over PO intake. Patient exhibited a "wet cough" with attempts to intake.  NPO for now.    Gershon Cull MSNA MSN ACNPC-AG Acute Care Nurse Practitioner [place]

## 2022-02-10 DIAGNOSIS — G9341 Metabolic encephalopathy: Secondary | ICD-10-CM | POA: Diagnosis not present

## 2022-02-10 DIAGNOSIS — J690 Pneumonitis due to inhalation of food and vomit: Secondary | ICD-10-CM

## 2022-02-10 DIAGNOSIS — R627 Adult failure to thrive: Secondary | ICD-10-CM

## 2022-02-10 DIAGNOSIS — J9601 Acute respiratory failure with hypoxia: Secondary | ICD-10-CM

## 2022-02-10 DIAGNOSIS — K922 Gastrointestinal hemorrhage, unspecified: Secondary | ICD-10-CM | POA: Diagnosis not present

## 2022-02-10 DIAGNOSIS — R1310 Dysphagia, unspecified: Secondary | ICD-10-CM

## 2022-02-10 LAB — CBC WITH DIFFERENTIAL/PLATELET
Abs Immature Granulocytes: 0.2 10*3/uL — ABNORMAL HIGH (ref 0.00–0.07)
Basophils Absolute: 0 10*3/uL (ref 0.0–0.1)
Basophils Relative: 0 %
Eosinophils Absolute: 0.4 10*3/uL (ref 0.0–0.5)
Eosinophils Relative: 4 %
HCT: 30.9 % — ABNORMAL LOW (ref 39.0–52.0)
Hemoglobin: 9.8 g/dL — ABNORMAL LOW (ref 13.0–17.0)
Immature Granulocytes: 2 %
Lymphocytes Relative: 7 %
Lymphs Abs: 0.8 10*3/uL (ref 0.7–4.0)
MCH: 30.8 pg (ref 26.0–34.0)
MCHC: 31.7 g/dL (ref 30.0–36.0)
MCV: 97.2 fL (ref 80.0–100.0)
Monocytes Absolute: 0.9 10*3/uL (ref 0.1–1.0)
Monocytes Relative: 8 %
Neutro Abs: 8.8 10*3/uL — ABNORMAL HIGH (ref 1.7–7.7)
Neutrophils Relative %: 79 %
Platelets: 170 10*3/uL (ref 150–400)
RBC: 3.18 MIL/uL — ABNORMAL LOW (ref 4.22–5.81)
RDW: 14.8 % (ref 11.5–15.5)
WBC: 11.1 10*3/uL — ABNORMAL HIGH (ref 4.0–10.5)
nRBC: 0 % (ref 0.0–0.2)

## 2022-02-10 LAB — BASIC METABOLIC PANEL
Anion gap: 8 (ref 5–15)
BUN: 16 mg/dL (ref 8–23)
CO2: 31 mmol/L (ref 22–32)
Calcium: 8.3 mg/dL — ABNORMAL LOW (ref 8.9–10.3)
Chloride: 105 mmol/L (ref 98–111)
Creatinine, Ser: 1.09 mg/dL (ref 0.61–1.24)
GFR, Estimated: >60 mL/min (ref >60)
Glucose, Bld: 172 mg/dL — ABNORMAL HIGH (ref 70–99)
Potassium: 3.3 mmol/L — ABNORMAL LOW (ref 3.5–5.1)
Sodium: 144 mmol/L (ref 135–145)

## 2022-02-10 LAB — GLUCOSE, CAPILLARY
Glucose-Capillary: 100 mg/dL — ABNORMAL HIGH (ref 70–99)
Glucose-Capillary: 102 mg/dL — ABNORMAL HIGH (ref 70–99)
Glucose-Capillary: 107 mg/dL — ABNORMAL HIGH (ref 70–99)
Glucose-Capillary: 110 mg/dL — ABNORMAL HIGH (ref 70–99)
Glucose-Capillary: 115 mg/dL — ABNORMAL HIGH (ref 70–99)
Glucose-Capillary: 94 mg/dL (ref 70–99)

## 2022-02-10 LAB — MAGNESIUM: Magnesium: 1.5 mg/dL — ABNORMAL LOW (ref 1.7–2.4)

## 2022-02-10 MED ORDER — POTASSIUM CHLORIDE 10 MEQ/100ML IV SOLN
10.0000 meq | INTRAVENOUS | Status: AC
  Administered 2022-02-10 (×4): 10 meq via INTRAVENOUS
  Filled 2022-02-10: qty 100

## 2022-02-10 MED ORDER — MAGNESIUM SULFATE 2 GM/50ML IV SOLN
2.0000 g | Freq: Once | INTRAVENOUS | Status: AC
  Administered 2022-02-10: 2 g via INTRAVENOUS
  Filled 2022-02-10: qty 50

## 2022-02-10 NOTE — Plan of Care (Signed)
Pt confused.  Faces pain scale 2.  NPO diet continued.  Problem: Health Behavior/Discharge Planning: Goal: Ability to manage health-related needs will improve Outcome: Not Progressing   Problem: Activity: Goal: Risk for activity intolerance will decrease Outcome: Not Progressing   Problem: Nutrition: Goal: Adequate nutrition will be maintained Outcome: Not Progressing

## 2022-02-10 NOTE — Progress Notes (Signed)
  Progress Note   Patient: Robert Beltran DTO:[id number] DOB: 08-15-1923 DOA: 02/04/2022     6 DOS: the patient was seen and examined on 02/10/2022   Brief hospital course: 88 year old man presenting with anemia, melanotic stool, admitted by critical care to the ICU, seen by GI, underwent EGD showing nonbleeding duodenal ulcer, underwent PRBC transfusion, stabilized and transferred to [facility] 8/12.  Issues include acute metabolic encephalopathy, delirium, failure to thrive, dysphagia.  Assessment and Plan: Upper GI bleeding/nonbleeding duodenal ulcer, ABLA --Status post 3 units PRBC and 1 unit FFP transfusion during the hospitalization. Hgb stable. --GI has signed off.  s/p EGD 8/11, showed nonbleeding duodenal ulcer with clean ulcer base.  GI recommended to continue PPI daily indefinitely and to hold anticoagulation for another 2 to 4 weeks if possible.   Acute metabolic encephalopathy/delirium, possible undiagnosed underlying dementia --Full code per son, appreciate palliative care involvement. --Seen by psychiatry, continue Haldo as needed.   Failure to thrive, dysphagia, aspiration pneumonia, with associated acute hypoxic respiratory failure -- Limited diet as per speech therapy.  Wean oxygen as tolerated.  Continue empiric antibiotics. -- Continue ongoing goals of care.  Prognosis guarded.  Acute kidney injury, Baseline creatinine of 0.9-1.01.   --resolved. Renal ultrasound showed mild left-sided hydronephrosis with no visible stones, layering debris in bladder which could be infectious versus hemorrhagic.  --Consider outpatient follow-up with urology   Hypokalemia, hypomagnesemia --replete   PMH pulmonary emboli --History of subsegmental PE in 10/2021. --No need for further anticoagulation for this PE as per pulmonary.   Diabetes mellitus type 2 with hypoglycemia --stable. Continue CBGs with SSI   Chronic diastolic heart failure --Currently compensated.    RESOLVED Thrombocytopenia Hypernatremia Lactic acidosis     Subjective:  Late entry Feels ok  Physical Exam: Vitals:   02/09/22 2009 02/10/22 0401 02/10/22 1438 02/10/22 2018  BP: (!) 141/68 (!) 133/43 (!) 145/92 (!) 155/56  Pulse: 79 69 82 88  Resp: '18 16 20 17  '$ Temp: 98.5 F (36.9 C) 98.3 F (36.8 C) 98 F (36.7 C) 98.2 F (36.8 C)  TempSrc: Oral Oral  Oral  SpO2: 97% 98% 90% 91%  Weight:      Height:       Physical Exam Vitals reviewed.  Constitutional:      General: He is not in acute distress.    Appearance: He is not ill-appearing or toxic-appearing.     Comments: Appears younger than stated age  Cardiovascular:     Rate and Rhythm: Normal rate and regular rhythm.     Heart sounds: No murmur heard. Pulmonary:     Effort: Pulmonary effort is normal. No respiratory distress.     Breath sounds: No wheezing, rhonchi or rales.  Neurological:     Mental Status: He is alert.  Psychiatric:        Mood and Affect: Mood normal.        Behavior: Behavior normal.     Data Reviewed:  CBG stable K+ 3.3 Mg 1.5 Hgb stable 9.8  Family Communication: none present  Disposition: Status is: Inpatient Remains inpatient appropriate because: aspiration  Planned Discharge Destination:  TBD    Time spent: 25 minutes  Author: Murray Hodgkins, MD 02/10/2022 9:09 PM  For on call review [email or web address].

## 2022-02-10 NOTE — Hospital Course (Addendum)
88 year old man presenting with anemia, melanotic stool, admitted by critical care to the ICU, seen by GI, underwent EGD showing nonbleeding duodenal ulcer, underwent PRBC transfusion, stabilized and transferred to [facility] 8/12.  Hospitalization complicated by persistent acute metabolic encephalopathy, aspiration pneumonia, acute on chronic hypoxic respiratory failure, delirium, failure to thrive, dysphagia, developing AKI.  Patient failed to respond to treatment, remained encephalopathic and developed acute kidney injury despite treatment.  In discussion with son/healthcare power of attorney, patient was made DNR and subsequently comfort care.  If stabilizes, anticipate transfer to hospice.  If declines, inpatient death anticipated.

## 2022-02-11 DIAGNOSIS — K922 Gastrointestinal hemorrhage, unspecified: Secondary | ICD-10-CM | POA: Diagnosis not present

## 2022-02-11 DIAGNOSIS — J9601 Acute respiratory failure with hypoxia: Secondary | ICD-10-CM | POA: Diagnosis not present

## 2022-02-11 DIAGNOSIS — J690 Pneumonitis due to inhalation of food and vomit: Secondary | ICD-10-CM | POA: Diagnosis not present

## 2022-02-11 DIAGNOSIS — G9341 Metabolic encephalopathy: Secondary | ICD-10-CM | POA: Diagnosis not present

## 2022-02-11 DIAGNOSIS — J9621 Acute and chronic respiratory failure with hypoxia: Secondary | ICD-10-CM

## 2022-02-11 LAB — GLUCOSE, CAPILLARY
Glucose-Capillary: 157 mg/dL — ABNORMAL HIGH (ref 70–99)
Glucose-Capillary: 109 mg/dL — ABNORMAL HIGH (ref 70–99)
Glucose-Capillary: 110 mg/dL — ABNORMAL HIGH (ref 70–99)

## 2022-02-11 LAB — BASIC METABOLIC PANEL
Anion gap: 7 (ref 5–15)
BUN: 14 mg/dL (ref 8–23)
CO2: 31 mmol/L (ref 22–32)
Calcium: 8.6 mg/dL — ABNORMAL LOW (ref 8.9–10.3)
Chloride: 106 mmol/L (ref 98–111)
Creatinine, Ser: 1.13 mg/dL (ref 0.61–1.24)
GFR, Estimated: 60 mL/min — ABNORMAL LOW (ref >60)
Glucose, Bld: 128 mg/dL — ABNORMAL HIGH (ref 70–99)
Potassium: 3.7 mmol/L (ref 3.5–5.1)
Sodium: 144 mmol/L (ref 135–145)

## 2022-02-11 LAB — MAGNESIUM: Magnesium: 2.1 mg/dL (ref 1.7–2.4)

## 2022-02-11 LAB — PHOSPHORUS: Phosphorus: 3.7 mg/dL (ref 2.5–4.6)

## 2022-02-11 MED ORDER — ESCITALOPRAM OXALATE 5 MG PO TABS
2.5000 mg | ORAL_TABLET | Freq: Every day | ORAL | Status: DC
  Administered 2022-02-12 – 2022-02-15 (×4): 2.5 mg via ORAL
  Filled 2022-02-11 (×4): qty 1

## 2022-02-11 NOTE — Progress Notes (Signed)
Physical Therapy Treatment Patient Details Name: Robert Beltran MRN: [id number] DOB: [birth year] Today's Date: 02/11/2022   History of Present Illness 88 year old male with history of BPH, diabetes mellitus type 2, hypertension, recent admission in 10/2021 for pneumonia and pulmonary embolism for which he was started on oral Eliquis presented 02/04/22 with decreased activity/oral intake and dark bowel movements.  On presentation, hemoglobin was 6. He underwent EGD on 02/05/2022 which showed nonbleeding duodenal ulcer. Patient  from [facility]  SNF.    PT Comments    General Comments: agitated from recent bath and bed change.  uncooperative.  Resistant.  Oriented to self only.  Talking about people moving their cars and a bunch of kids who are bothering him.  Assisted to EOB only.  General bed mobility comments: pt resistant despite calm efforts and + 2 assist.  Only able to transition to EOB with Max/Total Assist.  Poor sitting posture, forward flexed, collapsed spine.  Unable to self midline and quick to fatgue.  Limited activty tolerance.  Profound weakness. General transfer comment: unable to attempt due to level of agitation/resistants/confusion. Pt's voice is gurgled.  "Something in my throat". Pt present with WOB. Currently on 3 lts with sats low 80's. Reported to RN    Recommendations for follow up therapy are one component of a multi-disciplinary discharge planning process, led by the attending physician.  Recommendations may be updated based on patient status, additional functional criteria and insurance authorization.  Follow Up Recommendations  Skilled nursing-short term rehab (<3 hours/day) Can patient physically be transported by private vehicle: No   Assistance Recommended at Discharge Frequent or constant Supervision/Assistance  Patient can return home with the following Two people to help with walking and/or transfers;A lot of help with bathing/dressing/bathroom;Help with stairs or  ramp for entrance;Assist for transportation   Equipment Recommendations  None recommended by PT    Recommendations for Other Services       Precautions / Restrictions Precautions Precautions: Fall Restrictions Weight Bearing Restrictions: No     Mobility  Bed Mobility Overal bed mobility: Needs Assistance Bed Mobility: Rolling, Supine to Sit, Sit to Supine Rolling: Max assist, Total assist, +2 for physical assistance, +2 for safety/equipment   Supine to sit: Max assist, Total assist, +2 for physical assistance, +2 for safety/equipment Sit to supine: Total assist, +2 for physical assistance, +2 for safety/equipment   General bed mobility comments: pt resistant despite calm efforts and + 2 assist.  Only able to transition to EOB with Max/Total Assist.  Poor sitting posture, forward flexed, collapsed spine.  Unable to self midline and quick to fatgue.  Limited activty tolerance.  Profound weakness.    Transfers                   General transfer comment: unable to attempt due to level of agitation/resistants/confusion.    Ambulation/Gait                   Stairs             Wheelchair Mobility    Modified Rankin (Stroke Patients Only)       Balance                                            Cognition Arousal/Alertness: Awake/alert Behavior During Therapy: Agitated  General Comments: agitated from recent bath and bed change.  uncooperative.  Resistant.  Oriented to self only.  Talking about people moving their cars and a bunch of kids who are bothering him.        Exercises      General Comments        Pertinent Vitals/Pain Pain Assessment Pain Assessment: No/denies pain    Home Living                          Prior Function            PT Goals (current goals can now be found in the care plan section) Progress towards PT goals: Progressing toward  goals    Frequency    Min 2X/week      PT Plan Current plan remains appropriate    Co-evaluation              AM-PAC PT "6 Clicks" Mobility   Outcome Measure  Help needed turning from your back to your side while in a flat bed without using bedrails?: Total Help needed moving from lying on your back to sitting on the side of a flat bed without using bedrails?: Total Help needed moving to and from a bed to a chair (including a wheelchair)?: Total Help needed standing up from a chair using your arms (e.g., wheelchair or bedside chair)?: Total Help needed to walk in hospital room?: Total Help needed climbing 3-5 steps with a railing? : Total 6 Click Score: 6    End of Session Equipment Utilized During Treatment: Gait belt Activity Tolerance: Patient limited by fatigue Patient left: in bed;with call bell/phone within reach;with bed alarm set Nurse Communication: Mobility status;Need for lift equipment PT Visit Diagnosis: Muscle weakness (generalized) (M62.81);Difficulty in walking, not elsewhere classified (R26.2)     Time: 5320-2334 PT Time Calculation (min) (ACUTE ONLY): 15 min  Charges:  $Therapeutic Activity: 8-22 mins                     Rica Koyanagi  PTA Acute  Rehabilitation Services Office M-F          [phone] Weekend pager [phone]

## 2022-02-11 NOTE — Care Management Important Message (Signed)
Important Message  Patient Details IM Letter placed in Patients room. Name: Robert Beltran MRN: [id number] Date of Birth: 11-08-1923   Medicare Important Message Given:  Yes     Kerin Salen 02/11/2022, 10:36 AM

## 2022-02-11 NOTE — Progress Notes (Signed)
Palliative:  I went to Mr. Prokop bedside but no family present. Noted long conversation with Dr. Sarajane Jews earlier today. I will give family some time to process information and options with plans to follow up tomorrow to continue conversation and support.   No charge  Vinie Sill, NP Palliative Medicine Team Pager [phone] (Please see [email or web address] for schedule) Team Phone [phone]

## 2022-02-11 NOTE — Progress Notes (Signed)
SLP Cancellation Note  Patient Details Name: YAEL ANGERER MRN: [id number] DOB: [birth year]   Cancelled treatment:       Reason Eval/Treat Not Completed: Other (comment) (RN reports pt is lethargic and not managing his secretions, will continue efforts, palliative care referral pending)  [name], MS [place] Office [phone] Pager [phone]  [name] 02/11/2022, 1:51 PM

## 2022-02-11 NOTE — Progress Notes (Signed)
Progress Note   Patient: Robert Beltran IFO:[id number] DOB: 22-Nov-1923 DOA: 02/04/2022     7 DOS: the patient was seen and examined on 02/11/2022   Brief hospital course: 88 year old man presenting with anemia, melanotic stool, admitted by critical care to the ICU, seen by GI, underwent EGD showing nonbleeding duodenal ulcer, underwent PRBC transfusion, stabilized and transferred to [facility] 8/12.  Issues include acute metabolic encephalopathy, aspiration pneumonia, acute on chronic hypoxic respiratory failure, delirium, failure to thrive, dysphagia.  Assessment and Plan: Upper GI bleeding/nonbleeding duodenal ulcer, ABLA --Status post 3 units PRBC and 1 unit FFP transfusion during the hospitalization. Hgb stable. --GI signed off.  s/p EGD 8/11, showed nonbleeding duodenal ulcer with clean ulcer base.  GI recommended to continue PPI daily indefinitely and to hold anticoagulation for another 2 to 4 weeks if possible.    Acute metabolic encephalopathy/delirium, very mild dementia suspected.  Patient on Aricept prior to admission.  Son reports patient slightly confused (thought he had 2 sons) --Seen by psychiatry, continue Haldol as needed.  --Secondary to multiple acute issues.   Failure to thrive, dysphagia, aspiration pneumonia, with associated acute on chronic hypoxic respiratory failure. -- Has been on oxygen since pulmonary embolism in May.  Since that hospitalization in May the patient went from independent living to SNF where he continued to decline, has been wheelchair-bound but spent most of his time in bed in his room with interaction primarily with nurses and his son.   --Limited diet as per speech therapy.  However not currently awake enough to take p.o. --Continue oxygen, antibiotics, aspiration per cautions. -- See goals of care below.  Prognosis guarded.   Acute kidney injury, Baseline creatinine of 0.9-1.01.   --resolved. Renal ultrasound showed mild left-sided  hydronephrosis with no visible stones, layering debris in bladder which could be infectious versus hemorrhagic.  --Consider outpatient follow-up with urology   Hypokalemia, hypomagnesemia --replete   PMH pulmonary emboli --History of subsegmental PE in 10/2021. --No need for further anticoagulation for this PE as per pulmonary.   Diabetes mellitus type 2 with hypoglycemia --stable. Continue CBGs with SSI   Chronic diastolic heart failure --Currently compensated.   RESOLVED Thrombocytopenia Hypernatremia Lactic acidosis  Patient appears critically ill, prognosis guarded, may not survive this hospitalization.  Continue antibiotics, supplemental oxygen.  He is currently unable to manage secretions or swallow.  He has not been able to take anything orally today.       Subjective:  Confused, difficult to understand Per RN: Managing secretions poorly, not alert enough to take medications or swallow.  Physical Exam: Vitals:   02/10/22 0401 02/10/22 1438 02/10/22 2018 02/11/22 1438  BP: (!) 133/43 (!) 145/92 (!) 155/56 (!) 141/85  Pulse: 69 82 88 (!) 105  Resp: '16 20 17 18  '$ Temp: 98.3 F (36.8 C) 98 F (36.7 C) 98.2 F (36.8 C)   TempSrc: Oral  Oral   SpO2: 98% 90% 91% (!) 82%  Weight:      Height:       Physical Exam Vitals reviewed.  Constitutional:      General: He is not in acute distress.    Appearance: He is not ill-appearing or toxic-appearing.  Cardiovascular:     Rate and Rhythm: Normal rate and regular rhythm.     Heart sounds: No murmur heard. Pulmonary:     Effort: No respiratory distress.     Breath sounds: No wheezing, rhonchi or rales.     Comments: Mild increased resp effort,  audible secretions Neurological:     Mental Status: He is alert.  Psychiatric:     Comments: Confused, speech audible, only 20% intelligible       Data Reviewed:  BMP stable  Family Communication: son Raghav at bedside  Disposition: Status is: Inpatient Remains  inpatient appropriate because: Hypoxic respiratory failure, failure to thrive, inability to tolerate secretions  Planned Discharge Destination:  TBD    Time spent: 35 minutes  Author: Murray Hodgkins, MD 02/11/2022 3:39 PM  For on call review [email or web address].

## 2022-02-11 NOTE — Progress Notes (Signed)
PHARMACY NOTE -  Unasyn  Pharmacy has been assisting with dosing of Unasyn for aspiration PNA. Dosage remains stable at 3 g IV q6 hr and further renal adjustments per institutional Pharmacy antibiotic protocol  Pharmacy will sign off, following peripherally for culture results or dose adjustments. Please reconsult if a change in clinical status warrants re-evaluation of dosage.  Reuel Boom, PharmD, BCPS [phone] 02/11/2022, 12:13 PM

## 2022-02-11 NOTE — IPAL (Addendum)
  Interdisciplinary Goals of Care Family Meeting   Date carried out: 02/11/2022  Location of the meeting: Bedside  Member's involved: Physician and Family Member or next of kin   Durable Power of Attorney or acting medical decision maker: son/healthcare power of attorney Espiridion Supinski    Discussion: Reviewed current circumstances, clinical condition and challenges including persistent metabolic encephalopathy, delirium, acute on chronic hypoxic respiratory failure, aspiration pneumonia ; now with inability to handle secretions and failure to thrive, poor oral intake, inability to take medications today.  Discussed guarded prognosis and possibility patient may not survive this hospitalization.  Discussed the patient's decline since May of this year from independent living to SNF, wheelchair and bedbound with severe decline and social interactions.  We discussed goals of care for Domenick Bookbinder .  We discussed treatment options including current care, as well as the concept of hospice.  We discussed CODE STATUS and in detail the process of CPR.  I recommended DNR status.  Son was receptive and is considering options.  Code status: Full Code at this time the patient remains full code, continue current scope of care.  Not a candidate for BiPAP given inability to manage secretions.  Disposition: Continue current acute care  Time spent for the meeting: 35 minutes    Murray Hodgkins, MD  02/11/2022, 3:44 PM

## 2022-02-12 DIAGNOSIS — K922 Gastrointestinal hemorrhage, unspecified: Secondary | ICD-10-CM | POA: Diagnosis not present

## 2022-02-12 DIAGNOSIS — G9341 Metabolic encephalopathy: Secondary | ICD-10-CM | POA: Diagnosis not present

## 2022-02-12 DIAGNOSIS — R410 Disorientation, unspecified: Secondary | ICD-10-CM | POA: Diagnosis not present

## 2022-02-12 DIAGNOSIS — J690 Pneumonitis due to inhalation of food and vomit: Secondary | ICD-10-CM | POA: Diagnosis not present

## 2022-02-12 LAB — GLUCOSE, CAPILLARY
Glucose-Capillary: 121 mg/dL — ABNORMAL HIGH (ref 70–99)
Glucose-Capillary: 111 mg/dL — ABNORMAL HIGH (ref 70–99)
Glucose-Capillary: 108 mg/dL — ABNORMAL HIGH (ref 70–99)
Glucose-Capillary: 124 mg/dL — ABNORMAL HIGH (ref 70–99)
Glucose-Capillary: 99 mg/dL (ref 70–99)

## 2022-02-12 MED ORDER — DEXTROSE IN LACTATED RINGERS 5 % IV SOLN: INTRAVENOUS | Status: DC

## 2022-02-12 NOTE — Progress Notes (Signed)
Progress Note   Patient: Robert Beltran QPY:[id number] DOB: [birth year] DOA: 02/04/2022     8 DOS: the patient was seen and examined on 02/12/2022   Brief hospital course: 88 year old man presenting with anemia, melanotic stool, admitted by critical care to the ICU, seen by GI, underwent EGD showing nonbleeding duodenal ulcer, underwent PRBC transfusion, stabilized and transferred to [facility] 8/12.  Issues include acute metabolic encephalopathy, aspiration pneumonia, acute on chronic hypoxic respiratory failure, delirium, failure to thrive, dysphagia.  Assessment and Plan: Upper GI bleeding/nonbleeding duodenal ulcer, ABLA --Status post 3 units PRBC and 1 unit FFP transfusion during the hospitalization. Hgb stable. --GI signed off.  s/p EGD 8/11, showed nonbleeding duodenal ulcer with clean ulcer base.  GI recommended to continue PPI daily indefinitely and to hold anticoagulation for another 2 to 4 weeks if possible.    Acute metabolic encephalopathy/delirium, very mild dementia suspected.  Patient on Aricept prior to admission.  Son reports patient slightly confused (thought he had 2 sons) --Seen by psychiatry, continue Haldol as needed.  --Secondary to multiple acute issues. --No apparent changes.   Failure to thrive, dysphagia, aspiration pneumonia, with associated acute on chronic hypoxic respiratory failure. -- Has been on oxygen since pulmonary embolism in May.  Since that hospitalization in May the patient went from independent living to SNF where he continued to decline, has been wheelchair-bound but spent most of his time in bed in his room with interaction primarily with nurses and his son.   --Limited diet as per speech therapy.  No intake over the last 48 hours at least secondary to mental status and inability to take medications or nutrition safely. --Continue oxygen, antibiotics, aspiration precautions -- Goals of care update documented separately. DNR.  Continue current  treatment including fluids and antibiotics.   Acute kidney injury, Baseline creatinine of 0.9-1.01.   --resolved. Renal ultrasound showed mild left-sided hydronephrosis with no visible stones, layering debris in bladder which could be infectious versus hemorrhagic.  --Consider outpatient follow-up with urology   Hypokalemia, hypomagnesemia --repleted; check BMP, Mg2+ in AM   PMH pulmonary emboli --History of subsegmental PE in 10/2021. --No need for further anticoagulation for this PE as per pulmonary.   Diabetes mellitus type 2 with hypoglycemia --stable. Continue CBGs with SSI   Chronic diastolic heart failure --Currently compensated.   RESOLVED Thrombocytopenia Hypernatremia      Subjective:  Sleeping Not awake enough to take meds or attempt eating  Physical Exam: Vitals:   02/11/22 2008 02/11/22 2145 02/12/22 0649 02/12/22 0700  BP: (!) 128/49 (!) 142/58 (!) 136/56   Pulse: 78 83 77   Resp: (!) 22 (!) 24 (!) 22   Temp: 98.2 F (36.8 C) 98 F (36.7 C) 97.8 F (36.6 C)   TempSrc: Oral Oral Oral   SpO2: 95% 95% (!) 89%   Weight:    72.5 kg  Height:       Physical Exam Vitals reviewed.  Constitutional:      General: He is not in acute distress.    Appearance: He is not ill-appearing or toxic-appearing.  Cardiovascular:     Rate and Rhythm: Normal rate and regular rhythm.     Heart sounds: No murmur heard. Pulmonary:     Effort: Pulmonary effort is normal. No respiratory distress.     Breath sounds: No wheezing, rhonchi or rales.  Neurological:     Mental Status: He is alert.  Psychiatric:     Comments: sleeping     Data  Reviewed:  CBG stable  Family Communication: son and friend [name] in person/conference room  Disposition: Status is: Inpatient Remains inpatient appropriate because: critically ill  Planned Discharge Destination:  TBD    Time spent: 20 minutes  Author: Murray Hodgkins, MD 02/12/2022 12:59 PM  For on call review  [email or web address].

## 2022-02-12 NOTE — Progress Notes (Signed)
Palliative:  Discussed with Dr. Sarajane Jews who is having good discussions with family. No palliative needs at this time. Will sign off per our discussion.   No charge  Vinie Sill, NP Palliative Medicine Team Pager [phone] (Please see [email or web address] for schedule) Team Phone [phone]

## 2022-02-12 NOTE — IPAL (Signed)
  Interdisciplinary Goals of Care Family Meeting   Date carried out: 02/12/2022  Location of the meeting: Conference room  Member's involved: Physician and Family Member or next of kin  Durable Power of Attorney or acting medical decision maker: son Elion Hocker    Discussion: We discussed goals of care for Robert Beltran .  We reviewed trajectory over the last several months, current hospitalization and current status and challenges. We discussed code status and treatment options. At this point pt has FTT, unable to take meds and is not eating. Son elected to make DNR. We discussed options to continue treatment vs. Comfort care. Son is weighing these options and will update me with any changes.  Close friend [name] present for discussion and offered son support.  Code status: Full DNR  Disposition: Continue current acute care  Time spent for the meeting: 69 minutes    Murray Hodgkins, MD  02/12/2022, 12:56 PM

## 2022-02-12 NOTE — TOC Progression Note (Signed)
Transition of Care [facility]) - Progression Note    Patient Details  Name: Robert Beltran MRN: [id number] Date of Birth: 1923-11-05  [facility] of [facility] CM/SW Contact  Purcell Mouton, RN Phone Number: 02/12/2022, 11:30 AM  Clinical Narrative:    [facility] called to check on pt's status for discharge. Pt may return today. If discharging on Saturday, facility will need to know in advance.    Expected Discharge Plan: [place] Barriers to Discharge: Continued Medical Work up  Expected Discharge Plan and Services Expected Discharge Plan: [place]   Discharge Planning Services: CM Consult Post Acute Care Choice: [place] Living arrangements for the past 2 months: [place]                                       Social Determinants of Health (SDOH) Interventions    Readmission Risk Interventions     No data to display

## 2022-02-12 NOTE — Plan of Care (Signed)
Pt increasingly drowsy.  Refusing VS and BG checks intermittently.  Problem: Activity: Goal: Risk for activity intolerance will decrease Outcome: Not Progressing   Problem: Nutrition: Goal: Adequate nutrition will be maintained Outcome: Not Progressing

## 2022-02-13 DIAGNOSIS — E119 Type 2 diabetes mellitus without complications: Secondary | ICD-10-CM | POA: Diagnosis not present

## 2022-02-13 DIAGNOSIS — G9341 Metabolic encephalopathy: Secondary | ICD-10-CM | POA: Diagnosis not present

## 2022-02-13 DIAGNOSIS — N179 Acute kidney failure, unspecified: Secondary | ICD-10-CM | POA: Diagnosis not present

## 2022-02-13 DIAGNOSIS — J690 Pneumonitis due to inhalation of food and vomit: Secondary | ICD-10-CM | POA: Diagnosis not present

## 2022-02-13 LAB — BASIC METABOLIC PANEL
Anion gap: 9 (ref 5–15)
BUN: 23 mg/dL (ref 8–23)
CO2: 28 mmol/L (ref 22–32)
Calcium: 8.1 mg/dL — ABNORMAL LOW (ref 8.9–10.3)
Chloride: 102 mmol/L (ref 98–111)
Creatinine, Ser: 2 mg/dL — ABNORMAL HIGH (ref 0.61–1.24)
GFR, Estimated: 30 mL/min — ABNORMAL LOW (ref >60)
Glucose, Bld: 94 mg/dL (ref 70–99)
Potassium: 3.5 mmol/L (ref 3.5–5.1)
Sodium: 139 mmol/L (ref 135–145)

## 2022-02-13 LAB — GLUCOSE, CAPILLARY
Glucose-Capillary: 108 mg/dL — ABNORMAL HIGH (ref 70–99)
Glucose-Capillary: 115 mg/dL — ABNORMAL HIGH (ref 70–99)
Glucose-Capillary: 140 mg/dL — ABNORMAL HIGH (ref 70–99)
Glucose-Capillary: 143 mg/dL — ABNORMAL HIGH (ref 70–99)
Glucose-Capillary: 73 mg/dL (ref 70–99)
Glucose-Capillary: 87 mg/dL (ref 70–99)

## 2022-02-13 LAB — MAGNESIUM: Magnesium: 1.9 mg/dL (ref 1.7–2.4)

## 2022-02-13 LAB — PHOSPHORUS: Phosphorus: 5.6 mg/dL — ABNORMAL HIGH (ref 2.5–4.6)

## 2022-02-13 NOTE — Progress Notes (Signed)
Progress Note   Patient: Robert Beltran OVF:[id number] DOB: Jan 13, 1924 DOA: 02/04/2022     9 DOS: the patient was seen and examined on 02/13/2022   Brief hospital course: 88 year old man presenting with anemia, melanotic stool, admitted by critical care to the ICU, seen by GI, underwent EGD showing nonbleeding duodenal ulcer, underwent PRBC transfusion, stabilized and transferred to [facility] 8/12.  Issues include acute metabolic encephalopathy, aspiration pneumonia, acute on chronic hypoxic respiratory failure, delirium, failure to thrive, dysphagia.  Assessment and Plan: Upper GI bleeding/nonbleeding duodenal ulcer, ABLA --Status post 3 units PRBC and 1 unit FFP transfusion during the hospitalization. Hgb stable. --GI signed off.  s/p EGD 8/11, showed nonbleeding duodenal ulcer with clean ulcer base.  GI recommended to continue PPI daily indefinitely and to hold anticoagulation for another 2 to 4 weeks if possible.    Acute metabolic encephalopathy/delirium, very mild dementia suspected.  Patient on Aricept prior to admission.  Son reports patient slightly confused (thought he had 2 sons) --Seen by psychiatry, continue Haldol as needed.  --Secondary to multiple acute issues. -- More awake today.  Still confused.   Failure to thrive, dysphagia, aspiration pneumonia, with associated acute on chronic hypoxic respiratory failure. -- Has been on oxygen since pulmonary embolism in May.  Since that hospitalization in May the patient went from independent living to SNF where he continued to decline, has been wheelchair-bound but spent most of his time in bed in his room with interaction primarily with nurses and his son.   --Limited diet as per speech therapy.  No significant intake over the last 72 hours at least secondary to mental status and inability to take medications or nutrition safely. --Continue oxygen, antibiotics, aspiration precautions -- Continue current care    Acute kidney  injury, Baseline creatinine of 0.9-1.01.   --Renal ultrasound showed mild left-sided hydronephrosis with no visible stones, layering debris in bladder which could be infectious versus hemorrhagic.  -- Recurrent today.  Etiology unclear.  Continue fluids.  Recheck BMP in AM.   Hypokalemia, hypomagnesemia -- Magnesium within normal limits.  Potassium 3.5.  Creatinine up to 2.0.   PMH pulmonary emboli --History of subsegmental PE in 10/2021. --No need for further anticoagulation for this PE as per pulmonary.   Diabetes mellitus type 2 with hypoglycemia --stable. Continue CBGs with SSI.  Continue dextrose infusion.   Chronic diastolic heart failure --Currently compensated.   RESOLVED Thrombocytopenia Hypernatremia  Overall appears about the same, worsening kidney function is of concern, likelihood of recovery well, continue present treatment as per goals of care but recommend comfort care unless dramatic improvement in the next 24 hours.  Recovering as expected.  May die within the hospital.     Subjective:  Awake Seems to feel ok History unreliable  Physical Exam: Vitals:   02/12/22 1415 02/12/22 2256 02/13/22 0636 02/13/22 0700  BP: (!) 138/54 (!) 122/55 (!) 119/56   Pulse: 74 73 73   Resp: '14 20 16   '$ Temp: (!) 97.5 F (36.4 C) 98.4 F (36.9 C) 98.1 F (36.7 C)   TempSrc: Oral Oral Oral   SpO2:  (!) 88% (!) 87%   Weight:    71.3 kg  Height:       Physical Exam Vitals reviewed.  Constitutional:      General: He is not in acute distress.    Appearance: He is not ill-appearing or toxic-appearing.  Cardiovascular:     Rate and Rhythm: Normal rate and regular rhythm.  Heart sounds: No murmur heard. Pulmonary:     Effort: Pulmonary effort is normal. No respiratory distress.     Breath sounds: No wheezing, rhonchi or rales.  Abdominal:     Tenderness: There is no abdominal tenderness.  Musculoskeletal:     Right lower leg: No edema.     Left lower leg: No edema.   Neurological:     Mental Status: He is alert. He is disoriented.  Psychiatric:     Comments: Awake and alert, tries to answer questions, speech is fluent but difficult to understand     Data Reviewed:  CBG stable Creatinine up to 2.00 UOP down last 24 hours (400)  Family Communication: none present, will discuss with son when able  Disposition: Status is: Inpatient Remains inpatient appropriate because: guarded prognosis  Planned Discharge Destination:  TBD    Time spent: 35 minutes  Author: Murray Hodgkins, MD 02/13/2022 10:25 AM  For on call review [email or web address].

## 2022-02-14 ENCOUNTER — Inpatient Hospital Stay (HOSPITAL_COMMUNITY): Payer: Medicare Other

## 2022-02-14 DIAGNOSIS — J9601 Acute respiratory failure with hypoxia: Secondary | ICD-10-CM | POA: Diagnosis not present

## 2022-02-14 DIAGNOSIS — K922 Gastrointestinal hemorrhage, unspecified: Secondary | ICD-10-CM | POA: Diagnosis not present

## 2022-02-14 DIAGNOSIS — J690 Pneumonitis due to inhalation of food and vomit: Secondary | ICD-10-CM | POA: Diagnosis not present

## 2022-02-14 DIAGNOSIS — G9341 Metabolic encephalopathy: Secondary | ICD-10-CM | POA: Diagnosis not present

## 2022-02-14 LAB — URINALYSIS, ROUTINE W REFLEX MICROSCOPIC
Bilirubin Urine: NEGATIVE
Glucose, UA: NEGATIVE mg/dL
Hgb urine dipstick: NEGATIVE
Ketones, ur: 5 mg/dL — AB
Leukocytes,Ua: NEGATIVE
Nitrite: NEGATIVE
Protein, ur: 30 mg/dL — AB
Specific Gravity, Urine: 1.021 (ref 1.005–1.030)
pH: 5 (ref 5.0–8.0)

## 2022-02-14 LAB — BASIC METABOLIC PANEL
Anion gap: 8 (ref 5–15)
BUN: 26 mg/dL — ABNORMAL HIGH (ref 8–23)
CO2: 27 mmol/L (ref 22–32)
Calcium: 8.3 mg/dL — ABNORMAL LOW (ref 8.9–10.3)
Chloride: 108 mmol/L (ref 98–111)
Creatinine, Ser: 2.33 mg/dL — ABNORMAL HIGH (ref 0.61–1.24)
GFR, Estimated: 25 mL/min — ABNORMAL LOW (ref >60)
Glucose, Bld: 90 mg/dL (ref 70–99)
Potassium: 4.1 mmol/L (ref 3.5–5.1)
Sodium: 143 mmol/L (ref 135–145)

## 2022-02-14 LAB — GLUCOSE, CAPILLARY
Glucose-Capillary: 100 mg/dL — ABNORMAL HIGH (ref 70–99)
Glucose-Capillary: 88 mg/dL (ref 70–99)
Glucose-Capillary: 81 mg/dL (ref 70–99)
Glucose-Capillary: 115 mg/dL — ABNORMAL HIGH (ref 70–99)
Glucose-Capillary: 169 mg/dL — ABNORMAL HIGH (ref 70–99)

## 2022-02-14 MED ORDER — SODIUM CHLORIDE 0.9 % IV SOLN
3.0000 g | Freq: Two times a day (BID) | INTRAVENOUS | Status: DC
  Administered 2022-02-14 – 2022-02-15 (×2): 3 g via INTRAVENOUS
  Filled 2022-02-14 (×2): qty 8

## 2022-02-14 NOTE — Progress Notes (Signed)
PHARMACY NOTE:  ANTIMICROBIAL RENAL DOSAGE ADJUSTMENT  Current antimicrobial regimen includes a mismatch between antimicrobial dosage and estimated renal function.  As per policy approved by the Pharmacy & Therapeutics and Medical Executive Committees, the antimicrobial dosage will be adjusted accordingly.  Current antimicrobial dosage:  Unasyn 3g IV q6h  Indication: Aspriation PNA  Renal Function:  Estimated Creatinine Clearance: 18.6 mL/min (A) (by C-G formula based on SCr of 2.33 mg/dL (H)). '[]'$      On intermittent HD, scheduled: '[]'$      On CRRT    Antimicrobial dosage has been changed to:  3g IV q12h  Additional comments:   Thank you for allowing pharmacy to be a part of this patient's care.  Peggyann Juba, PharmD, BCPS Pharmacy: [phone] 02/14/2022 11:19 AM

## 2022-02-14 NOTE — Progress Notes (Addendum)
Progress Note   Patient: Robert Beltran SFK:[id number] DOB: 1923-10-15 DOA: 02/04/2022     10 DOS: the patient was seen and examined on 02/14/2022   Brief hospital course: 88 year old man presenting with anemia, melanotic stool, admitted by critical care to the ICU, seen by GI, underwent EGD showing nonbleeding duodenal ulcer, underwent PRBC transfusion, stabilized and transferred to [facility] 8/12.  Issues include acute metabolic encephalopathy, aspiration pneumonia, acute on chronic hypoxic respiratory failure, delirium, failure to thrive, dysphagia.  Condition remains guarded.  Discussed in detail with son, he wants to continue current care at this time.  Doubtful patient will survive this hospitalization.  Initiate calorie count.  Assessment and Plan: Upper GI bleeding/nonbleeding duodenal ulcer, ABLA --Status post 3 units PRBC and 1 unit FFP transfusion during the hospitalization. Hgb stable. --GI signed off.  s/p EGD 8/11, showed nonbleeding duodenal ulcer with clean ulcer base.  GI recommended to continue PPI daily indefinitely and to hold anticoagulation for another 2 to 4 weeks if possible.    Acute metabolic encephalopathy/delirium, very mild dementia suspected.  Patient on Aricept prior to admission.  Son reports patient slightly confused (thought he had 2 sons) --Seen by psychiatry, continue Haldol as needed.  --Secondary to multiple acute issues. -- More awake today.  Still confused and history is unreliable.  Nursing will attempt to administer medications and may attempt diet depending on alertness.  Failure to thrive, dysphagia, aspiration pneumonia, with associated acute on chronic hypoxic respiratory failure. -- Has been on oxygen since pulmonary embolism in May.  Since that hospitalization in May the patient went from independent living to SNF where he continued to decline, has been wheelchair-bound but spent most of his time in bed in his room with interaction primarily  with nurses and his son.   --Limited diet as per speech therapy.  No significant intake over the last 96 hours at least secondary to mental status and inability to take medications or nutrition safely. --Continue oxygen, antibiotics, aspiration precautions -- Continue current care as per discussion with son today.   Acute kidney injury, Baseline creatinine of 0.9-1.01.   --Renal ultrasound showed mild left-sided hydronephrosis with no visible stones, layering debris in bladder which could be infectious versus hemorrhagic.  -- Recurrent and worsening.  Bladder scan was negative.  Will check renal ultrasound again, check urinalysis, repeat BMP in AM.   Hypokalemia -- Resolved.   PMH pulmonary emboli --History of subsegmental PE in 10/2021. --No need for further anticoagulation for this PE as per pulmonary.   Diabetes mellitus type 2 with hypoglycemia --stable. Continue CBGs with SSI.  Continue dextrose infusion.   Chronic diastolic heart failure --Currently compensated.   RESOLVED Thrombocytopenia Hypernatremia       Subjective:  Confused, history unreliable  Physical Exam: Vitals:   02/13/22 1419 02/13/22 2133 02/14/22 0545 02/14/22 1203  BP: (!) 128/58 125/73 (!) 147/65 (!) 143/72  Pulse: 78 63 62 71  Resp:  '16 16 16  '$ Temp: 98 F (36.7 C) 98.3 F (36.8 C) 98.4 F (36.9 C) 97.7 F (36.5 C)  TempSrc: Oral Oral Oral Oral  SpO2: 95% 99% 95% 100%  Weight:   69.5 kg   Height:       Physical Exam Vitals reviewed.  Constitutional:      General: He is not in acute distress.    Appearance: He is not ill-appearing or toxic-appearing.  Cardiovascular:     Rate and Rhythm: Normal rate and regular rhythm.  Heart sounds: No murmur heard. Pulmonary:     Effort: Pulmonary effort is normal. No respiratory distress.     Breath sounds: No wheezing, rhonchi or rales.  Neurological:     Mental Status: He is alert.  Psychiatric:        Mood and Affect: Mood normal.      Comments: Confused, speech clearer but not appropriate, does not respond to questions appropriately; quickly drifts off to sleep     Data Reviewed:  Void recorded Creatine up to 2.33 BUN up to 26  Family Communication: son Deryck Hippler at bedside  Disposition: Status is: Inpatient Remains inpatient appropriate because: pneumonia, FTT, dysphagia, AKI  Planned Discharge Destination:  TBD    Time spent: 25 minutes  Author: Murray Hodgkins, MD 02/14/2022 3:41 PM  For on call review [email or web address].

## 2022-02-15 DIAGNOSIS — G9341 Metabolic encephalopathy: Secondary | ICD-10-CM | POA: Diagnosis not present

## 2022-02-15 DIAGNOSIS — J690 Pneumonitis due to inhalation of food and vomit: Secondary | ICD-10-CM | POA: Diagnosis not present

## 2022-02-15 DIAGNOSIS — K922 Gastrointestinal hemorrhage, unspecified: Secondary | ICD-10-CM | POA: Diagnosis not present

## 2022-02-15 DIAGNOSIS — E119 Type 2 diabetes mellitus without complications: Secondary | ICD-10-CM | POA: Diagnosis not present

## 2022-02-15 LAB — PHOSPHORUS: Phosphorus: 4.8 mg/dL — ABNORMAL HIGH (ref 2.5–4.6)

## 2022-02-15 LAB — BASIC METABOLIC PANEL
Anion gap: 10 (ref 5–15)
BUN: 26 mg/dL — ABNORMAL HIGH (ref 8–23)
CO2: 27 mmol/L (ref 22–32)
Calcium: 8.2 mg/dL — ABNORMAL LOW (ref 8.9–10.3)
Chloride: 109 mmol/L (ref 98–111)
Creatinine, Ser: 2.26 mg/dL — ABNORMAL HIGH (ref 0.61–1.24)
GFR, Estimated: 26 mL/min — ABNORMAL LOW (ref >60)
Glucose, Bld: 113 mg/dL — ABNORMAL HIGH (ref 70–99)
Potassium: 3.6 mmol/L (ref 3.5–5.1)
Sodium: 146 mmol/L — ABNORMAL HIGH (ref 135–145)

## 2022-02-15 LAB — GLUCOSE, CAPILLARY
Glucose-Capillary: 103 mg/dL — ABNORMAL HIGH (ref 70–99)
Glucose-Capillary: 106 mg/dL — ABNORMAL HIGH (ref 70–99)
Glucose-Capillary: 162 mg/dL — ABNORMAL HIGH (ref 70–99)

## 2022-02-15 LAB — MAGNESIUM: Magnesium: 1.8 mg/dL (ref 1.7–2.4)

## 2022-02-15 MED ORDER — MORPHINE SULFATE (PF) 2 MG/ML IV SOLN: 1.0000 mg | INTRAVENOUS | Status: DC | PRN

## 2022-02-15 MED ORDER — GLYCOPYRROLATE 0.2 MG/ML IJ SOLN: 0.2000 mg | INTRAMUSCULAR | Status: DC | PRN

## 2022-02-15 MED ORDER — LORAZEPAM 2 MG/ML PO CONC: 1.0000 mg | ORAL | Status: DC | PRN

## 2022-02-15 MED ORDER — LORAZEPAM 1 MG PO TABS: 1.0000 mg | ORAL_TABLET | ORAL | Status: DC | PRN

## 2022-02-15 MED ORDER — MORPHINE SULFATE (CONCENTRATE) 10 MG/0.5ML PO SOLN: 5.0000 mg | ORAL | Status: DC | PRN

## 2022-02-15 MED ORDER — HALOPERIDOL 0.5 MG PO TABS: 0.5000 mg | ORAL_TABLET | ORAL | Status: DC | PRN

## 2022-02-15 MED ORDER — HALOPERIDOL LACTATE 5 MG/ML IJ SOLN: 0.5000 mg | INTRAMUSCULAR | Status: DC | PRN

## 2022-02-15 MED ORDER — ONDANSETRON HCL 4 MG/2ML IJ SOLN: 4.0000 mg | Freq: Four times a day (QID) | INTRAMUSCULAR | Status: DC | PRN

## 2022-02-15 MED ORDER — LORAZEPAM 2 MG/ML IJ SOLN: 1.0000 mg | INTRAMUSCULAR | Status: DC | PRN

## 2022-02-15 MED ORDER — GLYCOPYRROLATE 1 MG PO TABS: 1.0000 mg | ORAL_TABLET | ORAL | Status: DC | PRN

## 2022-02-15 MED ORDER — ACETAMINOPHEN 325 MG PO TABS: 650.0000 mg | ORAL_TABLET | Freq: Four times a day (QID) | ORAL | Status: DC | PRN

## 2022-02-15 MED ORDER — ONDANSETRON 4 MG PO TBDP: 4.0000 mg | ORAL_TABLET | Freq: Four times a day (QID) | ORAL | Status: DC | PRN

## 2022-02-15 MED ORDER — ACETAMINOPHEN 650 MG RE SUPP: 650.0000 mg | Freq: Four times a day (QID) | RECTAL | Status: DC | PRN

## 2022-02-15 MED ORDER — HALOPERIDOL LACTATE 2 MG/ML PO CONC: 0.5000 mg | ORAL | Status: DC | PRN

## 2022-02-15 MED ORDER — DIPHENHYDRAMINE HCL 50 MG/ML IJ SOLN: 12.5000 mg | INTRAMUSCULAR | Status: DC | PRN

## 2022-02-15 NOTE — Progress Notes (Signed)
Progress Note   Patient: Robert Beltran:[id number] DOB: 1923-08-08 DOA: 02/04/2022     11 DOS: the patient was seen and examined on 02/15/2022   Brief hospital course: 88 year old man presenting with anemia, melanotic stool, admitted by critical care to the ICU, seen by GI, underwent EGD showing nonbleeding duodenal ulcer, underwent PRBC transfusion, stabilized and transferred to [facility] 8/12.  Hospitalization complicated by persistent acute metabolic encephalopathy, aspiration pneumonia, acute on chronic hypoxic respiratory failure, delirium, failure to thrive, dysphagia, developing AKI.  Patient failed to respond to treatment, remained encephalopathic and developed acute kidney injury despite treatment.  Discussions with son/healthcare power of attorney, patient was made DNR and subsequently comfort care.  Assessment and Plan: Upper GI bleeding/nonbleeding duodenal ulcer, ABLA --Status post 3 units PRBC and 1 unit FFP transfusion during the hospitalization. Hgb stable. --GI signed off.  s/p EGD 8/11, showed nonbleeding duodenal ulcer with clean ulcer base.     Acute metabolic encephalopathy/delirium, very mild dementia suspected.  Patient on Aricept prior to admission.  Son reports patient slightly confused (thought he had 2 sons) --Seen by psychiatry, continue Haldol as needed.  --Secondary to multiple acute issues. -- Persistent, do not expect to see improvement   Failure to thrive, dysphagia, aspiration pneumonia, with associated acute on chronic hypoxic respiratory failure. -- Has been on oxygen since pulmonary embolism in May.  Since that hospitalization in May the patient went from independent living to SNF where he continued to decline, has been wheelchair-bound but spent most of his time in bed in his room with interaction primarily with nurses and his son.   -- Goals of care now full comfort care, stop antibiotics and fluids.  Diet as desired.   Acute kidney injury,  Baseline creatinine of 0.9-1.01.   --Renal ultrasound showed mild left-sided hydronephrosis with no visible stones, layering debris in bladder which could be infectious versus hemorrhagic.  -- Recurrent and worsening.  Bladder scan was negative.  Renal ultrasound and urinalysis unremarkable.   PMH pulmonary emboli --History of subsegmental PE in 10/2021. --No need for further anticoagulation for this PE as per pulmonary.   Diabetes mellitus type 2 with hypoglycemia --stable.  Stop CBGs.   Chronic diastolic heart failure --Currently compensated.   RESOLVED Thrombocytopenia Hypernatremia  Discussed in detail with son at bedside and son's friend [name] by telephone.  We discussed patient's clinical worsening including AKI and that he appears to be slowly dying.  After discussing options, son elected for comfort care which I think is appropriate.  We will observe for the next 24 to 48 hours and if condition is stable, consider transfer to residential hospice.      Subjective:  Sleeping   Physical Exam: Vitals:   02/14/22 1951 02/15/22 0422 02/15/22 0500 02/15/22 1303  BP: (!) 150/88 102/69  (!) 148/71  Pulse: 75 83  78  Resp: '18 18  17  '$ Temp: 98.3 F (36.8 C) 98.2 F (36.8 C)  98.6 F (37 C)  TempSrc: Oral Oral  Oral  SpO2: (!) 86% 100%  98%  Weight:   71.5 kg   Height:       Physical Exam Vitals reviewed.  Constitutional:      General: He is not in acute distress.    Appearance: He is ill-appearing. He is not toxic-appearing.  Cardiovascular:     Rate and Rhythm: Normal rate and regular rhythm.     Heart sounds: No murmur heard. Pulmonary:     Effort: Pulmonary effort  is normal. No respiratory distress.     Breath sounds: No wheezing, rhonchi or rales.  Neurological:     Mental Status: He is alert.  Psychiatric:     Comments: Sleeping, not alert     Data Reviewed:  UOP 175 Creatinine up to 2.26  Family Communication: son at bedside  Disposition: Status  is: Inpatient Remains inpatient appropriate because: now comfort care  Planned Discharge Destination:  inpatient death vs residential hospice if stablizes    Time spent: 35 minutes  Author: Murray Hodgkins, MD 02/15/2022 3:53 PM  For on call review [email or web address].

## 2022-02-15 NOTE — TOC Progression Note (Signed)
Transition of Care [facility]) - Progression Note    Patient Details  Name: Robert Beltran MRN: [id number] Date of Birth: 12-14-1923  Transition of Care [facility]) CM/SW Contact  Ross Ludwig, [place] Phone Number: 02/15/2022, 4:53 PM  Clinical Narrative:     Patient now on comfort care, CSW to continue to follow patient's progress throughout discharge planning.  Expected Discharge Plan: [place] Barriers to Discharge: Continued Medical Work up  Expected Discharge Plan and Services Expected Discharge Plan: [place]   Discharge Planning Services: CM Consult Post Acute Care Choice: [place] Living arrangements for the past 2 months: [place]                                       Social Determinants of Health (SDOH) Interventions    Readmission Risk Interventions     No data to display

## 2022-02-15 NOTE — Care Management Important Message (Signed)
Important Message  Patient Details IM Letter placed in Patients room. Name: Robert Beltran MRN: [id number] Date of Birth: 1924-01-28   Medicare Important Message Given:  Yes     Kerin Salen 02/15/2022, 10:12 AM

## 2022-02-16 DIAGNOSIS — R1310 Dysphagia, unspecified: Secondary | ICD-10-CM | POA: Diagnosis not present

## 2022-02-16 DIAGNOSIS — K922 Gastrointestinal hemorrhage, unspecified: Secondary | ICD-10-CM | POA: Diagnosis not present

## 2022-02-16 DIAGNOSIS — G9341 Metabolic encephalopathy: Secondary | ICD-10-CM | POA: Diagnosis not present

## 2022-02-16 DIAGNOSIS — J690 Pneumonitis due to inhalation of food and vomit: Secondary | ICD-10-CM | POA: Diagnosis not present

## 2022-02-16 NOTE — Progress Notes (Signed)
Progress Note   Patient: Robert Beltran JXB:[id number] DOB: 02-13-1924 DOA: 02/04/2022     86 DOS: the patient was seen and examined on 02/16/2022   Brief hospital course: 88 year old man presenting with anemia, melanotic stool, admitted by critical care to the ICU, seen by GI, underwent EGD showing nonbleeding duodenal ulcer, underwent PRBC transfusion, stabilized and transferred to [facility] 8/12.  Hospitalization complicated by persistent acute metabolic encephalopathy, aspiration pneumonia, acute on chronic hypoxic respiratory failure, delirium, failure to thrive, dysphagia, developing AKI.  Patient failed to respond to treatment, remained encephalopathic and developed acute kidney injury despite treatment.  In discussion with son/healthcare power of attorney, patient was made DNR and subsequently comfort care.  If stabilizes, anticipate transfer to hospice.  If declines, inpatient death anticipated.  Assessment and Plan: Upper GI bleeding/nonbleeding duodenal ulcer, ABLA --Status post 3 units PRBC and 1 unit FFP transfusion during the hospitalization. Hgb stable. --GI signed off.  s/p EGD 8/11, showed nonbleeding duodenal ulcer with clean ulcer base.     Acute metabolic encephalopathy/delirium, very mild dementia suspected.  Patient on Aricept prior to admission.  Son reported patient slightly confused (thought he had 2 sons) --Seen by psychiatry, continue Haldol as needed.  --Secondary to multiple acute issues. -- Persistent, do not expect to see improvement   Failure to thrive, dysphagia, aspiration pneumonia, with associated acute on chronic hypoxic respiratory failure. -- Has been on oxygen since pulmonary embolism in May.  Since that hospitalization in May the patient went from independent living to SNF where he continued to decline, has been wheelchair-bound but spent most of his time in bed in his room with interaction primarily with nurses and his son.   -- Goals of care: full  comfort care   Acute kidney injury, Baseline creatinine of 0.9-1.01.   --Renal ultrasound showed mild left-sided hydronephrosis with no visible stones, layering debris in bladder which could be infectious versus hemorrhagic.  -- Recurrent and worsening.  Bladder scan was negative.  Repeat renal ultrasound and urinalysis unremarkable.   PMH pulmonary emboli --History of subsegmental PE in 10/2021. --No need for further anticoagulation for this PE as per pulmonary.   Diabetes mellitus type 2 with hypoglycemia --comfort care   Chronic diastolic heart failure --Currently compensated.   RESOLVED Thrombocytopenia Hypernatremia  Continue full comfort care.     Subjective:  Had some ice cream last night per son Resting comfortably today  Physical Exam: Vitals:   02/15/22 1303 02/15/22 2122 02/16/22 0700 02/16/22 0903  BP: (!) 148/71 (!) 167/81 (!) 163/73 (!) 173/75  Pulse: 78 77 93 67  Resp: '17 17 17 18  '$ Temp: 98.6 F (37 C) 98.3 F (36.8 C) 98.7 F (37.1 C) 98 F (36.7 C)  TempSrc: Oral Oral Oral Axillary  SpO2: 98% (!) 88% (!) 88% 96%  Weight:      Height:       Physical Exam Vitals reviewed.  Constitutional:      General: He is not in acute distress.    Appearance: He is not ill-appearing or toxic-appearing.  Cardiovascular:     Rate and Rhythm: Normal rate and regular rhythm.     Heart sounds: No murmur heard. Pulmonary:     Effort: Pulmonary effort is normal. No respiratory distress.     Breath sounds: No wheezing, rhonchi or rales.  Neurological:     Mental Status: He is alert.  Psychiatric:     Comments: Briefly arouses, affable     Data Reviewed:  There are no new results to review at this time.  Family Communication: son Heyward Douthit at bedside  Disposition: Status is: Inpatient Remains inpatient appropriate because: comfort care  Planned Discharge Destination:  possibly residential hospice vs inpatient death    Time spent: 25  minutes  Author: Murray Hodgkins, MD 02/16/2022 5:01 PM  For on call review [email or web address].

## 2022-02-17 DIAGNOSIS — K922 Gastrointestinal hemorrhage, unspecified: Secondary | ICD-10-CM | POA: Diagnosis not present

## 2022-02-17 DIAGNOSIS — G9341 Metabolic encephalopathy: Secondary | ICD-10-CM | POA: Diagnosis not present

## 2022-02-17 DIAGNOSIS — N179 Acute kidney failure, unspecified: Secondary | ICD-10-CM | POA: Diagnosis not present

## 2022-02-17 NOTE — TOC Transition Note (Signed)
Transition of Care [facility]) - CM/SW Discharge Note   Patient Details  Name: EKANSH SHERK MRN: [id number] Date of Birth: 1924/03/11  Transition of Care [facility]) CM/SW Contact:  Illene Regulus, LCSW Phone Number: 02/17/2022, 1:29 PM   Clinical Narrative:    CSW spoke with pt's Son Santhiago Collingsworth to discuss hospice services. Pt's son has chosen [facility] as the preferred Hospice agency. CSW spoke to [place] from [place] to make referral.   CSW spoke with [place] from [place] who confirmed pt can return back to facility and transition to LTC.   Final next level of care: [place] Barriers to Discharge: Continued Medical Work up   Patient Goals and CMS Choice Patient states their goals for this hospitalization and ongoing recovery are:: Return back to [facility] SNF CMS Medicare.gov Compare Post Acute Care list provided to:: Patient Represenative (must comment) [name]) Choice offered to / list presented to : Adult Children [facility] administrator))  Discharge Placement                       Discharge Plan and Services   Discharge Planning Services: CM Consult Post Acute Care Choice: Skilled Nursing Facility                               Social Determinants of Health (SDOH) Interventions     Readmission Risk Interventions     No data to display

## 2022-02-17 NOTE — TOC Progression Note (Signed)
Transition of Care [facility]) - Progression Note    Patient Details  Name: JERRICO COVELLO MRN: [id number] Date of Birth: 1924-01-09  Transition of Care [facility]) CM/SW Contact  Joaquin Courts, RN Phone Number: 02/17/2022, 3:32 PM  Clinical Narrative:    CM spoke with patient's son who reports he has not fully made up his mind about patient returning to [facility] as he has concerns with the cost of the stay, he reports he spoke with [facility] earlier and was informed the cost of the stay would be covered but wanted to confirm this.  This CM advised patient to contact [facility] again to discuss the costs/billing with them, this CM also informed son that CM has also called [facility] and was unable to reach rep.   CM asked if son has an alternative plan in place if he decides that patient should not return to [facility], and son states he would need to figure that out.   Expected Discharge Plan: [place] Barriers to Discharge: Continued Medical Work up  Expected Discharge Plan and Services Expected Discharge Plan: [place]   Discharge Planning Services: CM Consult Post Acute Care Choice: [place] Living arrangements for the past 2 months: [place]                                       Social Determinants of Health (SDOH) Interventions    Readmission Risk Interventions     No data to display

## 2022-02-17 NOTE — Progress Notes (Signed)
PROGRESS NOTE   Robert Beltran  IRJ:[id number]    DOB: 1924/02/07    DOA: 02/04/2022  PCP: Crist Infante, MD   I have briefly reviewed patients previous medical records in [facility].  Chief Complaint  Patient presents with   Weakness   Fatigue    Brief Narrative:  88 year old man presenting with anemia, melanotic stool, admitted by critical care to the ICU, seen by GI, underwent EGD showing nonbleeding duodenal ulcer, underwent PRBC transfusion, stabilized and transferred to [facility] hospitalist 8/12.  Hospitalization complicated by persistent acute metabolic encephalopathy, aspiration pneumonia, acute on chronic hypoxic respiratory failure, delirium, failure to thrive, dysphagia, developing AKI.  Patient failed to respond to treatment, remained encephalopathic and developed acute kidney injury despite treatment.  In discussion with son/healthcare power of attorney, patient was made DNR and subsequently comfort care.  Although overall prognosis is guarded, currently stable, as per evaluation by team, not appropriate for residential hospice, prior SNF willing to take him back with LTC with hospice services but son unsure if he will be able to bear the financial responsibilities.  TOC evaluating.   Assessment & Plan:  Principal Problem:   GI bleed Active Problems:   Controlled type 2 diabetes mellitus without complication, without long-term current use of insulin (HCC)   Aspiration pneumonia (HCC)   Acute respiratory failure with hypoxia (HCC)   Hypertension   Chronic diastolic CHF (congestive heart failure) (HCC)   Hyperkalemia   Leukocytosis   Thrombocytopenia (HCC)   Hypernatremia   AKI (acute kidney injury) (HCC)   Lactic acidosis   History of pulmonary embolism   Delirium   Acute metabolic encephalopathy   FTT (failure to thrive) in adult   Dysphagia   Upper GI bleeding/nonbleeding duodenal ulcer, ABLA --Status post 3 units PRBC and 1 unit FFP transfusion during the  hospitalization. Hgb stable.  No longer checking labs since he is comfort care. --GI signed off.  s/p EGD 8/11, showed nonbleeding duodenal ulcer with clean ulcer base.     Acute metabolic encephalopathy/delirium, very mild dementia suspected.  Patient on Aricept prior to admission.  Son reported patient slightly confused (thought he had 2 sons) --Seen by psychiatry, continue Haldol as needed.  --Secondary to multiple acute issues. -- Persistent, do not expect to see improvement.  I am seeing him for the very first time.  Alert and oriented x2.   Failure to thrive, dysphagia, aspiration pneumonia, with associated acute on chronic hypoxic respiratory failure. -- Has been on oxygen since pulmonary embolism in May.  Since that hospitalization in May the patient went from independent living to SNF where he continued to decline, has been wheelchair-bound but spent most of his time in bed in his room with interaction primarily with nurses and his son.   -- Goals of care: full comfort care.  This has been determined by prior [facility] MD before I resumed care of this patient.  Agree with same at this time.   Acute kidney injury, Baseline creatinine of 0.9-1.01.   --Renal ultrasound showed mild left-sided hydronephrosis with no visible stones, layering debris in bladder which could be infectious versus hemorrhagic.  -- Recurrent and worsening.  Bladder scan was negative.  Repeat renal ultrasound and urinalysis unremarkable.   PMH pulmonary emboli --History of subsegmental PE in 10/2021. --No need for further anticoagulation for this PE as per pulmonary.   Diabetes mellitus type 2 with hypoglycemia --comfort care   Chronic diastolic heart failure --Currently compensated.   RESOLVED Thrombocytopenia  Hypernatremia   Continue full comfort care.  Body mass index is 22.62 kg/m.   DVT prophylaxis:   SCDs.   Code Status: DNR:  Family Communication: Son at bedside in detail. Disposition:  Status is:  Inpatient Remains inpatient appropriate because: Full comfort care, awaiting discharge disposition     Consultants:   Psychiatry Palliative care medicine Gastroenterology  Procedures:   As noted above  Antimicrobials:      Subjective:  Noted and oriented x2.  Reports that he feels okay.  No complaints reported.  Son is at bedside.  States that he ate much better than he has eaten in the last several days.  Objective:   Vitals:   02/15/22 2122 02/16/22 0700 02/16/22 0903 02/17/22 0514  BP: (!) 167/81 (!) 163/73 (!) 173/75 (!) 149/65  Pulse: 77 93 67 81  Resp: '17 17 18 18  '$ Temp: 98.3 F (36.8 C) 98.7 F (37.1 C) 98 F (36.7 C) 98 F (36.7 C)  TempSrc: Oral Oral Axillary Oral  SpO2: (!) 88% (!) 88% 96% 91%  Weight:      Height:        General exam: Very elderly male, moderately built and frail, chronically ill looking, lying comfortably propped up in bed without distress.  Has [place] oxygen on. Respiratory system: Clear to auscultation. Respiratory effort normal. Cardiovascular system: S1 & S2 heard, RRR. No JVD, murmurs, rubs, gallops or clicks. No pedal edema.  Not on telemetry. Gastrointestinal system: Abdomen is nondistended, soft and nontender. No organomegaly or masses felt. Normal bowel sounds heard. Central nervous system: Alert and oriented x2. No focal neurological deficits. Extremities: Symmetric 5 x 5 power. Skin: No rashes, lesions or ulcers Psychiatry: Judgement and insight impaired. Mood & affect appropriate.     Data Reviewed:   I have personally reviewed following labs and imaging studies   CBC: No results for input(s): "WBC", "NEUTROABS", "HGB", "HCT", "MCV", "PLT" in the last 168 hours.  Basic Metabolic Panel: Recent Labs  Lab 02/11/22 0644 02/13/22 0555 02/14/22 0602 02/15/22 0603  NA 144 139 143 146*  K 3.7 3.5 4.1 3.6  CL 106 102 108 109  CO2 '31 28 27 27  '$ GLUCOSE 128* 94 90 113*  BUN 14 23 26* 26*  CREATININE 1.13 2.00* 2.33* 2.26*   CALCIUM 8.6* 8.1* 8.3* 8.2*  MG 2.1 1.9  --  1.8  PHOS 3.7 5.6*  --  4.8*    Liver Function Tests: No results for input(s): "AST", "ALT", "ALKPHOS", "BILITOT", "PROT", "ALBUMIN" in the last 168 hours.  CBG: Recent Labs  Lab 02/15/22 0417 02/15/22 0749 02/15/22 1151  GLUCAP 103* 106* 162*    Microbiology Studies:  No results found for this or any previous visit (from the past 240 hour(s)).  Radiology Studies:  No results found.  Scheduled Meds:    Chlorhexidine Gluconate Cloth  6 each Topical Daily   mouth rinse  15 mL Mouth Rinse 4 times per day    Continuous Infusions:    sodium chloride Stopped (02/06/22 1643)     LOS: 13 days     Vernell Leep, MD,  FACP, [facility], [facility], [facility] (Care Management Physician Certified) [place]  To contact the attending provider between 7A-7P or the covering provider during after hours 7P-7A, please log into the web site [email or web address] and access using universal Agency password for that web site. If you do not have the password, please call the hospital operator.  02/17/2022, 2:52  PM

## 2022-02-17 NOTE — Progress Notes (Addendum)
Manufacturing engineer [facility])  Referral received for hospice services at [facility] upon discharge.  Spoke with son, he was unaware that there would be financial obligations upon his fathers return to [place]. He was not ready to make a decision today about discharge options.  Son inquired if he was able to go to [facility], but he does not appear to have a life expectancy of < 2 weeks so he is ineligible to go to [facility] at discharge.   Updated hospital team about concerns.  [facility] will continue to follow and anticipate starting hospice services once a d/c location has been solidified.  Venia Carbon DNP, RN [facility] Liaison (Epic chat or [phone])

## 2022-02-18 DIAGNOSIS — K922 Gastrointestinal hemorrhage, unspecified: Secondary | ICD-10-CM | POA: Diagnosis not present

## 2022-02-18 DIAGNOSIS — J690 Pneumonitis due to inhalation of food and vomit: Secondary | ICD-10-CM | POA: Diagnosis not present

## 2022-02-18 MED ORDER — FOOD THICKENER (SIMPLYTHICK): 1.0000 | ORAL | Status: AC | PRN

## 2022-02-18 NOTE — Discharge Summary (Signed)
Physician Discharge Summary  Robert Beltran CHY:[id number] DOB: 1924/04/09  PCP: Crist Infante, MD  Admitted from: [facility] SNF Discharged to: [place] with hospice services  Admit date: 02/04/2022 Discharge date: 02/18/2022  Recommendations for Outpatient Follow-up:    Follow-up Information     MD at SNF Follow up.   Why: To be seen in 2 to 3 days for ongoing hospice care.        Crist Infante, MD. Schedule an appointment as soon as possible for a visit.   Specialty: Internal Medicine Why: As needed.  Patient discharging to LTC with hospice. Contact information: [place] [phone] [place]: None    Equipment/Devices: None    Discharge Condition: Improved and stable.  Overall prognosis guarded and poor with high likelihood of decline.   Code Status: DNR Diet recommendation:  Discharge Diet Orders (From admission, onward)     Start     Ordered   02/18/22 0000  Diet - low sodium heart healthy       Comments: DIET - DYS 1 ; Fluid consistency: Nectar Thick   02/18/22 1215             Discharge Diagnoses:  Principal Problem:   GI bleed Active Problems:   Controlled type 2 diabetes mellitus without complication, without long-term current use of insulin (HCC)   Aspiration pneumonia (HCC)   Acute respiratory failure with hypoxia (HCC)   Hypertension   Chronic diastolic CHF (congestive heart failure) (HCC)   Hyperkalemia   Leukocytosis   Thrombocytopenia (HCC)   Hypernatremia   AKI (acute kidney injury) (HCC)   Lactic acidosis   History of pulmonary embolism   Delirium   Acute metabolic encephalopathy   FTT (failure to thrive) in adult   Dysphagia   Brief Summary: 88 year old man presenting with anemia, melanotic stool, admitted by critical care to the ICU, seen by GI, underwent EGD showing nonbleeding duodenal ulcer, underwent PRBC transfusion, stabilized and transferred to [facility] 8/12.  Hospitalization complicated by persistent acute metabolic encephalopathy, aspiration pneumonia, acute on chronic hypoxic respiratory failure, delirium, failure to thrive, dysphagia, developing AKI.  Patient failed to respond to treatment, remained encephalopathic and developed acute kidney injury despite treatment.  In discussion with son/healthcare power of attorney, patient was made DNR and subsequently comfort care by providers taking care of him prior to 8/23.  Although overall prognosis is guarded, currently stable, as per evaluation by team, not appropriate for residential hospice, prior SNF willing to take him back with LTC with hospice services, which was confirmed by son and TOC today.     Assessment & Plan:    Upper GI bleeding/nonbleeding duodenal ulcer, ABLA --Status post 3 units PRBC and 1 unit FFP transfusion during the hospitalization. Hgb stable.  No longer checking labs since he is comfort care. --GI signed off.  s/p EGD 8/11, showed nonbleeding duodenal ulcer with clean ulcer base.     Acute metabolic encephalopathy/delirium, very mild dementia suspected.  Patient on Aricept prior to admission.  Son reported patient slightly confused (thought he had 2 sons) --Seen by psychiatry, continue Haldol as needed.  --Secondary to multiple acute issues. -- Persistent, do not expect to see improvement.  Alert and oriented x2 yesterday.  Sleeping this morning and did not wake him up to examine.   Failure to thrive, dysphagia, aspiration pneumonia, with associated acute on  chronic hypoxic respiratory failure. -- Has been on oxygen since pulmonary embolism in May.  Since that hospitalization in May the patient went from independent living to SNF where he continued to decline, has been wheelchair-bound but spent most of his time in bed in his room with interaction primarily with nurses and his son.   -- Goals of care: full comfort care.  This has been determined by prior [facility] MD  before I assumed care of this patient.  Agree with same at this time. -Patient has not required any as needed comfort meds.  Thereby only discharging on prior home dose of Tylenol.  All medications nonessential to comfort were discontinued.  Based on clinical course at facility, may consider adding any of the as needed meds such as morphine, Ativan, Robinul etc.  Also can consult palliative care providers/hospice MD to follow at facility.   Acute kidney injury, Baseline creatinine of 0.9-1.01.   --Renal ultrasound showed mild left-sided hydronephrosis with no visible stones, layering debris in bladder which could be infectious versus hemorrhagic.  -- Recurrent and worsening.  Bladder scan was negative.  Repeat renal ultrasound and urinalysis unremarkable.   PMH pulmonary emboli --History of subsegmental PE in 10/2021. --No need for further anticoagulation for this PE as per pulmonary.   Diabetes mellitus type 2 with hypoglycemia --comfort care   Chronic diastolic heart failure --Currently compensated.   RESOLVED Thrombocytopenia Hypernatremia   Continue full comfort care.   Body mass index is 22.62 kg/m.     Consultants:   Psychiatry Palliative care medicine Gastroenterology   Procedures:   As noted above   Discharge Instructions  Discharge Instructions     Call MD for:  difficulty breathing, headache or visual disturbances   Complete by: As directed    Call MD for:  extreme fatigue   Complete by: As directed    Call MD for:  persistant dizziness or light-headedness   Complete by: As directed    Call MD for:  persistant nausea and vomiting   Complete by: As directed    Call MD for:  severe uncontrolled pain   Complete by: As directed    Call MD for:  temperature >100.4   Complete by: As directed    Diet - low sodium heart healthy   Complete by: As directed    DIET - DYS 1 ; Fluid consistency: Nectar Thick   Increase activity slowly   Complete by: As directed          Medication List     STOP taking these medications    apixaban 5 MG Tabs tablet Commonly known as: ELIQUIS   candesartan 8 MG tablet Commonly known as: ATACAND   donepezil 5 MG tablet Commonly known as: ARICEPT   escitalopram 5 MG tablet Commonly known as: LEXAPRO   fluticasone 50 MCG/ACT nasal spray Commonly known as: FLONASE   levothyroxine 75 MCG tablet Commonly known as: SYNTHROID   metFORMIN 500 MG tablet Commonly known as: GLUCOPHAGE   mirtazapine 7.5 MG tablet Commonly known as: REMERON   multivitamin with minerals Tabs tablet   OVER THE COUNTER MEDICATION   pantoprazole 40 MG tablet Commonly known as: PROTONIX   polyethylene glycol 17 g packet Commonly known as: MIRALAX / GLYCOLAX   rosuvastatin 10 MG tablet Commonly known as: CRESTOR       TAKE these medications    acetaminophen 500 MG tablet Commonly known as: TYLENOL Take 500 mg by mouth every 6 (six) hours as needed  for headache or moderate pain.   food thickener Gel Commonly known as: SIMPLYTHICK (NECTAR/LEVEL 2/MILDLY THICK) Take 1 packet by mouth as needed.       Allergies  Allergen Reactions   Aspirin Other (See Comments)    GI bleed, abdominal pain and "allergic," per [facility]   Nsaids Other (See Comments)    GI distress and "allergic," per MAR   Other Nausea Only and Other (See Comments)    "Reaction to Coricidin"   Bactrim [Sulfamethoxazole-Trimethoprim] Rash      Procedures/Studies: US RENAL  Result Date: 02/14/2022 CLINICAL DATA:  Acute renal insufficiency EXAM: RENAL / URINARY TRACT ULTRASOUND COMPLETE COMPARISON:  02/05/2022 FINDINGS: Right Kidney: Renal measurements: 7.8 x 5.2 x 4.9 cm = volume: 105 mL. Grossly normal right kidney for age. No hydronephrosis. Left Kidney: Renal measurements: 8.6 x 5.0 x 4.2 cm = volume: 95 mL. Normal kidney for age. No hydronephrosis. Bladder: Appears normal for degree of bladder distention. Other: Limitations secondary to patient  inability to cooperate/hold breath. IMPRESSION: Mildly limited exam. No hydronephrosis or specific explanation for acute renal insufficiency. Electronically Signed   By: Abigail Miyamoto M.D.   On: 02/14/2022 17:22   DG CHEST PORT 1 VIEW  Result Date: 02/09/2022 CLINICAL DATA:  Dyspnea in a 88 year old male. EXAM: PORTABLE CHEST 1 VIEW COMPARISON:  February 04, 2022 FINDINGS: Image rotated to the RIGHT. Accounting for this cardiomediastinal contours and hilar structures are stable. There is increased opacity at the RIGHT lung base compared to previous study. Also with slight increased opacity at the LEFT lung base. RIGHT hemidiaphragm remains elevated. On limited assessment no acute skeletal findings with severe degenerative changes in the bilateral glenohumeral joints. IMPRESSION: 1. Increasing opacity at the lung bases. Findings suspicious for recurrence of pneumonia particularly at the RIGHT lung base. 2. Chronic elevation of the RIGHT hemidiaphragm. Electronically Signed   By: Zetta Bills M.D.   On: 02/09/2022 09:13   DG Swallowing Func-Speech Pathology  Result Date: 02/08/2022 Table formatting from the original result was not included. Objective Swallowing Evaluation: Type of Study: MBS-Modified Barium Swallow Study  Patient Details Name: FARD BORUNDA MRN: [id number] Date of Birth: 05-23-1924 Today's Date: 02/08/2022 Time: SLP Start Time (ACUTE ONLY): 79 -SLP Stop Time (ACUTE ONLY): 1435 SLP Time Calculation (min) (ACUTE ONLY): 20 min Past Medical History: Past Medical History: Diagnosis Date  Arthritis   Benign prostate hyperplasia   Cancer ([place])   testicular cancer 1964  Chronic back pain   Depression   Diabetes mellitus without complication (HCC)   DJD (degenerative joint disease)   GERD (gastroesophageal reflux disease)   HLD (hyperlipidemia)   Hypertension   sees Dr. Crist Infante,  Past Surgical History: Past Surgical History: Procedure Laterality Date  APPENDECTOMY    1995  ESOPHAGOGASTRODUODENOSCOPY  (EGD) WITH PROPOFOL N/A 02/05/2022  Procedure: ESOPHAGOGASTRODUODENOSCOPY (EGD) WITH PROPOFOL;  Surgeon: Carol Ada, MD;  Location: [facility];  Service: Gastroenterology;  Laterality: N/A;  EYE SURGERY    bilateral surgery  LUMBAR LAMINECTOMY/DECOMPRESSION MICRODISCECTOMY N/A 08/29/2012  Procedure: LUMBAR LAMINECTOMY/DECOMPRESSION MICRODISCECTOMY 2 LEVELS;  Surgeon: Eustace Moore, MD;  Location: [facility];  Service: Neurosurgery;  Laterality: N/A;  SHOULDER SURGERY    right HPI: 88 yo male adm to [facility] with GI bleed.  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  Swallow eval ordered.  Most recent MBS 10/2021 showed  worsening pharyngeal swallow marked by delay, reduced tongue base retraction, pharyngeal constriction and anterior laryngeal movement - resulting in penetration and aspirtion *silent and audible*.  Throat clearing and delayed cough inconsistent with aspiration and cued cough ineffective to expel aspirate.  Chin tuck not helpful.  Small single boluses prevented aspiration and dry swallow swallow assisted to decrease retention.  Per son, pt was on a regular/thin diet PTA.  Subjective: awake, pleasant, cooperative  Recommendations for follow up therapy are one component of a multi-disciplinary discharge planning process, led by the attending physician.  Recommendations may be updated based on patient status, additional functional criteria and insurance authorization. Assessment / Plan / Recommendation   02/08/2022   2:54 PM Clinical Impressions Clinical Impression Patient's swallow function does not appear to have changed significantly since previous one complete 3 months ago. He exhibited oral delays with all consistencies but more pronounced with puree solids as he would appear to be chewing puree and had decreased bolus cohesion, leading to anterior to posterior  transit delays. During pharyngeal phase of swallow, he exhibited swallow initiation delays at level of vallecular sinus with puree solids, nectar thick liquids and small cup sips of thin liquids but with larger cup sips of thin liquids, patient exhibited swallow initiation delay at level of pyriform sinus. Only a trace amount of PO residuals observed in vallecular sinus with thin and nectar thick liquids and with puree solids and all residuals cleared pharynx with subsequent swallows. Patient did exhibit any instance of flash penetration (PAS 2) with nectar thick liquids when drinking with a straw but no penetration or aspiration of nectar thick liquids via cup sips. With thin liquids, patient exhibited penetration that did not clear (PAS 3) with larger sip but only flash penetration (PAS 2) with small controlled cup sips. 13 mm barium tablet taken with puree solids transited through UES without significant difficulty and esophageal sweep did not reveal any significant dysmotility or retrograde movement of barium. SLP is recommending to initiate a conservative diet of Dys 1 (puree) solids and nectar thick liquids with the option to have thin liquids in between meals. SLP contacted patient's son via phone call to discuss results and plan. SLP will continue to follow for diet toleration and ability to advance with solids and liquids. Also recommending patient resume SLP therapy when back at SNF. SLP Visit Diagnosis Dysphagia, oropharyngeal phase (R13.12) Impact on safety and function Mild aspiration risk     02/08/2022   2:54 PM Treatment Recommendations Treatment Recommendations Therapy as outlined in treatment plan below     02/08/2022   3:00 PM Prognosis Prognosis for Safe Diet Advancement Good Barriers to Reach Goals Severity of deficits;Cognitive deficits   02/08/2022   2:54 PM Diet Recommendations SLP Diet Recommendations Dysphagia 1 (Puree) solids;Nectar thick liquid Liquid Administration via Cup;No straw  Medication Administration Whole meds with puree Compensations Small sips/bites;Minimize environmental distractions;Slow rate     02/08/2022   2:54 PM Other Recommendations Oral Care Recommendations Oral care BID;Staff/trained caregiver to provide oral care Other Recommendations Order thickener from pharmacy;Prohibited food (jello, ice cream, thin soups);Have oral suction available;Clarify dietary restrictions Follow Up Recommendations Skilled nursing-short term rehab (<3 hours/day) Assistance recommended at discharge Frequent or constant Supervision/Assistance   02/08/2022   2:54 PM Frequency and Duration  Speech Therapy Frequency (ACUTE ONLY) min 2x/week Treatment Duration 1 week     02/08/2022   2:41 PM Oral Phase Oral Phase Impaired Oral - Nectar Cup Reduced posterior propulsion;Weak lingual manipulation Oral -  Nectar Straw Reduced posterior propulsion;Weak lingual manipulation Oral - Thin Cup Delayed oral transit;Reduced posterior propulsion;Weak lingual manipulation;Premature spillage Oral - Puree Weak lingual manipulation;Reduced posterior propulsion;Delayed oral transit Oral - Pill [facility]    02/08/2022   2:42 PM Pharyngeal Phase Pharyngeal Phase Impaired Pharyngeal- Nectar Cup Delayed swallow initiation-vallecula;Pharyngeal residue - valleculae Pharyngeal- Nectar Straw Delayed swallow initiation-vallecula;Reduced airway/laryngeal closure;Pharyngeal residue - valleculae;Penetration/Aspiration during swallow Pharyngeal Material enters airway, remains ABOVE vocal cords then ejected out Pharyngeal- Thin Cup Delayed swallow initiation-vallecula;Delayed swallow initiation-pyriform sinuses;Reduced airway/laryngeal closure;Penetration/Aspiration during swallow;Pharyngeal residue - valleculae Pharyngeal Material enters airway, remains ABOVE vocal cords and not ejected out Pharyngeal- Puree Delayed swallow initiation-vallecula;Pharyngeal residue - valleculae Pharyngeal- Pill [facility]    02/08/2022   2:44 PM Cervical Esophageal  Phase  Cervical Esophageal Phase [facility] Sonia Baller, MA, CCC-SLP Speech Therapy                     US RENAL  Result Date: 02/05/2022 CLINICAL DATA:  [id number].  Acute kidney injury. EXAM: RENAL / URINARY TRACT ULTRASOUND COMPLETE COMPARISON:  None Available. FINDINGS: Technical note: Difficult exam due to body habitus, bowel gas, and level of patient responsiveness. Right Kidney: Renal measurements: Small in length measuring 7.2 x 5.1 x 5.0 cm = volume: 97.6 mL. Echogenicity within normal limits but with slight general cortical thinning. No mass, stones or hydronephrosis visualized. Left Kidney: Renal measurements: 9.0 x 4.0 x 4.2 cm = volume: 79.9 mL. Echogenicity within normal limits. No mass or stones visualized. There is mild hydronephrosis. Bladder: Appears normal for degree of bladder distention, but does contain a small volume of dependent hypoechoic layering debris. Bladder volume measurements were not obtained. Ureteral jets were not seen during the study. Other: None. IMPRESSION: 1. Slightly small kidneys, minimal cortical thinning on the right but no increased cortical echogenicity either side. 2. Mild left hydronephrosis. No right hydronephrosis. No visible intrarenal stones. 3. No significant bladder thickening but there is layering debris in the bladder which could be hemorrhagic or infectious etiology. 4. Ureteral jets not seen during the exam. Electronically Signed   By: Telford Nab M.D.   On: 02/05/2022 00:39   DG Chest Port 1 View  Result Date: 02/04/2022 CLINICAL DATA:  Increased lethargy EXAM: PORTABLE CHEST 1 VIEW COMPARISON:  Previous studies including the examination of 11/15/2021 FINDINGS: There is poor inspiration. Cardiac size is within normal limits. Thoracic aorta is tortuous and ectatic. There is improvement in the aeration of both lungs. There are residual linear densities in both lower lung fields. Right hemidiaphragm is elevated. There is no significant pleural effusion or  pneumothorax. Severe degenerative changes are noted in both shoulders. IMPRESSION: There is interval improvement in aeration of both lungs. Residual linear densities in both lower lung fields suggest scarring or subsegmental atelectasis. No new focal infiltrates are seen. There is no pleural effusion. Severe degenerative changes are noted in both shoulders. Electronically Signed   By: Elmer Picker M.D.   On: 02/04/2022 19:00      Subjective: As per RN, tolerated diet this morning and ate well at breakfast.  During my visit, patient sleeping comfortably and did not attempt to wake him up.  Son at bedside and did not express any complaints or acute issues.  Discharge Exam:  Vitals:   02/16/22 0700 02/16/22 0903 02/17/22 0514 02/18/22 0508  BP: (!) 163/73 (!) 173/75 (!) 149/65 (!) 144/67  Pulse: 93 67 81 84  Resp: '17 18 18 18  '$ Temp: 98.7 F (  37.1 C) 98 F (36.7 C) 98 F (36.7 C) 97.6 F (36.4 C)  TempSrc: Oral Axillary Oral Oral  SpO2: (!) 88% 96% 91% (!) 74%  Weight:      Height:        General exam: Very elderly male, moderately built and frail, chronically ill looking, lying comfortably propped up in bed without distress.  Has [place] oxygen on. Respiratory system: Clear to auscultation.  No increased work of breathing. Cardiovascular system: S1 & S2 heard, RRR. No JVD, murmurs, rubs, gallops or clicks. No pedal edema.  Not on telemetry. Gastrointestinal system: Abdomen is nondistended, soft and nontender. No organomegaly or masses felt. Normal bowel sounds heard. Central nervous system: Patient's sleeping during my visit and did not attempt to wake him up to examine or cause discomfort.  No focal neurological deficits. Extremities: Symmetric 5 x 5 power. Skin: No rashes, lesions or ulcers Psychiatry: Judgement and insight impaired. Mood & affect appropriate.     The results of significant diagnostics from this hospitalization (including imaging, microbiology, ancillary and  laboratory) are listed below for reference.     Basic Metabolic Panel: Recent Labs  Lab 02/13/22 0555 02/14/22 0602 02/15/22 0603  NA 139 143 146*  K 3.5 4.1 3.6  CL 102 108 109  CO2 '28 27 27  '$ GLUCOSE 94 90 113*  BUN 23 26* 26*  CREATININE 2.00* 2.33* 2.26*  CALCIUM 8.1* 8.3* 8.2*  MG 1.9  --  1.8  PHOS 5.6*  --  4.8*     CBG: Recent Labs  Lab 02/14/22 1627 02/14/22 2001 02/15/22 0417 02/15/22 0749 02/15/22 1151  GLUCAP 115* 169* 103* 106* 162*     Urinalysis    Component Value Date/Time   COLORURINE YELLOW 02/14/2022 1958   APPEARANCEUR CLOUDY (A) 02/14/2022 1958   LABSPEC 1.021 02/14/2022 1958   PHURINE 5.0 02/14/2022 1958   GLUCOSEU NEGATIVE 02/14/2022 1958   HGBUR NEGATIVE 02/14/2022 [place] NEGATIVE 02/14/2022 1958   KETONESUR 5 (A) 02/14/2022 1958   PROTEINUR 30 (A) 02/14/2022 1958   NITRITE NEGATIVE 02/14/2022 1958   LEUKOCYTESUR NEGATIVE 02/14/2022 1958    Discussed in detail with patient's son at bedside, updated care and answered all questions.  Time coordinating discharge: 25 minutes  SIGNED:  Vernell Leep, MD,  FACP, [facility], [facility], [facility] (Care Management Physician Certified). [facility] & Physician Advisor  To contact the attending provider between 7A-7P or the covering provider during after hours 7P-7A, please log into the web site [email or web address] and access using universal [place] password for that web site. If you do not have the password, please call the hospital operator.

## 2022-02-18 NOTE — TOC Transition Note (Signed)
Transition of Care [facility]) - CM/SW Discharge Note   Patient Details  Name: Robert Beltran MRN: [id number] Date of Birth: 1923-10-24  Transition of Care [facility] CM/SW Contact:  Robert Regulus, Robert Beltran Phone Number: 02/18/2022, 1:26 PM   Clinical Narrative:     Pt to d/c to [facility] for LTC with hospice services. [facility] has been arrange for transport to facility. Son was provided with  update . TOC sign off   Final next level of care: [place] Barriers to Discharge: No Barriers Identified   Patient Goals and CMS Choice Patient states their goals for this hospitalization and ongoing recovery are:: skilled nursing CMS Medicare.gov Compare Post Acute Care list provided to:: Patient Represenative (must comment) Choice offered to / list presented to : Adult Children  Discharge Placement              Patient chooses bed at: [facility] Patient to be transferred to facility by: [facility] Name of family member notified: Robert Beltran,Robert Beltran,Robert Beltran Patient and family notified of of transfer: 02/18/22  Discharge Plan and Services   Discharge Planning Services: CM Consult Post Acute Care Choice: [facility]                               Social Determinants of Health (SDOH) Interventions     Readmission Risk Interventions     No data to display

## 2022-02-18 NOTE — Progress Notes (Addendum)
Manufacturing engineer [facility])  Referral received for hospice services at [facility] upon discharge.   Spoke to son, he met with [facility] and he would like the patient to go back to this facility with hospice. Hospice questions reviewed with son. Upon discharge please use [facility] for transport. TOC aware.   Report given to bedside RN. No concerns at this time. Patient ate breakfast this am and is resting in bed. [place] phone numbers were given to son Bobbyjoe, who is primary contact.   Thank you for this referral.  Clementeen Hoof, [place], [facility] [phone]

## 2022-02-18 NOTE — Progress Notes (Signed)
Called report to Izell Bancroft ,RN at [facility].

## 2022-02-26 DEATH — deceased

## 2022-04-28 DEATH — deceased

## 2024-02-15 IMAGING — DX DG CHEST 1V PORT
1 series · 1 of 1 positions shown · non-contrast
Comparison: Chest x-ray 11/12/2021.

CLINICAL DATA: [AGE] male with history of multifocal
pneumonia.

EXAM:
PORTABLE CHEST 1 VIEW

[chest ap]
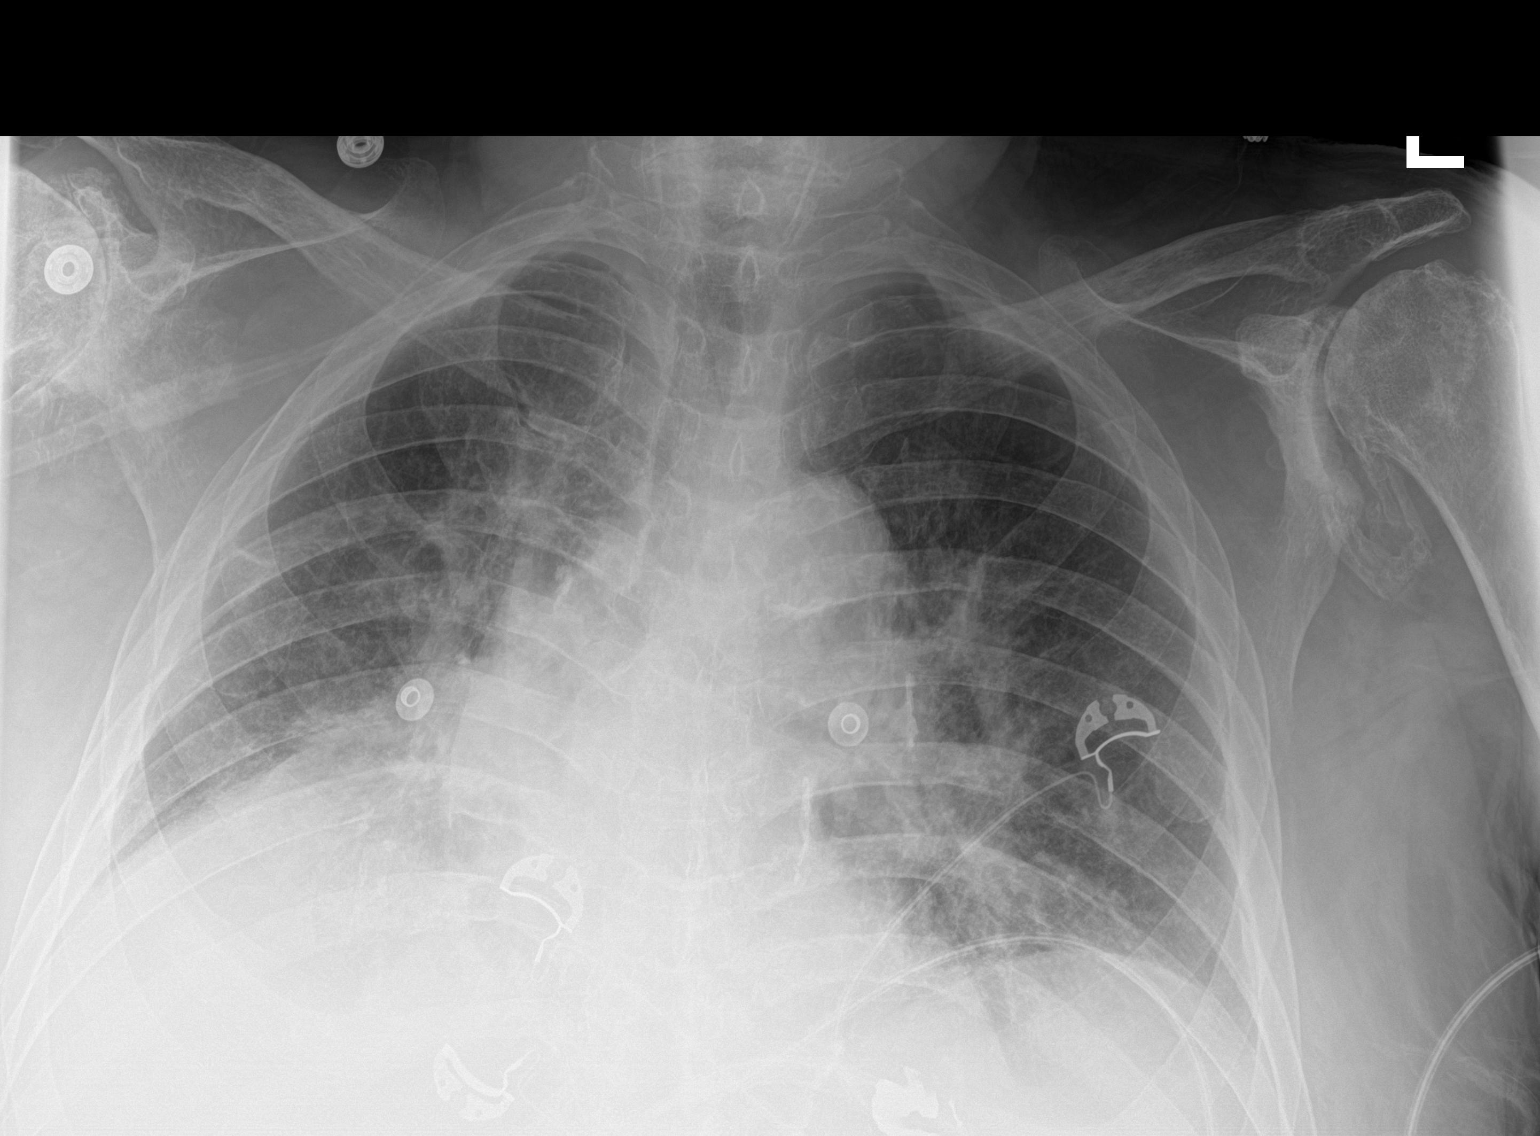

[1 of 1 positions shown; findings below may reference images not displayed]

FINDINGS: Lung volumes are low. Patchy ill-defined opacities are noted
throughout the perihilar aspects of the lungs and medial aspects of
the lower lobes of the lungs bilaterally (right greater than left).
No definite pleural effusions. No pneumothorax. No evidence of
pulmonary edema. Heart size appears normal. Mediastinal contours are
distorted by patient positioning. Atherosclerotic calcifications in
the thoracic aorta.
IMPRESSION: 1. Low lung volumes with multilobar bilateral bronchopneumonia,
worsened compared to the prior examination, as above.
2. Aortic atherosclerosis.
